# Patient Record
Sex: Female | Born: 1945 | Race: White | Hispanic: No | Marital: Married | Smoking: Never smoker
Health system: Southern US, Community
[De-identification: ages and names within clinical notes are randomized; demographics above are authoritative.]

## PROBLEM LIST (undated history)

## (undated) DIAGNOSIS — I10 Essential (primary) hypertension: Secondary | ICD-10-CM

## (undated) DIAGNOSIS — T7840XA Allergy, unspecified, initial encounter: Secondary | ICD-10-CM

## (undated) DIAGNOSIS — E785 Hyperlipidemia, unspecified: Secondary | ICD-10-CM

## (undated) DIAGNOSIS — K219 Gastro-esophageal reflux disease without esophagitis: Secondary | ICD-10-CM

## (undated) DIAGNOSIS — K529 Noninfective gastroenteritis and colitis, unspecified: Secondary | ICD-10-CM

## (undated) DIAGNOSIS — G454 Transient global amnesia: Secondary | ICD-10-CM

## (undated) HISTORY — DX: Noninfective gastroenteritis and colitis, unspecified: K52.9

## (undated) HISTORY — DX: Essential (primary) hypertension: I10

## (undated) HISTORY — PX: TONSILLECTOMY: SUR1361

## (undated) HISTORY — DX: Transient global amnesia: G45.4

## (undated) HISTORY — DX: Gastro-esophageal reflux disease without esophagitis: K21.9

## (undated) HISTORY — PX: CATARACT EXTRACTION: SUR2

## (undated) HISTORY — DX: Allergy, unspecified, initial encounter: T78.40XA

## (undated) HISTORY — DX: Hyperlipidemia, unspecified: E78.5

---

## 1998-03-01 ENCOUNTER — Other Ambulatory Visit: Admission: RE | Admit: 1998-03-01 | Discharge: 1998-03-01 | Payer: Self-pay | Admitting: Gynecology

## 1999-05-29 ENCOUNTER — Other Ambulatory Visit: Admission: RE | Admit: 1999-05-29 | Discharge: 1999-05-29 | Payer: Self-pay | Admitting: Gynecology

## 1999-06-26 ENCOUNTER — Other Ambulatory Visit: Admission: RE | Admit: 1999-06-26 | Discharge: 1999-06-26 | Payer: Self-pay | Admitting: Gynecology

## 1999-06-26 ENCOUNTER — Encounter (INDEPENDENT_AMBULATORY_CARE_PROVIDER_SITE_OTHER): Payer: Self-pay

## 2000-05-28 ENCOUNTER — Other Ambulatory Visit: Admission: RE | Admit: 2000-05-28 | Discharge: 2000-05-28 | Payer: Self-pay | Admitting: Gynecology

## 2000-08-17 ENCOUNTER — Encounter: Payer: Self-pay | Admitting: Occupational Medicine

## 2000-08-17 ENCOUNTER — Encounter: Admission: RE | Admit: 2000-08-17 | Discharge: 2000-08-17 | Payer: Self-pay | Admitting: Occupational Medicine

## 2000-11-02 ENCOUNTER — Other Ambulatory Visit: Admission: RE | Admit: 2000-11-02 | Discharge: 2000-11-02 | Payer: Self-pay | Admitting: Gynecology

## 2001-08-18 ENCOUNTER — Encounter: Payer: Self-pay | Admitting: Occupational Medicine

## 2001-08-18 ENCOUNTER — Encounter: Admission: RE | Admit: 2001-08-18 | Discharge: 2001-08-18 | Payer: Self-pay | Admitting: Occupational Medicine

## 2002-08-31 ENCOUNTER — Encounter: Payer: Self-pay | Admitting: Occupational Medicine

## 2002-08-31 ENCOUNTER — Encounter: Admission: RE | Admit: 2002-08-31 | Discharge: 2002-08-31 | Payer: Self-pay | Admitting: Occupational Medicine

## 2002-10-04 ENCOUNTER — Other Ambulatory Visit: Admission: RE | Admit: 2002-10-04 | Discharge: 2002-10-04 | Payer: Self-pay | Admitting: Gynecology

## 2003-11-06 ENCOUNTER — Encounter: Admission: RE | Admit: 2003-11-06 | Discharge: 2003-11-06 | Payer: Self-pay | Admitting: Family Medicine

## 2003-11-21 ENCOUNTER — Observation Stay (HOSPITAL_COMMUNITY): Admission: EM | Admit: 2003-11-21 | Discharge: 2003-11-22 | Payer: Self-pay | Admitting: Emergency Medicine

## 2003-11-22 ENCOUNTER — Encounter (INDEPENDENT_AMBULATORY_CARE_PROVIDER_SITE_OTHER): Payer: Self-pay | Admitting: Specialist

## 2004-11-20 ENCOUNTER — Ambulatory Visit: Payer: Self-pay | Admitting: Family Medicine

## 2004-11-26 ENCOUNTER — Ambulatory Visit: Payer: Self-pay | Admitting: Family Medicine

## 2004-12-31 ENCOUNTER — Ambulatory Visit: Payer: Self-pay | Admitting: Family Medicine

## 2005-01-14 ENCOUNTER — Encounter: Admission: RE | Admit: 2005-01-14 | Discharge: 2005-01-14 | Payer: Self-pay | Admitting: Family Medicine

## 2005-10-30 ENCOUNTER — Ambulatory Visit: Payer: Self-pay | Admitting: Internal Medicine

## 2006-01-19 ENCOUNTER — Other Ambulatory Visit: Admission: RE | Admit: 2006-01-19 | Discharge: 2006-01-19 | Payer: Self-pay | Admitting: Gynecology

## 2006-01-19 ENCOUNTER — Encounter: Admission: RE | Admit: 2006-01-19 | Discharge: 2006-01-19 | Payer: Self-pay | Admitting: Gynecology

## 2006-01-29 ENCOUNTER — Ambulatory Visit: Payer: Self-pay | Admitting: Family Medicine

## 2006-01-30 ENCOUNTER — Ambulatory Visit: Payer: Self-pay | Admitting: Family Medicine

## 2007-02-11 ENCOUNTER — Encounter: Admission: RE | Admit: 2007-02-11 | Discharge: 2007-02-11 | Payer: Self-pay | Admitting: Gynecology

## 2007-02-17 ENCOUNTER — Other Ambulatory Visit: Admission: RE | Admit: 2007-02-17 | Discharge: 2007-02-17 | Payer: Self-pay | Admitting: Gynecology

## 2007-02-19 ENCOUNTER — Encounter: Admission: RE | Admit: 2007-02-19 | Discharge: 2007-02-19 | Payer: Self-pay | Admitting: Gynecology

## 2007-03-04 ENCOUNTER — Ambulatory Visit: Payer: Self-pay | Admitting: Family Medicine

## 2007-03-12 ENCOUNTER — Ambulatory Visit: Payer: Self-pay | Admitting: Family Medicine

## 2007-03-12 LAB — CONVERTED CEMR LAB
ALT: 21 units/L (ref 0–40)
Alkaline Phosphatase: 57 units/L (ref 39–117)
BUN: 18 mg/dL (ref 6–23)
Bilirubin, Direct: 0.1 mg/dL (ref 0.0–0.3)
Calcium: 9.2 mg/dL (ref 8.4–10.5)
GFR calc Af Amer: 94 mL/min
GFR calc non Af Amer: 78 mL/min
Glucose, Bld: 99 mg/dL (ref 70–99)
Total CHOL/HDL Ratio: 3.7
Triglycerides: 135 mg/dL (ref 0–149)
VLDL: 27 mg/dL (ref 0–40)

## 2007-05-31 ENCOUNTER — Ambulatory Visit: Payer: Self-pay | Admitting: Family Medicine

## 2007-05-31 DIAGNOSIS — G43009 Migraine without aura, not intractable, without status migrainosus: Secondary | ICD-10-CM | POA: Insufficient documentation

## 2007-05-31 DIAGNOSIS — E785 Hyperlipidemia, unspecified: Secondary | ICD-10-CM

## 2007-05-31 DIAGNOSIS — I10 Essential (primary) hypertension: Secondary | ICD-10-CM | POA: Insufficient documentation

## 2007-05-31 DIAGNOSIS — J309 Allergic rhinitis, unspecified: Secondary | ICD-10-CM | POA: Insufficient documentation

## 2007-06-02 ENCOUNTER — Encounter: Payer: Self-pay | Admitting: Family Medicine

## 2007-08-11 ENCOUNTER — Telehealth (INDEPENDENT_AMBULATORY_CARE_PROVIDER_SITE_OTHER): Payer: Self-pay | Admitting: *Deleted

## 2007-09-08 ENCOUNTER — Ambulatory Visit: Payer: Self-pay | Admitting: Family Medicine

## 2007-09-09 LAB — CONVERTED CEMR LAB
AST: 21 units/L (ref 0–37)
Alkaline Phosphatase: 61 units/L (ref 39–117)
Cholesterol: 162 mg/dL (ref 0–200)
Total Bilirubin: 0.7 mg/dL (ref 0.3–1.2)
Total CHOL/HDL Ratio: 2.8
Total Protein: 6.5 g/dL (ref 6.0–8.3)
Triglycerides: 141 mg/dL (ref 0–149)

## 2007-09-10 ENCOUNTER — Telehealth (INDEPENDENT_AMBULATORY_CARE_PROVIDER_SITE_OTHER): Payer: Self-pay | Admitting: *Deleted

## 2007-12-27 ENCOUNTER — Encounter: Payer: Self-pay | Admitting: Family Medicine

## 2008-02-25 ENCOUNTER — Encounter: Admission: RE | Admit: 2008-02-25 | Discharge: 2008-02-25 | Payer: Self-pay | Admitting: Family Medicine

## 2008-02-29 ENCOUNTER — Encounter (INDEPENDENT_AMBULATORY_CARE_PROVIDER_SITE_OTHER): Payer: Self-pay | Admitting: *Deleted

## 2008-03-23 ENCOUNTER — Ambulatory Visit: Payer: Self-pay | Admitting: Internal Medicine

## 2008-04-13 ENCOUNTER — Other Ambulatory Visit: Admission: RE | Admit: 2008-04-13 | Discharge: 2008-04-13 | Payer: Self-pay | Admitting: Gynecology

## 2008-07-31 ENCOUNTER — Ambulatory Visit: Payer: Self-pay | Admitting: Family Medicine

## 2008-07-31 DIAGNOSIS — R5383 Other fatigue: Secondary | ICD-10-CM

## 2008-07-31 DIAGNOSIS — R5381 Other malaise: Secondary | ICD-10-CM

## 2008-08-08 ENCOUNTER — Encounter (INDEPENDENT_AMBULATORY_CARE_PROVIDER_SITE_OTHER): Payer: Self-pay | Admitting: *Deleted

## 2008-11-23 IMAGING — MG MM SCREEN MAMMOGRAM BILATERAL
4 series · 4 of 4 positions shown · non-contrast
Comparison: none

DG SCREEN MAMMOGRAM BILATERAL
Bilateral CC and MLO view(s) were taken.
Technologist: Alabarca, Ayleen(MARQUIS)

DIGITAL SCREENING MAMMOGRAM WITH CAD:
There is a dense  fibroglandular pattern.  A possible mass is noted in the right breast.  Spot 
compression views and possibly sonography are recommended for further evaluation.  In the left 
breast, no masses or malignant type calcifications are identified.  Compared with prior studies.

[R CC]
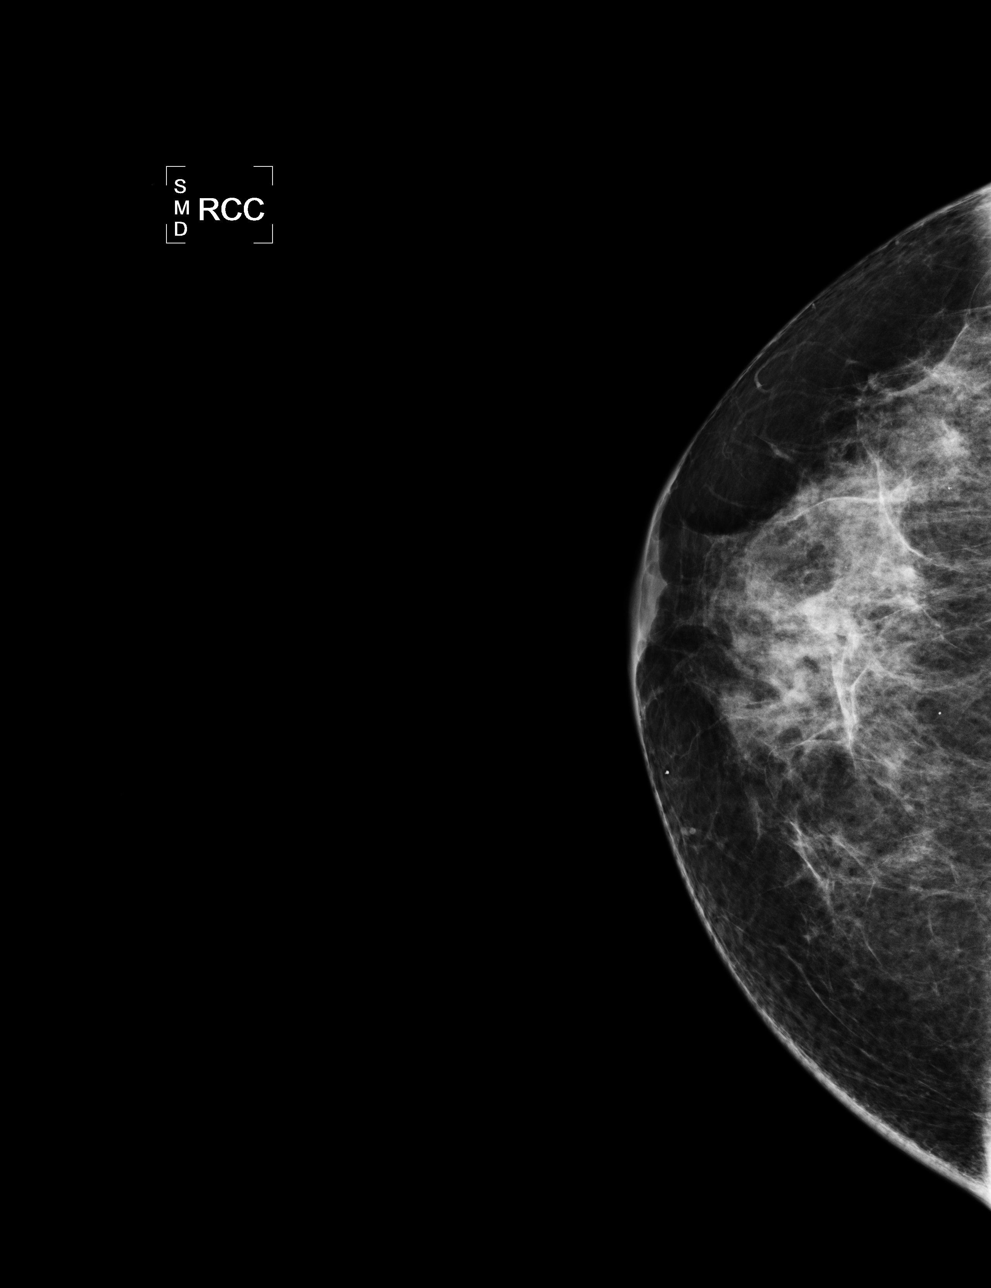

[L CC]
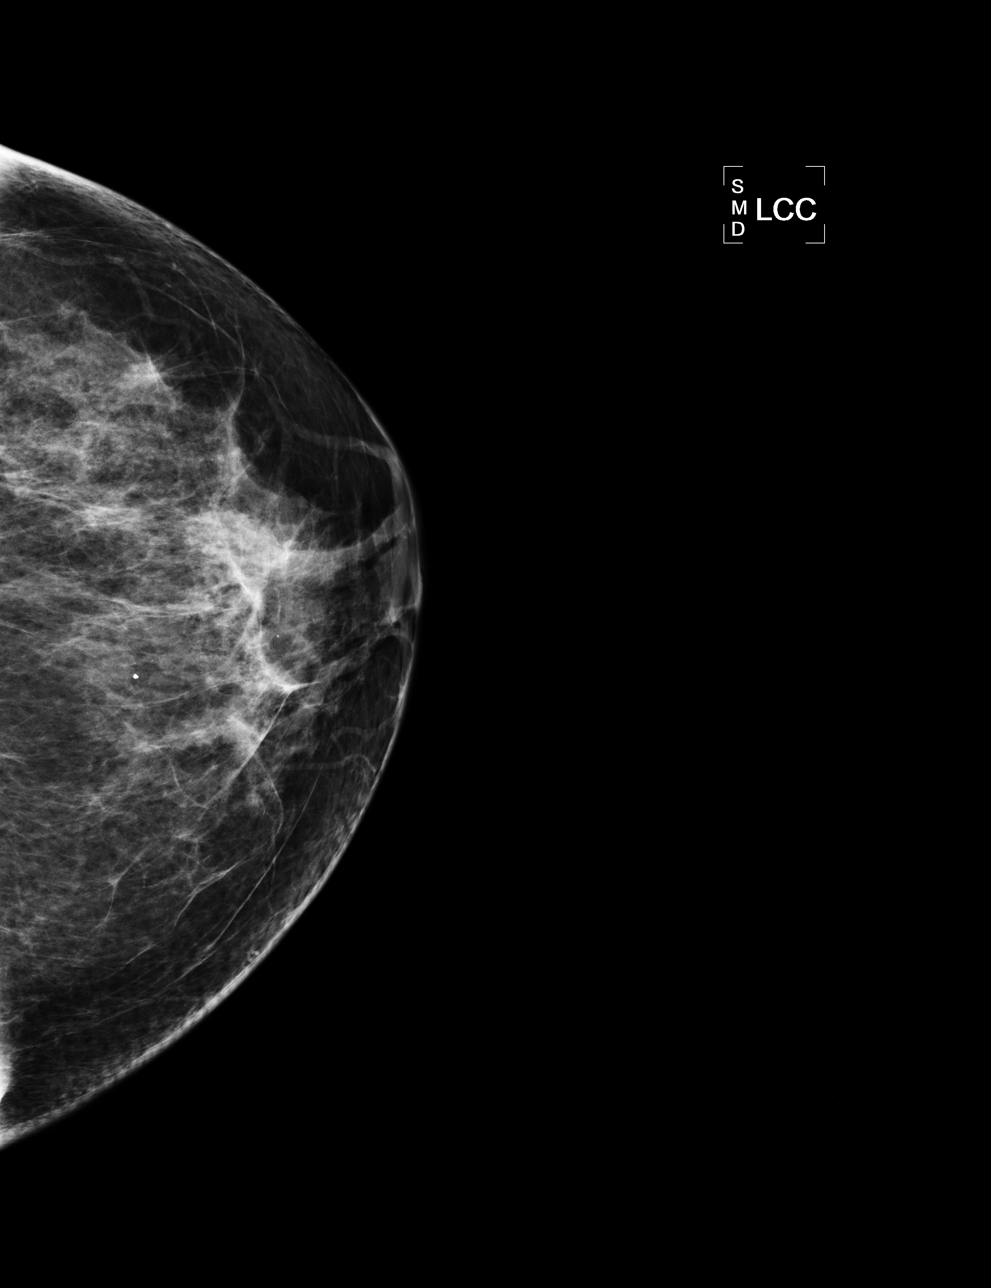

[L MLO]
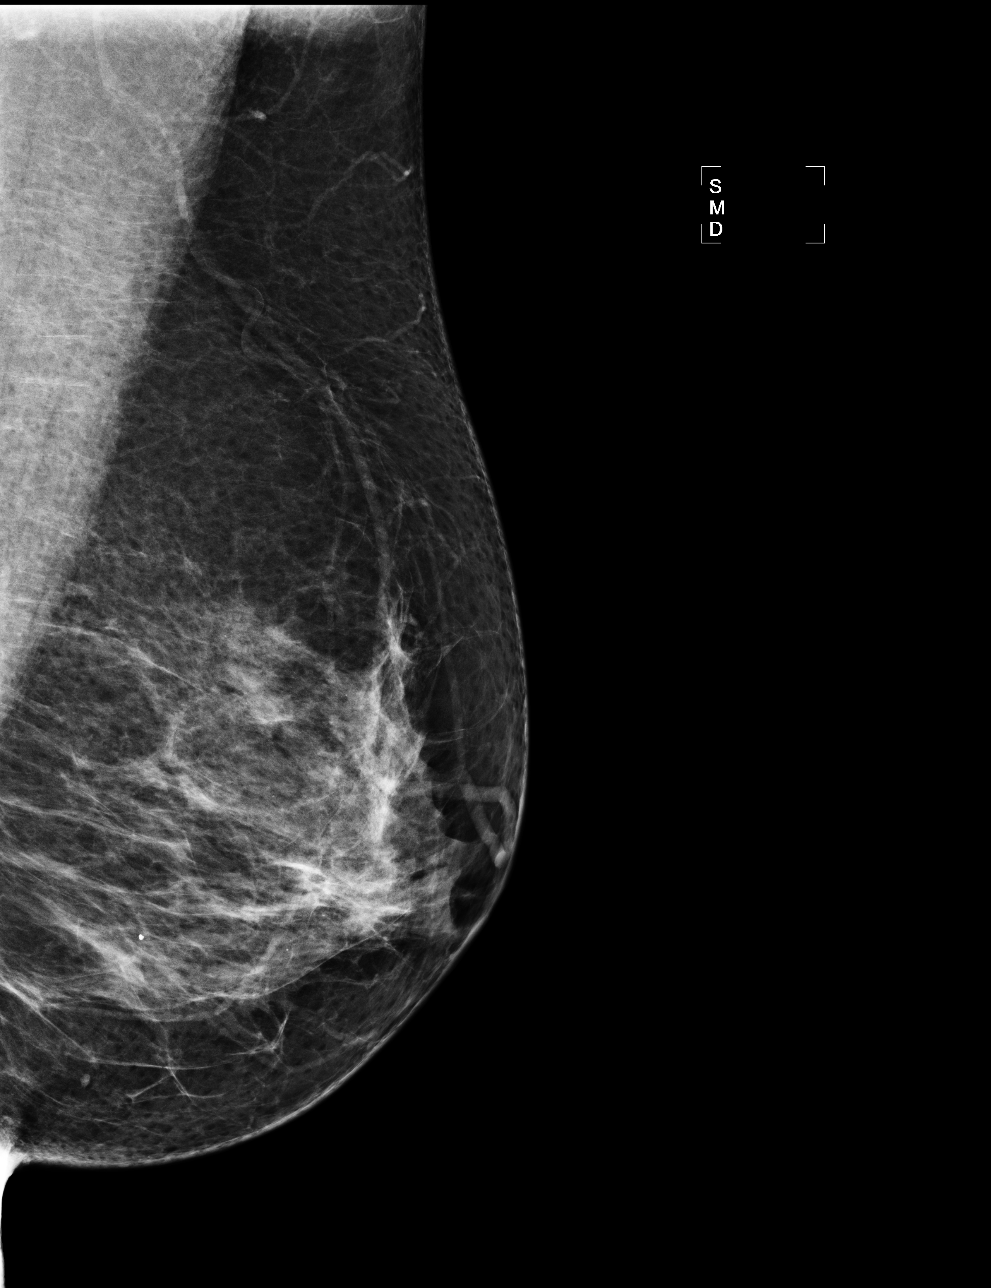

[R MLO]
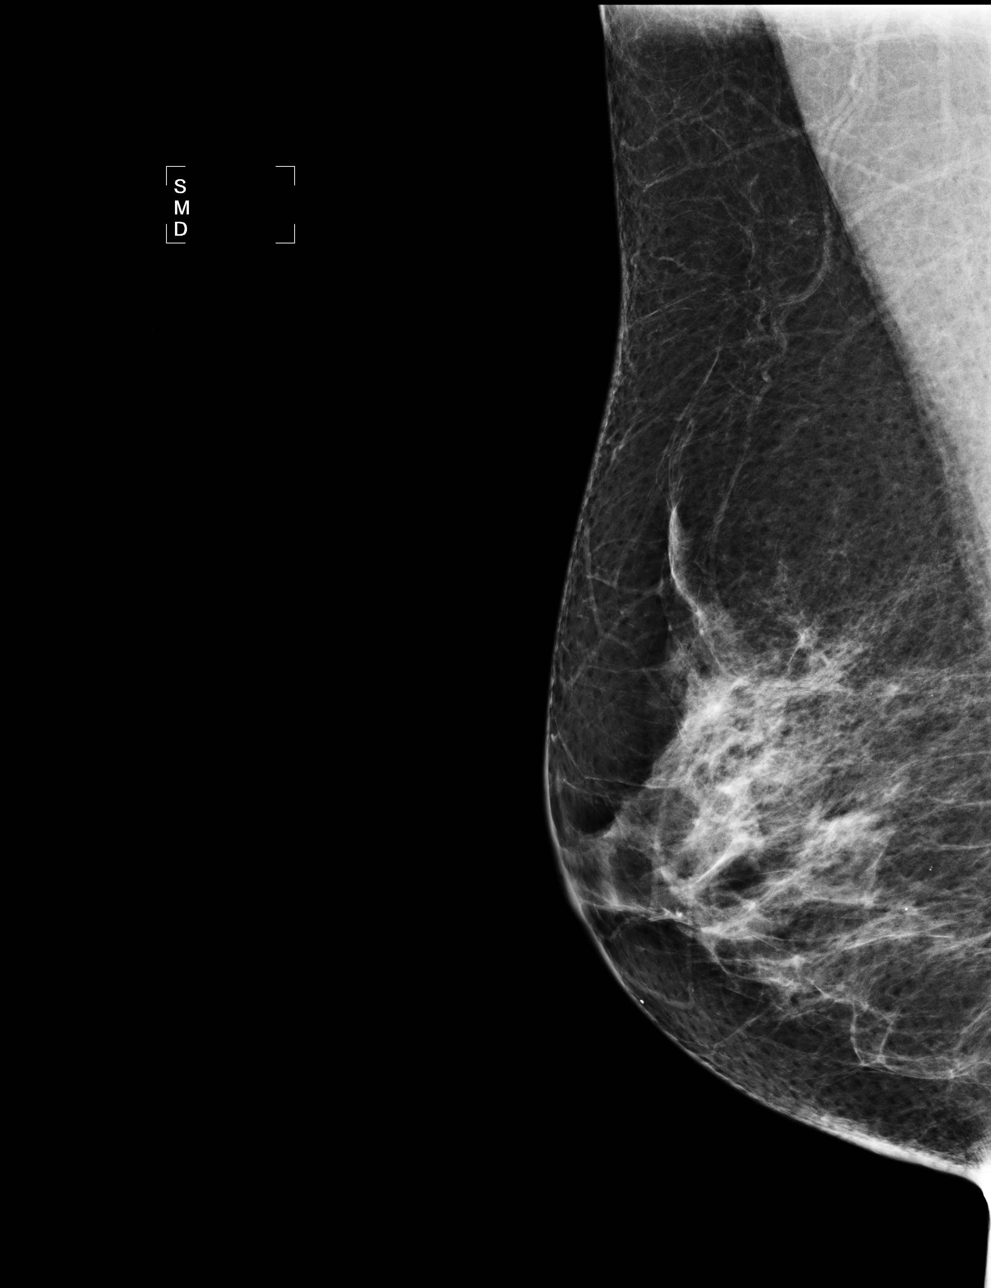

[4 of 4 positions shown; findings below may reference images not displayed]

IMPRESSION: Possible mass, right breast.  Additional evaluation is indicated.  The patient will be contacted 
for additional studies and a supplementary report will follow.  No specific mammographic evidence 
of malignancy, left breast.

ASSESSMENT: Need additional imaging evaluation and/or prior mammograms for comparison - BI-RADS 0

Further imaging of the right breast.
ANALYZED BY COMPUTER AIDED DETECTION. , THIS PROCEDURE WAS A DIGITAL MAMMOGRAM.

## 2008-11-28 ENCOUNTER — Telehealth (INDEPENDENT_AMBULATORY_CARE_PROVIDER_SITE_OTHER): Payer: Self-pay | Admitting: *Deleted

## 2009-03-02 ENCOUNTER — Telehealth (INDEPENDENT_AMBULATORY_CARE_PROVIDER_SITE_OTHER): Payer: Self-pay | Admitting: *Deleted

## 2009-03-07 ENCOUNTER — Encounter: Admission: RE | Admit: 2009-03-07 | Discharge: 2009-03-07 | Payer: Self-pay | Admitting: Gynecology

## 2009-04-16 ENCOUNTER — Ambulatory Visit: Payer: Self-pay | Admitting: Family Medicine

## 2009-04-18 LAB — CONVERTED CEMR LAB
ALT: 20 units/L (ref 0–35)
Bilirubin, Direct: 0.1 mg/dL (ref 0.0–0.3)
HDL: 63.2 mg/dL (ref >39.00)
LDL Cholesterol: 76 mg/dL (ref 0–99)
Total Bilirubin: 0.9 mg/dL (ref 0.3–1.2)
Total CHOL/HDL Ratio: 3
VLDL: 26.4 mg/dL (ref 0.0–40.0)

## 2009-04-19 ENCOUNTER — Encounter (INDEPENDENT_AMBULATORY_CARE_PROVIDER_SITE_OTHER): Payer: Self-pay | Admitting: *Deleted

## 2009-07-10 ENCOUNTER — Telehealth (INDEPENDENT_AMBULATORY_CARE_PROVIDER_SITE_OTHER): Payer: Self-pay | Admitting: *Deleted

## 2009-08-03 ENCOUNTER — Ambulatory Visit: Payer: Self-pay | Admitting: Family Medicine

## 2009-08-13 ENCOUNTER — Encounter: Payer: Self-pay | Admitting: Family Medicine

## 2009-09-03 ENCOUNTER — Telehealth (INDEPENDENT_AMBULATORY_CARE_PROVIDER_SITE_OTHER): Payer: Self-pay | Admitting: *Deleted

## 2009-10-10 ENCOUNTER — Telehealth (INDEPENDENT_AMBULATORY_CARE_PROVIDER_SITE_OTHER): Payer: Self-pay | Admitting: *Deleted

## 2009-10-22 ENCOUNTER — Telehealth (INDEPENDENT_AMBULATORY_CARE_PROVIDER_SITE_OTHER): Payer: Self-pay | Admitting: *Deleted

## 2009-11-16 ENCOUNTER — Ambulatory Visit: Payer: Self-pay | Admitting: Family Medicine

## 2009-11-27 ENCOUNTER — Telehealth (INDEPENDENT_AMBULATORY_CARE_PROVIDER_SITE_OTHER): Payer: Self-pay | Admitting: *Deleted

## 2010-02-15 ENCOUNTER — Ambulatory Visit: Payer: Self-pay | Admitting: Family Medicine

## 2010-02-18 LAB — CONVERTED CEMR LAB
Albumin: 4.1 g/dL (ref 3.5–5.2)
Alkaline Phosphatase: 57 units/L (ref 39–117)
Chloride: 103 meq/L (ref 96–112)
Cholesterol: 161 mg/dL (ref 0–200)
Creatinine, Ser: 0.8 mg/dL (ref 0.4–1.2)
HDL: 65.9 mg/dL (ref >39.00)
LDL Cholesterol: 65 mg/dL (ref 0–99)
Potassium: 3.8 meq/L (ref 3.5–5.1)
Sodium: 141 meq/L (ref 135–145)
Total Protein: 7 g/dL (ref 6.0–8.3)
Triglycerides: 150 mg/dL — ABNORMAL HIGH (ref 0.0–149.0)
VLDL: 30 mg/dL (ref 0.0–40.0)

## 2010-03-12 ENCOUNTER — Encounter: Admission: RE | Admit: 2010-03-12 | Discharge: 2010-03-12 | Payer: Self-pay | Admitting: Family Medicine

## 2010-07-12 ENCOUNTER — Encounter (INDEPENDENT_AMBULATORY_CARE_PROVIDER_SITE_OTHER): Payer: Self-pay | Admitting: *Deleted

## 2010-08-22 ENCOUNTER — Encounter: Payer: Self-pay | Admitting: Family Medicine

## 2010-08-22 ENCOUNTER — Ambulatory Visit: Payer: Self-pay | Admitting: Family Medicine

## 2010-08-28 ENCOUNTER — Ambulatory Visit: Payer: Self-pay | Admitting: Family Medicine

## 2010-08-28 LAB — CONVERTED CEMR LAB
ALT: 22 units/L (ref 0–35)
AST: 24 units/L (ref 0–37)
Albumin: 4.1 g/dL (ref 3.5–5.2)
BUN: 16 mg/dL (ref 6–23)
Basophils Relative: 0.5 % (ref 0.0–3.0)
Chloride: 100 meq/L (ref 96–112)
Eosinophils Relative: 2.2 % (ref 0.0–5.0)
Glucose, Bld: 94 mg/dL (ref 70–99)
HDL: 60.4 mg/dL (ref >39.00)
Hemoglobin: 12.3 g/dL (ref 12.0–15.0)
LDL Cholesterol: 80 mg/dL (ref 0–99)
MCV: 84.7 fL (ref 78.0–100.0)
Monocytes Absolute: 0.4 10*3/uL (ref 0.1–1.0)
Neutrophils Relative %: 60.5 % (ref 43.0–77.0)
Potassium: 4 meq/L (ref 3.5–5.1)
RBC: 4.15 M/uL (ref 3.87–5.11)
TSH: 1.81 microintl units/mL (ref 0.35–5.50)
Total CHOL/HDL Ratio: 3
Triglycerides: 160 mg/dL — ABNORMAL HIGH (ref 0.0–149.0)
VLDL: 32 mg/dL (ref 0.0–40.0)
WBC: 4.7 10*3/uL (ref 4.5–10.5)

## 2010-11-24 ENCOUNTER — Encounter: Payer: Self-pay | Admitting: Gynecology

## 2010-12-01 LAB — CONVERTED CEMR LAB
ALT: 20 units/L (ref 0–35)
AST: 21 units/L (ref 0–37)
Albumin: 4.1 g/dL (ref 3.5–5.2)
Albumin: 4.1 g/dL (ref 3.5–5.2)
Alkaline Phosphatase: 60 units/L (ref 39–117)
Alkaline Phosphatase: 66 units/L (ref 39–117)
BUN: 13 mg/dL (ref 6–23)
BUN: 16 mg/dL (ref 6–23)
BUN: 17 mg/dL (ref 6–23)
Basophils Absolute: 0 10*3/uL (ref 0.0–0.1)
Basophils Absolute: 0 10*3/uL (ref 0.0–0.1)
Basophils Absolute: 0 10*3/uL (ref 0.0–0.1)
Basophils Relative: 0.6 % (ref 0.0–3.0)
Bilirubin Urine: NEGATIVE
Bilirubin Urine: NEGATIVE
Bilirubin, Direct: 0 mg/dL (ref 0.0–0.3)
Bilirubin, Direct: 0.1 mg/dL (ref 0.0–0.3)
CO2: 31 meq/L (ref 19–32)
Chloride: 105 meq/L (ref 96–112)
Chloride: 111 meq/L (ref 96–112)
Cholesterol: 170 mg/dL (ref 0–200)
Cholesterol: 174 mg/dL (ref 0–200)
Cholesterol: 224 mg/dL (ref 0–200)
Creatinine, Ser: 0.8 mg/dL (ref 0.4–1.2)
Direct LDL: 134.6 mg/dL
Eosinophils Absolute: 0.1 10*3/uL (ref 0.0–0.6)
Eosinophils Absolute: 0.1 10*3/uL (ref 0.0–0.7)
Eosinophils Relative: 2.3 % (ref 0.0–5.0)
GFR calc Af Amer: 110 mL/min
GFR calc Af Amer: 82 mL/min
GFR calc non Af Amer: 91 mL/min
Glucose, Bld: 106 mg/dL — ABNORMAL HIGH (ref 70–99)
Glucose, Bld: 91 mg/dL (ref 70–99)
Glucose, Urine, Semiquant: NEGATIVE
Glucose, Urine, Semiquant: NEGATIVE
HCT: 37.3 % (ref 36.0–46.0)
Hemoglobin: 12.9 g/dL (ref 12.0–15.0)
Hemoglobin: 12.9 g/dL (ref 12.0–15.0)
Ketones, urine, test strip: NEGATIVE
LDL Cholesterol: 79 mg/dL (ref 0–99)
Lymphocytes Relative: 24.1 % (ref 12.0–46.0)
Lymphocytes Relative: 25.5 % (ref 12.0–46.0)
Lymphs Abs: 1.2 10*3/uL (ref 0.7–4.0)
MCHC: 34.4 g/dL (ref 30.0–36.0)
MCHC: 34.6 g/dL (ref 30.0–36.0)
MCV: 85.3 fL (ref 78.0–100.0)
MCV: 86 fL (ref 78.0–100.0)
Monocytes Absolute: 0.4 10*3/uL (ref 0.1–1.0)
Monocytes Absolute: 0.4 10*3/uL (ref 0.2–0.7)
Monocytes Relative: 8.9 % (ref 3.0–11.0)
Neutro Abs: 2.5 10*3/uL (ref 1.4–7.7)
Neutro Abs: 3.1 10*3/uL (ref 1.4–7.7)
Neutrophils Relative %: 61.9 % (ref 43.0–77.0)
Neutrophils Relative %: 63.9 % (ref 43.0–77.0)
Pap Smear: NORMAL
Platelets: 214 10*3/uL (ref 150.0–400.0)
Potassium: 3.6 meq/L (ref 3.5–5.1)
Potassium: 3.9 meq/L (ref 3.5–5.1)
Potassium: 4 meq/L (ref 3.5–5.1)
Protein, U semiquant: NEGATIVE
Protein, U semiquant: NEGATIVE
RBC: 4.41 M/uL (ref 3.87–5.11)
RDW: 13 % (ref 11.5–14.6)
RDW: 13.1 % (ref 11.5–14.6)
Sodium: 143 meq/L (ref 135–145)
TSH: 1.87 microintl units/mL (ref 0.35–5.50)
Total Bilirubin: 0.8 mg/dL (ref 0.3–1.2)
Total CHOL/HDL Ratio: 4
Total Protein: 6.8 g/dL (ref 6.0–8.3)
Triglycerides: 153 mg/dL — ABNORMAL HIGH (ref 0.0–149.0)
Urobilinogen, UA: NEGATIVE
VLDL: 30.6 mg/dL (ref 0.0–40.0)
Vit D, 1,25-Dihydroxy: 28 — ABNORMAL LOW (ref 30–89)
WBC: 4.7 10*3/uL (ref 4.5–10.5)
pH: 5
pH: 5

## 2010-12-05 NOTE — Assessment & Plan Note (Signed)
Summary: FU/BP/KDC   Vital Signs:  Patient profile:   65 year old female Weight:      193 pounds Temp:     98.2 degrees F oral Pulse rate:   80 / minute Pulse rhythm:   regular BP sitting:   122 / 84  (left arm) Cuff size:   large  Vitals Entered By: Army Fossa CMA (November 16, 2009 2:11 PM) CC: follow up on BP meds    History of Present Illness:  Hypertension follow-up      This is a 65 year old woman who presents for Hypertension follow-up.  The patient denies lightheadedness, urinary frequency, headaches, edema, impotence, rash, and fatigue.  The patient denies the following associated symptoms: chest pain, chest pressure, exercise intolerance, dyspnea, palpitations, syncope, leg edema, and pedal edema.  Compliance with medications (by patient report) has been near 100%.  The patient reports that dietary compliance has been good.  The patient reports exercising occasionally.  Adjunctive measures currently used by the patient include salt restriction.    Current Medications (verified): 1)  Diovan Hct 80-12.5 Mg Tabs (Valsartan-Hydrochlorothiazide) .... Take 1 By Mouth Once Daily 2)  Xyzal 5 Mg  Tabs (Levocetirizine Dihydrochloride) .Marland Kitchen.. 1 By Mouth Once Daily 3)  Wellbutrin Xl 300 Mg  Tb24 (Bupropion Hcl) .Marland Kitchen.. 1 Once Daily 4)  Imitrex 100 Mg  Tabs (Sumatriptan Succinate) .... As Needed 5)  Zocor 40 Mg  Tabs (Simvastatin) .Marland Kitchen.. 1 By Mouth At Bedtime- Due Labs 6)  Kapidex 60 Mg Cpdr (Dexlansoprazole) .Marland Kitchen.. 1 By Mouth Once Daily 7)  Tricor 145 Mg Tabs (Fenofibrate) .... Take One Tablet Daily 8)  Vitamin D 86578 Unit Caps (Ergocalciferol) .... Take One Capsule Weekly. 9)  Allegra 180 Mg Tabs (Fexofenadine Hcl) .... Take 1 By Mouth Once Daily  Allergies (verified): No Known Drug Allergies  Past History:  Past Medical History: Last updated: 07/31/2008 Hyperlipidemia Hypertension Allergic rhinitis Current Problems:  ALLERGIC RHINITIS (ICD-477.9) FAMILY HISTORY DIABETES 1ST  DEGREE RELATIVE (ICD-V18.0) FAMILY HISTORY OF CAD FEMALE 1ST DEGREE RELATIVE <60 (ICD-V16.49) COMMON MIGRAINE (ICD-346.10) HYPERTENSION (ICD-401.9) HYPERLIPIDEMIA (ICD-272.4)  Past Surgical History: Last updated: 07/31/2008  Cataract extraction--- B/L  Family History: Last updated: 05/31/2007 Family History of CAD Female 1st degree relative <60 Family History Diabetes 1st degree relative Family History High cholesterol Family History Hypertension PGM- breast CA in 51s  Social History: Last updated: 05/31/2007 Occupation: Event organiser apt. housing Married Never Smoked Alcohol use-no Drug use-no Regular exercise-yes  Risk Factors: Alcohol Use: 0 (08/03/2009) Caffeine Use: 1 (08/03/2009) Exercise: yes (08/03/2009)  Risk Factors: Smoking Status: never (08/03/2009) Passive Smoke Exposure: no (05/31/2007)  Review of Systems      See HPI  Physical Exam  General:  Well-developed,well-nourished,in no acute distress; alert,appropriate and cooperative throughout examination Lungs:  Normal respiratory effort, chest expands symmetrically. Lungs are clear to auscultation, no crackles or wheezes. Heart:  normal rate and no murmur.   Extremities:  No clubbing, cyanosis, edema, or deformity noted with normal full range of motion of all joints.   Psych:  Oriented X3 and normally interactive.     Impression & Recommendations:  Problem # 1:  HYPERTENSION (ICD-401.9)  Her updated medication list for this problem includes:    Diovan Hct 80-12.5 Mg Tabs (Valsartan-hydrochlorothiazide) .Marland Kitchen... Take 1 by mouth once daily  BP today: 122/84 Prior BP: 140/90 (08/03/2009)  Labs Reviewed: K+: 3.6 (08/03/2009) Creat: : 0.8 (08/03/2009)   Chol: 174 (08/03/2009)   HDL: 59.80 (08/03/2009)   LDL: 84 (08/03/2009)  TG: 153.0 (08/03/2009)  Complete Medication List: 1)  Diovan Hct 80-12.5 Mg Tabs (Valsartan-hydrochlorothiazide) .... Take 1 by mouth once daily 2)  Xyzal 5 Mg Tabs (Levocetirizine  dihydrochloride) .Marland Kitchen.. 1 by mouth once daily 3)  Wellbutrin Xl 300 Mg Tb24 (Bupropion hcl) .Marland Kitchen.. 1 once daily 4)  Imitrex 100 Mg Tabs (Sumatriptan succinate) .... As needed 5)  Zocor 40 Mg Tabs (Simvastatin) .Marland Kitchen.. 1 by mouth at bedtime- due labs 6)  Kapidex 60 Mg Cpdr (Dexlansoprazole) .Marland Kitchen.. 1 by mouth once daily 7)  Tricor 145 Mg Tabs (Fenofibrate) .... Take one tablet daily 8)  Vitamin D 04540 Unit Caps (Ergocalciferol) .... Take one capsule weekly. 9)  Allegra 180 Mg Tabs (Fexofenadine hcl) .... Take 1 by mouth once daily  Other Orders: Zoster (Shingles) Vaccine Live [phone]) Admin 1st Vaccine (14782)  Patient Instructions: 1)  rto 3 months Prescriptions: DIOVAN HCT 80-12.5 MG TABS (VALSARTAN-HYDROCHLOROTHIAZIDE) Take 1 by mouth once daily  #90 x 3   Entered and Authorized by:   Loreen Freud DO   Signed by:   Loreen Freud DO on 11/16/2009   Method used:   [facility]        [facility]  [address] [phone][facility] (retail[facility]       [address]       [place], [place]  [id number]       Ph: [phone] or [phone]       Fax: [phone]   RxID:   [id number]    Immunizations Administered:  Zostavax # 1:    Vaccine Type: Zostavax    Site: left deltoid    Mfr: Merck    Dose: 0.5 ml    Route: [place]    Given by: Army Fossa CMA    Exp. Date: 11/30/2010    Lot #: [id number]

## 2010-12-05 NOTE — Letter (Signed)
Summary: Cancer Screening/[facility] Personalized Risk Profile  Cancer Screening/[facility] Personalized Risk Profile   Imported By: Lanelle Bal 09/02/2010 08:34:00  _____________________________________________________________________  External Attachment:    Type:   Image     Comment:   External Document

## 2010-12-05 NOTE — Progress Notes (Signed)
Summary: nausea, diarrhea  Phone Note Call from Patient   Caller: Patient Summary of Call: pt just recently return for cruise where she develop nausea, vomiting, and diarrhea while on the ship where she was treated. pt now states that the nausea, and diarrhea has returned. pt has tried pepto-bismol with little relief. Offer pt Ov pt decline stating that she feels to bad to come in. Advise pt to try imodium AD and align, and to avoid greasy/fatty food, dairy and to do a clear liquid diet. to see if this will help with her stomach. Inform pt will forward to dr Laury Axon for further advisement................Marland KitchenFelecia Deloach CMA  November 27, 2009 2:20 PM   Follow-up for Phone Call        travelers diarrhea usually requires testing and abx-- pt really should come in. Follow-up by: Loreen Freud DO,  November 27, 2009 4:37 PM  Additional Follow-up for Phone Call Additional follow up Details #1::        left message to call  office..............Marland KitchenFelecia Deloach CMA  November 27, 2009 4:58 PM  left message to call office............Marland KitchenFelecia Deloach CMA  November 28, 2009 2:35 PM     Additional Follow-up for Phone Call Additional follow up Details #2::    pt return call stating that she is much better now all symptoms have resolve but if they return she will come in for OV........................Marland KitchenFelecia Deloach CMA  November 29, 2009 8:12 AM

## 2010-12-05 NOTE — Assessment & Plan Note (Signed)
Summary: CPX///SPH   Vital Signs:  Patient profile:   65 year old female Height:      66.25 inches Weight:      191.6 pounds BMI:     30.80 Pulse rate:   78 / minute BP sitting:   138 / 74  (left arm)  Vitals Entered By: Jeremy Johann CMA (August 22, 2010 12:44 PM) CC: CPX   History of Present Illness: Robin Wall here for cpe and sees Dr Lorenso Courier for gyn exam.  No complaints.    Preventive Screening-Counseling & Management  Alcohol-Tobacco     Alcohol drinks/day: 0     Smoking Status: never     Passive Smoke Exposure: no  Caffeine-Diet-Exercise     Caffeine use/day: 2     Does Patient Exercise: yes     Type of exercise: walking     Exercise (avg: min/session): 30-60     Times/week: 3     Exercise Counseling: to improve exercise regimen  Hep-HIV-STD-Contraception     HIV Risk: no     Dental Visit-last 6 months yes     Dental Care Counseling: not indicated; dental care within six months     SBE monthly: yes     SBE Education/Counseling: not indicated; SBE done regularly     Sun Exposure-Excessive: no  Safety-Violence-Falls     Seat Belt Use: yes      Sexual History:  currently monogamous.    Current Medications (verified): 1)  Diovan Hct 80-12.5 Mg Tabs (Valsartan-Hydrochlorothiazide) .... Take 1 By Mouth Once Daily 2)  Xyzal 5 Mg  Tabs (Levocetirizine Dihydrochloride) .Marland Kitchen.. 1 By Mouth Once Daily 3)  Wellbutrin Xl 300 Mg  Tb24 (Bupropion Hcl) .Marland Kitchen.. 1 Once Daily*office Visit Due Now 4)  Imitrex 100 Mg  Tabs (Sumatriptan Succinate) .... As Needed 5)  Zocor 40 Mg  Tabs (Simvastatin) .Marland Kitchen.. 1 By Mouth At Bedtime. 6)  Flonase 50 Mcg/act Susp (Fluticasone Propionate) .... 2 Sprays Each Nostril Once Daily  Allergies (verified): No Known Drug Allergies  Past History:  Past Medical History: Last updated: 07/31/2008 Hyperlipidemia Hypertension Allergic rhinitis Current Problems:  ALLERGIC RHINITIS (ICD-477.9) FAMILY HISTORY DIABETES 1ST DEGREE RELATIVE (ICD-V18.0) FAMILY  HISTORY OF CAD FEMALE 1ST DEGREE RELATIVE <60 (ICD-V16.49) COMMON MIGRAINE (ICD-346.10) HYPERTENSION (ICD-401.9) HYPERLIPIDEMIA (ICD-272.4)  Past Surgical History: Last updated: 07/31/2008  Cataract extraction--- B/L  Family History: Last updated: 05/31/2007 Family History of CAD Female 1st degree relative <60 Family History Diabetes 1st degree relative Family History High cholesterol Family History Hypertension PGM- breast CA in 49s  Social History: Last updated: 05/31/2007 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 Family History of CAD Female 1st degree relative <60 Family History Diabetes 1st degree relative Family History High cholesterol Family History Hypertension PGM- breast CA in 7s  Social History: Reviewed history from 05/31/2007 and no changes required. 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 Eyes:  Denies blurring, discharge, double vision, eye irritation, eye pain, halos, itching, light sensitivity, red eye, vision loss-1 eye, and  vision loss-both eyes; optho-- q1y. ENT:  Denies decreased hearing, difficulty swallowing, ear discharge, earache, hoarseness, nasal congestion, nosebleeds, postnasal drainage, ringing in ears, sinus pressure, and sore throat. CV:  Denies bluish discoloration of lips or nails, chest pain or discomfort, difficulty breathing at  night, difficulty breathing while lying down, fainting, fatigue, leg cramps with exertion, lightheadness, near fainting, palpitations, shortness of breath with exertion, swelling of feet, swelling of hands, and weight gain. Resp:  Denies chest discomfort, chest pain with inspiration, cough, coughing up blood, excessive snoring, hypersomnolence, morning headaches, pleuritic, shortness of breath, sputum productive, and wheezing. GI:  Complains of abdominal pain; denies bloody stools, change in bowel habits, constipation, dark tarry stools, diarrhea, excessive appetite, gas, hemorrhoids, loss of appetite, and nausea; Robin Wall states her hiatal hernia acts up exertiion. GU:  Denies abnormal vaginal bleeding, decreased libido, discharge, dysuria, genital sores, hematuria, incontinence, nocturia, urinary frequency, and urinary hesitancy. MS:  Denies joint pain, joint redness, joint swelling, loss of strength, low back pain, mid back pain, muscle aches, muscle , cramps, muscle weakness, stiffness, and thoracic pain. Derm:  Denies changes in color of skin, changes in nail beds, dryness, excessive perspiration, flushing, hair loss, insect bite(s), itching, lesion(s), poor wound healing, and rash. Neuro:  Denies brief paralysis, difficulty with concentration, disturbances in coordination, falling down, headaches, inability to speak, memory loss, numbness, poor balance, seizures, sensation of room spinning, tingling, tremors, visual disturbances, and weakness. Psych:  Denies alternate hallucination ( auditory/visual), anxiety, depression, easily angered, easily tearful, irritability, mental problems, panic attacks, sense of great danger, suicidal thoughts/plans, thoughts of violence, unusual visions or sounds, and thoughts /plans of harming others. Endo:  Denies cold intolerance, excessive hunger, excessive thirst, excessive urination, heat intolerance, polyuria, and weight change. Heme:  Denies abnormal bruising, bleeding,  enlarge lymph nodes, fevers, pallor, and skin discoloration. Allergy:  Denies hives or rash, itching eyes, persistent infections, seasonal allergies, and sneezing.  Physical Exam  General:  Well-developed,well-nourished,in no acute distress; alert,appropriate and cooperative throughout examination Head:  Normocephalic and atraumatic without obvious abnormalities. No apparent alopecia or balding. Eyes:  vision grossly intact, pupils equal, pupils round, pupils reactive to light, and no injection.   Ears:  External ear exam shows no significant lesions or deformities.  Otoscopic examination reveals clear canals, tympanic membranes are intact bilaterally without bulging, retraction, inflammation or discharge. Hearing is grossly normal bilaterally. Nose:  External nasal examination shows no deformity or inflammation. Nasal mucosa are pink and moist without lesions or exudates. Mouth:  Oral mucosa and oropharynx without lesions or exudates.  Teeth in good repair. Neck:  No deformities, masses, or tenderness noted. Chest Wall:  No deformities, masses, or tenderness noted. Breasts:  gyn Lungs:  Normal respiratory effort, chest expands symmetrically. Lungs are clear to auscultation, no crackles or wheezes. Heart:  normal rate and no murmur.   Abdomen:  Bowel sounds positive,abdomen soft and non-tender without masses, organomegaly or hernias noted. Rectal:  gyn Genitalia:  gyn Msk:  normal ROM, no joint tenderness, no joint swelling, no joint warmth, no redness over joints, no joint deformities, no joint instability, and no crepitation.   Pulses:  R and L carotid,radial,femoral,dorsalis pedis and posterior tibial pulses are full and equal bilaterally Extremities:  No clubbing, cyanosis, edema, or deformity noted with normal full range of motion of all joints.   Neurologic:  No cranial nerve deficits noted. Station and gait are normal. Plantar reflexes are down-going bilaterally. DTRs are symmetrical  throughout. Sensory, motor and coordinative functions  appear intact. Skin:  Intact without suspicious lesions or rashes Cervical Nodes:  No lymphadenopathy noted Psych:  Cognition and judgment appear intact. Alert and cooperative with normal attention span and concentration. No apparent delusions, illusions, hallucinations   Impression & Recommendations:  Problem # 1:  PREVENTIVE HEALTH CARE (ICD-V70.0)  rto for labs ghm utd   Orders: EKG w/ Interpretation (93000)  Problem # 2:  HIATAL HERNIA (ICD-553.3)  Problem # 3:  HYPERTENSION (ICD-401.9)  Her updated medication list for this problem includes:    Diovan Hct 80-12.5 Mg Tabs (Valsartan-hydrochlorothiazide) .Marland Kitchen... Take 1 by mouth once daily  BP today: 138/74 Prior BP: 122/84 (11/16/2009)  Labs Reviewed: K+: 3.8 (02/15/2010) Creat: : 0.8 (02/15/2010)   Chol: 161 (02/15/2010)   HDL: 65.90 (02/15/2010)   LDL: 65 (02/15/2010)   TG: 150.0 (02/15/2010)  Orders: EKG w/ Interpretation (93000)  Problem # 4:  HYPERLIPIDEMIA (ICD-272.4)  The following medications were removed from the medication list:    Tricor 145 Mg Tabs (Fenofibrate) .Marland Kitchen... Take one tablet daily Her updated medication list for this problem includes:    Zocor 40 Mg Tabs (Simvastatin) .Marland Kitchen... 1 by mouth at bedtime.  Labs Reviewed: SGOT: 21 (02/15/2010)   SGPT: 21 (02/15/2010)   HDL:65.90 (02/15/2010), 59.80 (08/03/2009)  LDL:65 (02/15/2010), 84 (08/03/2009)  Chol:161 (02/15/2010), 174 (08/03/2009)  Trig:150.0 (02/15/2010), 153.0 (08/03/2009)  Orders: EKG w/ Interpretation (93000)  Complete Medication List: 1)  Diovan Hct 80-12.5 Mg Tabs (Valsartan-hydrochlorothiazide) .... Take 1 by mouth once daily 2)  Xyzal 5 Mg Tabs (Levocetirizine dihydrochloride) .Marland Kitchen.. 1 by mouth once daily 3)  Wellbutrin Xl 300 Mg Tb24 (Bupropion hcl) .Marland Kitchen.. 1 once daily*office visit due now 4)  Imitrex 100 Mg Tabs (Sumatriptan succinate) .... As needed 5)  Zocor 40 Mg Tabs (Simvastatin)  .Marland Kitchen.. 1 by mouth at bedtime. 6)  Flonase 50 Mcg/act Susp (Fluticasone propionate) .... 2 sprays each nostril once daily  Patient Instructions: 1)  V70.0  272.4  401.9   cbcd, bmp,hep, lipid, tsh, UA 2)  Please schedule a follow-up appointment in 6 months .  Prescriptions: FLONASE 50 MCG/ACT SUSP (FLUTICASONE PROPIONATE) 2 sprays each nostril once daily  #1 x 5   Entered and Authorized by:   Loreen Freud DO   Signed by:   Loreen Freud DO on 08/22/2010   Method used:   [facility]        [facility] [address] [phone]* (retail)       [address]       [place], [place]  [id number]       Ph: [phone] or [phone]       Fax: [phone]   RxID:   [id number]    Orders Added: 1)  Est. Patient 40-64 years [99396] 2)  EKG w/ Interpretation [93000]    Last Flu Vaccine:  Fluvax 3+ (08/03/2009 8:37:30 AM) Flu Vaccine Result Date:  07/30/2010 Flu Vaccine Result:  given Flu Vaccine Next Due:  1 yr Last Mammogram:  ASSESSMENT: Negative - BI-RADS 1^MM DIGITAL SCREENING (03/12/2010 3:09:00 PM) Mammogram Result Date:  03/12/2010 Mammogram Result:  normal Mammogram Next Due:  1 yr

## 2010-12-05 NOTE — Letter (Signed)
Summary: Primary Care Appointment Letter  [facility]  [address] [place], [place] [address]   Phone: [phone]  Fax: [phone]    07/12/2010 MRN: [id number]  ALEATHIA PURDY [address] [place], [place]  [address]  Dear Ms. Fredric Mare,   Your Primary Care Physician Loreen Freud DO has indicated that:    __X_____it is time to schedule an appointment.    _______you missed your appointment on______ and need to call and          reschedule.    _______you need to have lab work done.    _______you need to schedule an appointment discuss lab or test results.    _______you need to call to reschedule your appointment that is                       scheduled on _________.     Please call our office as soon as possible. Our phone number is [phone]. Please press option 1. Our office is open 8a-12noon and 1p-5p, Monday through Friday.     Thank you,    [facility]

## 2011-02-25 ENCOUNTER — Other Ambulatory Visit: Payer: Self-pay | Admitting: Gynecology

## 2011-02-25 DIAGNOSIS — Z1231 Encounter for screening mammogram for malignant neoplasm of breast: Secondary | ICD-10-CM

## 2011-02-26 ENCOUNTER — Other Ambulatory Visit: Payer: Self-pay

## 2011-02-26 MED ORDER — LEVOCETIRIZINE DIHYDROCHLORIDE 5 MG PO TABS: 5.0000 mg | ORAL_TABLET | Freq: Every evening | ORAL | Status: DC

## 2011-02-28 ENCOUNTER — Other Ambulatory Visit: Payer: Self-pay | Admitting: Family Medicine

## 2011-03-14 ENCOUNTER — Ambulatory Visit: Payer: Self-pay

## 2011-03-18 ENCOUNTER — Ambulatory Visit
Admission: RE | Admit: 2011-03-18 | Discharge: 2011-03-18 | Disposition: A | Discharge status: Home / Self Care | Payer: Federal, State, Local not specified - PPO | Source: Ambulatory Visit | Attending: Gynecology | Admitting: Gynecology

## 2011-03-18 DIAGNOSIS — Z1231 Encounter for screening mammogram for malignant neoplasm of breast: Secondary | ICD-10-CM

## 2011-03-21 ENCOUNTER — Telehealth: Payer: Self-pay | Admitting: Family Medicine

## 2011-03-21 MED ORDER — VALSARTAN-HYDROCHLOROTHIAZIDE 80-12.5 MG PO TABS: 1.0000 | ORAL_TABLET | Freq: Every day | ORAL | Status: DC

## 2011-03-21 NOTE — Telephone Encounter (Signed)
Rx faxed.    KP 

## 2011-03-21 NOTE — Telephone Encounter (Signed)
Needs 90 day supply of Diovan

## 2011-03-21 NOTE — Telephone Encounter (Signed)
Which medication did the pt state she needed?

## 2011-03-21 NOTE — H&P (Signed)
NAME:  Robin Wall                         ACCOUNT NO.:  [id number]   MEDICAL RECORD NO.:  [id number]                   PATIENT TYPE:  EMS   LOCATION:  ED                                   FACILITY:  [facility]   PHYSICIAN:  Barbette Hair. Arlyce Dice, M.D. [facility]          DATE OF BIRTH:  1946-04-06   DATE OF ADMISSION:  11/21/2003  DATE OF DISCHARGE:                                HISTORY & PHYSICAL   CHIEF COMPLAINT:  Abdominal cramping and rectal bleeding.   HISTORY:  Robin Wall is a pleasant 65 year old white female with a history of  hypertension and depression, otherwise in good health.  She had onset on  Sunday evening, November 19, 2003, with severe abdominal cramping which  occurred after she had gone to bed.  This was associated with diaphoresis  and then diarrhea which initially was non-bloody.  Eventually, the diarrhea  became bloody and had mucus, and she reports several episodes of small  volume bloody stools yesterday.  She was seen by Dr. Drue Novel in his office  yesterday.  Apparently, her hemoglobin was 14.  She was noted to have a  grossly bloody stool and was to get an urgent appointment with GI.  She  continued last evening with mucus containing, bloody stool which she felt  was increased in volume and, therefore, presented to the emergency room this  morning.  She has not had any stool thus far today.  Has been on liquids  over the past 24 hours.  She has had no documented fever, has had chills, no  nausea or vomiting.  Complains of feeling weak and has a mild headache.  She  continues to have abdominal cramping, although says this is not as severe as  at onset.  In retrospect, she did have an episode in December with abdominal  cramping and diarrhea which did not persist.  Her appetite has been fine.  Her weight has been stable.  She says she did take a course of an antibiotic  a few weeks ago for an upper respiratory infection.   LABS IN THE EMERGENCY ROOM:  WBC of 8.5, hemoglobin  13.7, hematocrit of 40,  MCV of 82, platelets 292.  Pro time 11.1, INR of 0.8.  Electrolytes normal  with potassium of 3.5, BUN 9, creatinine 0.8.  Liver function studies  normal.   She is admitted at this time for further evaluation of acute colitis.   CURRENT MEDICATIONS:  1. HCTZ/Triamterene 25/37.5 p.o. every day.  2. Claritin daily.  3. Nexium 40 p.o. every day.  4. Wellbutrin SR, she believes 150 mg every day.   ALLERGIES:  No known drug allergies.   PAST MEDICAL HISTORY:  As outlined above.   SOCIAL HISTORY:  The patient is married.  No tobacco.  No ETOH.  She is  employed with [employer].   FAMILY HISTORY:  Pertinent for a mother with stomach problems IBS.  Otherwise negative.  REVIEW OF SYSTEMS:  CARDIOVASCULAR:  Denies any chest pain or anginal  symptoms.  PULMONARY:  No cough, shortness of breath or sputum production.  GENITOURINARY:  No dysuria, urgency, or frequency.  MUSCULOSKELETAL:  Denies.   PHYSICAL EXAMINATION:  GENERAL:  A well developed white female in no acute  distress.  VITAL SIGNS:  Temp is 98.4, blood pressure 151/92, pulse is 97, respirations  20, sat is 95% on room air.  HEENT:  Nontraumatic, normocephalic.  EOMI.  PERLA.  Sclera anicteric.  NECK:  Supple.  CARDIOVASCULAR:  Regular, rate and rhythm with S1 and S2.  No murmur, rub or  gallop.  PULMONARY:  Clear to A&P.  ABDOMEN:  Soft.  Bowel sounds are active.  She is tender in the left upper  quadrant, left mid quadrant, left lower quadrant.  There is no guarding or  rebound.  No mass or hepatosplenomegaly.  RECTAL:  Not repeated today.  Mucus containing, red blood per Dr. Drue Novel  yesterday.  EXTREMITIES:  No clubbing, cyanosis or edema.   IMPRESSION:  63. A 65 year old white female with a two day history of acute abdominal     pain, cramping and rectal bleeding.  Suspect segmental ischemic colitis     versus other acute infectious colitis.  Doubt inflammatory bowel disease.  2. Hypertension.  3.  Depression.   PLAN:  The patient is admitted to observation.  We will check stool for C.  diff and C&S.  Keep her on a clear liquid diet.  She will undergo colon prep  this evening and colonoscopy on the morning of November 22, 2003.  For  details please see the orders.     Mike Gip, P.A.-C. [facility]                Robert D. Arlyce Dice, M.D. [facility]    AE/MEDQ  D:  11/21/2003  T:  11/21/2003  Job:  [id number]   cc:   Angelena Sole, M.D. [facility]

## 2011-03-21 NOTE — Telephone Encounter (Signed)
Patient says she called prescription in at beginning of week to [facility], [address] and they say they havent heard from us-----Patient has one pill left  Please call in a 90 day supply with refills

## 2011-03-27 ENCOUNTER — Other Ambulatory Visit: Payer: Self-pay | Admitting: Family Medicine

## 2011-04-23 ENCOUNTER — Ambulatory Visit (INDEPENDENT_AMBULATORY_CARE_PROVIDER_SITE_OTHER): Payer: Federal, State, Local not specified - PPO | Admitting: *Deleted

## 2011-04-23 DIAGNOSIS — Z0000 Encounter for general adult medical examination without abnormal findings: Secondary | ICD-10-CM

## 2011-04-23 DIAGNOSIS — Z23 Encounter for immunization: Secondary | ICD-10-CM

## 2011-04-26 ENCOUNTER — Other Ambulatory Visit: Payer: Self-pay | Admitting: Family Medicine

## 2011-05-20 ENCOUNTER — Ambulatory Visit: Payer: Federal, State, Local not specified - PPO | Admitting: Family Medicine

## 2011-05-20 ENCOUNTER — Encounter: Payer: Self-pay | Admitting: Family Medicine

## 2011-05-22 ENCOUNTER — Ambulatory Visit (INDEPENDENT_AMBULATORY_CARE_PROVIDER_SITE_OTHER): Payer: Federal, State, Local not specified - PPO | Admitting: Family Medicine

## 2011-05-22 ENCOUNTER — Other Ambulatory Visit: Payer: Self-pay | Admitting: Family Medicine

## 2011-05-22 ENCOUNTER — Encounter: Payer: Self-pay | Admitting: *Deleted

## 2011-05-22 ENCOUNTER — Encounter: Payer: Self-pay | Admitting: Family Medicine

## 2011-05-22 DIAGNOSIS — E785 Hyperlipidemia, unspecified: Secondary | ICD-10-CM

## 2011-05-22 DIAGNOSIS — J302 Other seasonal allergic rhinitis: Secondary | ICD-10-CM

## 2011-05-22 DIAGNOSIS — F329 Major depressive disorder, single episode, unspecified: Secondary | ICD-10-CM

## 2011-05-22 DIAGNOSIS — I10 Essential (primary) hypertension: Secondary | ICD-10-CM

## 2011-05-22 LAB — HEPATIC FUNCTION PANEL
Albumin: 4.6 g/dL (ref 3.5–5.2)
Alkaline Phosphatase: 67 U/L (ref 39–117)
Total Protein: 7.5 g/dL (ref 6.0–8.3)

## 2011-05-22 LAB — BASIC METABOLIC PANEL
BUN: 25 mg/dL — ABNORMAL HIGH (ref 6–23)
CO2: 27 mEq/L (ref 19–32)
Calcium: 9.3 mg/dL (ref 8.4–10.5)
Creatinine, Ser: 0.8 mg/dL (ref 0.4–1.2)
GFR: 81.21 mL/min (ref >60.00)
Glucose, Bld: 105 mg/dL — ABNORMAL HIGH (ref 70–99)
Sodium: 141 mEq/L (ref 135–145)

## 2011-05-22 LAB — LIPID PANEL
Cholesterol: 156 mg/dL (ref 0–200)
LDL Cholesterol: 69 mg/dL (ref 0–99)
Triglycerides: 120 mg/dL (ref 0.0–149.0)
VLDL: 24 mg/dL (ref 0.0–40.0)

## 2011-05-22 MED ORDER — FLUTICASONE PROPIONATE 50 MCG/ACT NA SUSP: 2.0000 | Freq: Every day | NASAL | Status: DC

## 2011-05-22 MED ORDER — SIMVASTATIN 40 MG PO TABS: 40.0000 mg | ORAL_TABLET | Freq: Every day | ORAL | Status: DC

## 2011-05-22 MED ORDER — VALSARTAN-HYDROCHLOROTHIAZIDE 80-12.5 MG PO TABS: 1.0000 | ORAL_TABLET | Freq: Every day | ORAL | Status: DC

## 2011-05-22 MED ORDER — BUPROPION HCL ER (XL) 300 MG PO TB24: 300.0000 mg | ORAL_TABLET | Freq: Every day | ORAL | Status: DC

## 2011-05-22 MED ORDER — LEVOCETIRIZINE DIHYDROCHLORIDE 5 MG PO TABS: 5.0000 mg | ORAL_TABLET | Freq: Every evening | ORAL | Status: DC

## 2011-05-22 NOTE — Progress Notes (Signed)
  Subjective:    Patient here for follow-up of elevated blood pressure.  She is exercising and is adherent to a low-salt diet.  Blood pressure is well controlled at home. Cardiac symptoms: none. Patient denies: chest pain, chest pressure/discomfort, dyspnea, fatigue, irregular heart beat, palpitations, paroxysmal nocturnal dyspnea and tachypnea. Cardiovascular risk factors: none. Use of agents associated with hypertension: none. History of target organ damage: none.  The following portions of the patient's history were reviewed and updated as appropriate: allergies, current medications, past family history, past medical history, past social history, past surgical history and problem list.  Review of Systems Pertinent items are noted in HPI.     Objective:    BP 130/76  Pulse 77  Temp(Src) 98.7 F (37.1 C) (Oral)  Wt 170 lb 12.8 oz (77.474 kg)  SpO2 96% General appearance: alert, cooperative, appears stated age and no distress Lungs: clear to auscultation bilaterally Heart: regular rate and rhythm, S1, S2 normal, no murmur, click, rub or gallop Extremities: extremities normal, atraumatic, no cyanosis or edema    Assessment:    Hypertension, normal blood pressure . Evidence of target organ damage: none.   hyperlipidemia Plan:    Medication: no change. Dietary sodium restriction. Regular aerobic exercise. Check blood pressures 2-3 times weekly and record. Follow up: 6 months and as needed.  Check labs

## 2011-05-22 NOTE — Patient Instructions (Signed)

## 2011-06-19 ENCOUNTER — Other Ambulatory Visit: Payer: Self-pay | Admitting: Family Medicine

## 2011-06-20 ENCOUNTER — Other Ambulatory Visit: Payer: Self-pay | Admitting: Family Medicine

## 2011-06-24 ENCOUNTER — Other Ambulatory Visit: Payer: Self-pay | Admitting: Family Medicine

## 2011-08-26 ENCOUNTER — Ambulatory Visit (INDEPENDENT_AMBULATORY_CARE_PROVIDER_SITE_OTHER): Payer: Federal, State, Local not specified - PPO

## 2011-08-26 DIAGNOSIS — Z23 Encounter for immunization: Secondary | ICD-10-CM

## 2012-04-06 ENCOUNTER — Other Ambulatory Visit: Payer: Self-pay | Admitting: Family Medicine

## 2012-04-06 DIAGNOSIS — Z1231 Encounter for screening mammogram for malignant neoplasm of breast: Secondary | ICD-10-CM

## 2012-04-12 ENCOUNTER — Ambulatory Visit: Payer: Federal, State, Local not specified - PPO

## 2012-05-26 ENCOUNTER — Ambulatory Visit
Admission: RE | Admit: 2012-05-26 | Discharge: 2012-05-26 | Disposition: A | Discharge status: Home / Self Care | Payer: Federal, State, Local not specified - PPO | Source: Ambulatory Visit | Attending: Family Medicine | Admitting: Family Medicine

## 2012-05-26 DIAGNOSIS — Z1231 Encounter for screening mammogram for malignant neoplasm of breast: Secondary | ICD-10-CM

## 2012-06-16 ENCOUNTER — Other Ambulatory Visit: Payer: Self-pay | Admitting: Family Medicine

## 2012-06-16 NOTE — Telephone Encounter (Signed)
Apt pending.      KP 

## 2012-06-18 ENCOUNTER — Other Ambulatory Visit: Payer: Self-pay | Admitting: Family Medicine

## 2012-07-27 ENCOUNTER — Encounter: Payer: Self-pay | Admitting: Family Medicine

## 2012-07-27 ENCOUNTER — Ambulatory Visit (INDEPENDENT_AMBULATORY_CARE_PROVIDER_SITE_OTHER): Payer: Federal, State, Local not specified - PPO | Admitting: Family Medicine

## 2012-07-27 VITALS — BP 134/70 | HR 89 | Temp 98.3°F | Ht 66.0 in | Wt 179.2 lb

## 2012-07-27 DIAGNOSIS — F329 Major depressive disorder, single episode, unspecified: Secondary | ICD-10-CM

## 2012-07-27 DIAGNOSIS — Z23 Encounter for immunization: Secondary | ICD-10-CM

## 2012-07-27 DIAGNOSIS — F32A Depression, unspecified: Secondary | ICD-10-CM

## 2012-07-27 DIAGNOSIS — G43909 Migraine, unspecified, not intractable, without status migrainosus: Secondary | ICD-10-CM

## 2012-07-27 DIAGNOSIS — N390 Urinary tract infection, site not specified: Secondary | ICD-10-CM

## 2012-07-27 DIAGNOSIS — Z0000 Encounter for general adult medical examination without abnormal findings: Secondary | ICD-10-CM

## 2012-07-27 DIAGNOSIS — E785 Hyperlipidemia, unspecified: Secondary | ICD-10-CM

## 2012-07-27 DIAGNOSIS — J302 Other seasonal allergic rhinitis: Secondary | ICD-10-CM

## 2012-07-27 DIAGNOSIS — J309 Allergic rhinitis, unspecified: Secondary | ICD-10-CM

## 2012-07-27 DIAGNOSIS — I10 Essential (primary) hypertension: Secondary | ICD-10-CM

## 2012-07-27 LAB — CBC WITH DIFFERENTIAL/PLATELET
Eosinophils Relative: 2.7 % (ref 0.0–5.0)
Lymphocytes Relative: 26.4 % (ref 12.0–46.0)
Lymphs Abs: 1.1 10*3/uL (ref 0.7–4.0)
MCHC: 32.9 g/dL (ref 30.0–36.0)
MCV: 87.1 fl (ref 78.0–100.0)
Monocytes Relative: 9.6 % (ref 3.0–12.0)
Neutro Abs: 2.5 10*3/uL (ref 1.4–7.7)
RBC: 4.38 Mil/uL (ref 3.87–5.11)
RDW: 13.6 % (ref 11.5–14.6)

## 2012-07-27 LAB — POCT URINALYSIS DIPSTICK
Glucose, UA: NEGATIVE
Nitrite, UA: NEGATIVE
Protein, UA: NEGATIVE
Spec Grav, UA: 1.015
Urobilinogen, UA: 0.2

## 2012-07-27 MED ORDER — VALSARTAN-HYDROCHLOROTHIAZIDE 80-12.5 MG PO TABS: 1.0000 | ORAL_TABLET | Freq: Every day | ORAL | Status: DC

## 2012-07-27 MED ORDER — BUPROPION HCL ER (XL) 300 MG PO TB24: 300.0000 mg | ORAL_TABLET | ORAL | Status: DC

## 2012-07-27 MED ORDER — SIMVASTATIN 40 MG PO TABS: 40.0000 mg | ORAL_TABLET | Freq: Every day | ORAL | Status: DC

## 2012-07-27 MED ORDER — SUMATRIPTAN SUCCINATE 100 MG PO TABS: 100.0000 mg | ORAL_TABLET | ORAL | Status: DC | PRN

## 2012-07-27 MED ORDER — LEVOCETIRIZINE DIHYDROCHLORIDE 5 MG PO TABS: ORAL_TABLET | ORAL | Status: DC

## 2012-07-27 NOTE — Progress Notes (Signed)
Subjective:     Robin Wall is a 66 y.o. female and is here for a comprehensive physical exam. The patient reports no problems.  History   Social History  . Marital Status: Married    Spouse Name: N/A    Number of Children: N/A  . Years of Education: N/A   Occupational History  . [employer]    Social History Main Topics  . Smoking status: Never Smoker   . Smokeless tobacco: Never Used  . Alcohol Use: No  . Drug Use: No  . Sexually Active: Yes -- Female partner(s)   Other Topics Concern  . Not on file   Social History Narrative   Exercising---no   Health Maintenance  Topic Date Due  . Pneumococcal Polysaccharide Vaccine Age 35 And Over  07/05/2011  . Influenza Vaccine  07/04/2012  . Colonoscopy  11/05/2013  . Mammogram  05/26/2014  . Tetanus/tdap  04/22/2021  . Zostavax  Completed    The following portions of the patient's history were reviewed and updated as appropriate: allergies, current medications, past family history, past medical history, past social history, past surgical history and problem list.  Review of Systems  Review of Systems  Constitutional: Negative for activity change, appetite change and fatigue.  HENT: Negative for hearing loss, congestion, tinnitus and ear discharge.   Eyes: Negative for visual disturbance (see optho q1y -- vision corrected to 20/20 with glasses).  Respiratory: Negative for cough, chest tightness and shortness of breath.   Cardiovascular: Negative for chest pain, palpitations and leg swelling.  Gastrointestinal: Negative for abdominal pain, diarrhea, constipation and abdominal distention.  Genitourinary: Negative for urgency, frequency, decreased urine volume and difficulty urinating.  Musculoskeletal: Negative for back pain, arthralgias and gait problem.  Skin: Negative for color change, pallor and rash.  Neurological: Negative for dizziness, light-headedness, numbness and headaches.  Hematological: Negative for adenopathy. Does not  bruise/bleed easily.  Psychiatric/Behavioral: Negative for suicidal ideas, confusion, sleep disturbance, self-injury, dysphoric mood, decreased concentration and agitation.  Pt is able to read and write and can do all ADLs No risk for falling No abuse/ violence in home     Objective:    BP 134/70  Pulse 89  Temp 98.3 F (36.8 C) (Oral)  Ht 5\' 6"  (1.676 m)  Wt 179 lb 3.2 oz (81.285 kg)  BMI 28.92 kg/m2  SpO2 98% General appearance: alert, cooperative, appears stated age and no distress Head: Normocephalic, without obvious abnormality, atraumatic Eyes: conjunctivae/corneas clear. PERRL, EOM's intact. Fundi benign. Ears: normal TM's and external ear canals both ears Nose: Nares normal. Septum midline. Mucosa normal. No drainage or sinus tenderness. Throat: lips, mucosa, and tongue normal; teeth and gums normal Neck: no adenopathy, no carotid bruit, no JVD, supple, symmetrical, trachea midline and thyroid not enlarged, symmetric, no tenderness/mass/nodules Back: symmetric, no curvature. ROM normal. No CVA tenderness. Lungs: clear to auscultation bilaterally Breasts: normal appearance, no masses or tenderness Heart: regular rate and rhythm, S1, S2 normal, no murmur, click, rub or gallop Abdomen: soft, non-tender; bowel sounds normal; no masses,  no organomegaly Pelvic: deferred  Postmenopausal Extremities: extremities normal, atraumatic, no cyanosis or edema Pulses: 2+ and symmetric Skin: Skin color, texture, turgor normal. No rashes or lesions Lymph nodes: Cervical, supraclavicular, and axillary nodes normal. Neurologic: Alert and oriented X 3, normal strength and tone. Normal symmetric reflexes. Normal coordination and gait psych-- no anxiety,  no depression    Assessment:    Healthy female exam.      Plan:  ghm utd Check labs See After Visit Summary for Counseling Recommendations

## 2012-07-27 NOTE — Patient Instructions (Addendum)
Preventive Care for Adults, Female A healthy lifestyle and preventive care can promote health and wellness. Preventive health guidelines for women include the following key practices.  A routine yearly physical is a good way to check with your caregiver about your health and preventive screening. It is a chance to share any concerns and updates on your health, and to receive a thorough exam.   Visit your dentist for a routine exam and preventive care every 6 months. Brush your teeth twice a day and floss once a day. Good oral hygiene prevents tooth decay and gum disease.   The frequency of eye exams is based on your age, health, family medical history, use of contact lenses, and other factors. Follow your caregiver's recommendations for frequency of eye exams.   Eat a healthy diet. Foods like vegetables, fruits, whole grains, low-fat dairy products, and lean protein foods contain the nutrients you need without too many calories. Decrease your intake of foods high in solid fats, added sugars, and salt. Eat the right amount of calories for you.Get information about a proper diet from your caregiver, if necessary.   Regular physical exercise is one of the most important things you can do for your health. Most adults should get at least 150 minutes of moderate-intensity exercise (any activity that increases your heart rate and causes you to sweat) each week. In addition, most adults need muscle-strengthening exercises on 2 or more days a week.   Maintain a healthy weight. The body mass index (BMI) is a screening tool to identify possible weight problems. It provides an estimate of body fat based on height and weight. Your caregiver can help determine your BMI, and can help you achieve or maintain a healthy weight.For adults 20 years and older:   A BMI below 18.5 is considered underweight.   A BMI of 18.5 to 24.9 is normal.   A BMI of 25 to 29.9 is considered overweight.   A BMI of 30 and above is  considered obese.   Maintain normal blood lipids and cholesterol levels by exercising and minimizing your intake of saturated fat. Eat a balanced diet with plenty of fruit and vegetables. Blood tests for lipids and cholesterol should begin at age 20 and be repeated every 5 years. If your lipid or cholesterol levels are high, you are over 50, or you are at high risk for heart disease, you may need your cholesterol levels checked more frequently.Ongoing high lipid and cholesterol levels should be treated with medicines if diet and exercise are not effective.   If you smoke, find out from your caregiver how to quit. If you do not use tobacco, do not start.   If you are pregnant, do not drink alcohol. If you are breastfeeding, be very cautious about drinking alcohol. If you are not pregnant and choose to drink alcohol, do not exceed 1 drink per day. One drink is considered to be 12 ounces (355 mL) of beer, 5 ounces (148 mL) of wine, or 1.5 ounces (44 mL) of liquor.   Avoid use of street drugs. Do not share needles with anyone. Ask for help if you need support or instructions about stopping the use of drugs.   High blood pressure causes heart disease and increases the risk of stroke. Your blood pressure should be checked at least every 1 to 2 years. Ongoing high blood pressure should be treated with medicines if weight loss and exercise are not effective.   If you are 55 to 66   years old, ask your caregiver if you should take aspirin to prevent strokes.   Diabetes screening involves taking a blood sample to check your fasting blood sugar level. This should be done once every 3 years, after age 45, if you are within normal weight and without risk factors for diabetes. Testing should be considered at a younger age or be carried out more frequently if you are overweight and have at least 1 risk factor for diabetes.   Breast cancer screening is essential preventive care for women. You should practice "breast  self-awareness." This means understanding the normal appearance and feel of your breasts and may include breast self-examination. Any changes detected, no matter how small, should be reported to a caregiver. Women in their 20s and 30s should have a clinical breast exam (CBE) by a caregiver as part of a regular health exam every 1 to 3 years. After age 40, women should have a CBE every year. Starting at age 40, women should consider having a mammography (breast X-ray test) every year. Women who have a family history of breast cancer should talk to their caregiver about genetic screening. Women at a high risk of breast cancer should talk to their caregivers about having magnetic resonance imaging (MRI) and a mammography every year.   The Pap test is a screening test for cervical cancer. A Pap test can show cell changes on the cervix that might become cervical cancer if left untreated. A Pap test is a procedure in which cells are obtained and examined from the lower end of the uterus (cervix).   Women should have a Pap test starting at age 21.   Between ages 21 and 29, Pap tests should be repeated every 2 years.   Beginning at age 30, you should have a Pap test every 3 years as long as the past 3 Pap tests have been normal.   Some women have medical problems that increase the chance of getting cervical cancer. Talk to your caregiver about these problems. It is especially important to talk to your caregiver if a new problem develops soon after your last Pap test. In these cases, your caregiver may recommend more frequent screening and Pap tests.   The above recommendations are the same for women who have or have not gotten the vaccine for human papillomavirus (HPV).   If you had a hysterectomy for a problem that was not cancer or a condition that could lead to cancer, then you no longer need Pap tests. Even if you no longer need a Pap test, a regular exam is a good idea to make sure no other problems are  starting.   If you are between ages 65 and 70, and you have had normal Pap tests going back 10 years, you no longer need Pap tests. Even if you no longer need a Pap test, a regular exam is a good idea to make sure no other problems are starting.   If you have had past treatment for cervical cancer or a condition that could lead to cancer, you need Pap tests and screening for cancer for at least 20 years after your treatment.   If Pap tests have been discontinued, risk factors (such as a new sexual partner) need to be reassessed to determine if screening should be resumed.   The HPV test is an additional test that may be used for cervical cancer screening. The HPV test looks for the virus that can cause the cell changes on the cervix.   The cells collected during the Pap test can be tested for HPV. The HPV test could be used to screen women aged 30 years and older, and should be used in women of any age who have unclear Pap test results. After the age of 30, women should have HPV testing at the same frequency as a Pap test.   Colorectal cancer can be detected and often prevented. Most routine colorectal cancer screening begins at the age of 50 and continues through age 75. However, your caregiver may recommend screening at an earlier age if you have risk factors for colon cancer. On a yearly basis, your caregiver may provide home test kits to check for hidden blood in the stool. Use of a small camera at the end of a tube, to directly examine the colon (sigmoidoscopy or colonoscopy), can detect the earliest forms of colorectal cancer. Talk to your caregiver about this at age 50, when routine screening begins. Direct examination of the colon should be repeated every 5 to 10 years through age 75, unless early forms of pre-cancerous polyps or small growths are found.   Hepatitis C blood testing is recommended for all people born from 1945 through 1965 and any individual with known risks for hepatitis C.    Practice safe sex. Use condoms and avoid high-risk sexual practices to reduce the spread of sexually transmitted infections (STIs). STIs include gonorrhea, chlamydia, syphilis, trichomonas, herpes, HPV, and human immunodeficiency virus (HIV). Herpes, HIV, and HPV are viral illnesses that have no cure. They can result in disability, cancer, and death. Sexually active women aged 25 and younger should be checked for chlamydia. Older women with new or multiple partners should also be tested for chlamydia. Testing for other STIs is recommended if you are sexually active and at increased risk.   Osteoporosis is a disease in which the bones lose minerals and strength with aging. This can result in serious bone fractures. The risk of osteoporosis can be identified using a bone density scan. Women ages 65 and over and women at risk for fractures or osteoporosis should discuss screening with their caregivers. Ask your caregiver whether you should take a calcium supplement or vitamin D to reduce the rate of osteoporosis.   Menopause can be associated with physical symptoms and risks. Hormone replacement therapy is available to decrease symptoms and risks. You should talk to your caregiver about whether hormone replacement therapy is right for you.   Use sunscreen with sun protection factor (SPF) of 30 or more. Apply sunscreen liberally and repeatedly throughout the day. You should seek shade when your shadow is shorter than you. Protect yourself by wearing long sleeves, pants, a wide-brimmed hat, and sunglasses year round, whenever you are outdoors.   Once a month, do a whole body skin exam, using a mirror to look at the skin on your back. Notify your caregiver of new moles, moles that have irregular borders, moles that are larger than a pencil eraser, or moles that have changed in shape or color.   Stay current with required immunizations.   Influenza. You need a dose every fall (or winter). The composition of  the flu vaccine changes each year, so being vaccinated once is not enough.   Pneumococcal polysaccharide. You need 1 to 2 doses if you smoke cigarettes or if you have certain chronic medical conditions. You need 1 dose at age 65 (or older) if you have never been vaccinated.   Tetanus, diphtheria, pertussis (Tdap, Td). Get 1 dose of   Tdap vaccine if you are younger than age 65, are over 65 and have contact with an infant, are a healthcare worker, are pregnant, or simply want to be protected from whooping cough. After that, you need a Td booster dose every 10 years. Consult your caregiver if you have not had at least 3 tetanus and diphtheria-containing shots sometime in your life or have a deep or dirty wound.   HPV. You need this vaccine if you are a woman age 26 or younger. The vaccine is given in 3 doses over 6 months.   Measles, mumps, rubella (MMR). You need at least 1 dose of MMR if you were born in 1957 or later. You may also need a second dose.   Meningococcal. If you are age 19 to 21 and a first-year college student living in a residence hall, or have one of several medical conditions, you need to get vaccinated against meningococcal disease. You may also need additional booster doses.   Zoster (shingles). If you are age 60 or older, you should get this vaccine.   Varicella (chickenpox). If you have never had chickenpox or you were vaccinated but received only 1 dose, talk to your caregiver to find out if you need this vaccine.   Hepatitis A. You need this vaccine if you have a specific risk factor for hepatitis A virus infection or you simply wish to be protected from this disease. The vaccine is usually given as 2 doses, 6 to 18 months apart.   Hepatitis B. You need this vaccine if you have a specific risk factor for hepatitis B virus infection or you simply wish to be protected from this disease. The vaccine is given in 3 doses, usually over 6 months.  Preventive Services /  Frequency Ages 19 to 39  Blood pressure check.** / Every 1 to 2 years.   Lipid and cholesterol check.** / Every 5 years beginning at age 20.   Clinical breast exam.** / Every 3 years for women in their 20s and 30s.   Pap test.** / Every 2 years from ages 21 through 29. Every 3 years starting at age 30 through age 65 or 70 with a history of 3 consecutive normal Pap tests.   HPV screening.** / Every 3 years from ages 30 through ages 65 to 70 with a history of 3 consecutive normal Pap tests.   Hepatitis C blood test.** / For any individual with known risks for hepatitis C.   Skin self-exam. / Monthly.   Influenza immunization.** / Every year.   Pneumococcal polysaccharide immunization.** / 1 to 2 doses if you smoke cigarettes or if you have certain chronic medical conditions.   Tetanus, diphtheria, pertussis (Tdap, Td) immunization. / A one-time dose of Tdap vaccine. After that, you need a Td booster dose every 10 years.   HPV immunization. / 3 doses over 6 months, if you are 26 and younger.   Measles, mumps, rubella (MMR) immunization. / You need at least 1 dose of MMR if you were born in 1957 or later. You may also need a second dose.   Meningococcal immunization. / 1 dose if you are age 19 to 21 and a first-year college student living in a residence hall, or have one of several medical conditions, you need to get vaccinated against meningococcal disease. You may also need additional booster doses.   Varicella immunization.** / Consult your caregiver.   Hepatitis A immunization.** / Consult your caregiver. 2 doses, 6 to 18 months   apart.   Hepatitis B immunization.** / Consult your caregiver. 3 doses usually over 6 months.  Ages 40 to 64  Blood pressure check.** / Every 1 to 2 years.   Lipid and cholesterol check.** / Every 5 years beginning at age 20.   Clinical breast exam.** / Every year after age 40.   Mammogram.** / Every year beginning at age 40 and continuing for as  long as you are in good health. Consult with your caregiver.   Pap test.** / Every 3 years starting at age 30 through age 65 or 70 with a history of 3 consecutive normal Pap tests.   HPV screening.** / Every 3 years from ages 30 through ages 65 to 70 with a history of 3 consecutive normal Pap tests.   Fecal occult blood test (FOBT) of stool. / Every year beginning at age 50 and continuing until age 75. You may not need to do this test if you get a colonoscopy every 10 years.   Flexible sigmoidoscopy or colonoscopy.** / Every 5 years for a flexible sigmoidoscopy or every 10 years for a colonoscopy beginning at age 50 and continuing until age 75.   Hepatitis C blood test.** / For all people born from 1945 through 1965 and any individual with known risks for hepatitis C.   Skin self-exam. / Monthly.   Influenza immunization.** / Every year.   Pneumococcal polysaccharide immunization.** / 1 to 2 doses if you smoke cigarettes or if you have certain chronic medical conditions.   Tetanus, diphtheria, pertussis (Tdap, Td) immunization.** / A one-time dose of Tdap vaccine. After that, you need a Td booster dose every 10 years.   Measles, mumps, rubella (MMR) immunization. / You need at least 1 dose of MMR if you were born in 1957 or later. You may also need a second dose.   Varicella immunization.** / Consult your caregiver.   Meningococcal immunization.** / Consult your caregiver.   Hepatitis A immunization.** / Consult your caregiver. 2 doses, 6 to 18 months apart.   Hepatitis B immunization.** / Consult your caregiver. 3 doses, usually over 6 months.  Ages 65 and over  Blood pressure check.** / Every 1 to 2 years.   Lipid and cholesterol check.** / Every 5 years beginning at age 20.   Clinical breast exam.** / Every year after age 40.   Mammogram.** / Every year beginning at age 40 and continuing for as long as you are in good health. Consult with your caregiver.   Pap test.** /  Every 3 years starting at age 30 through age 65 or 70 with a 3 consecutive normal Pap tests. Testing can be stopped between 65 and 70 with 3 consecutive normal Pap tests and no abnormal Pap or HPV tests in the past 10 years.   HPV screening.** / Every 3 years from ages 30 through ages 65 or 70 with a history of 3 consecutive normal Pap tests. Testing can be stopped between 65 and 70 with 3 consecutive normal Pap tests and no abnormal Pap or HPV tests in the past 10 years.   Fecal occult blood test (FOBT) of stool. / Every year beginning at age 50 and continuing until age 75. You may not need to do this test if you get a colonoscopy every 10 years.   Flexible sigmoidoscopy or colonoscopy.** / Every 5 years for a flexible sigmoidoscopy or every 10 years for a colonoscopy beginning at age 50 and continuing until age 75.   Hepatitis   C blood test.** / For all people born from 1945 through 1965 and any individual with known risks for hepatitis C.   Osteoporosis screening.** / A one-time screening for women ages 65 and over and women at risk for fractures or osteoporosis.   Skin self-exam. / Monthly.   Influenza immunization.** / Every year.   Pneumococcal polysaccharide immunization.** / 1 dose at age 65 (or older) if you have never been vaccinated.   Tetanus, diphtheria, pertussis (Tdap, Td) immunization. / A one-time dose of Tdap vaccine if you are over 65 and have contact with an infant, are a healthcare worker, or simply want to be protected from whooping cough. After that, you need a Td booster dose every 10 years.   Varicella immunization.** / Consult your caregiver.   Meningococcal immunization.** / Consult your caregiver.   Hepatitis A immunization.** / Consult your caregiver. 2 doses, 6 to 18 months apart.   Hepatitis B immunization.** / Check with your caregiver. 3 doses, usually over 6 months.  ** Family history and personal history of risk and conditions may change your caregiver's  recommendations. Document Released: 12/16/2001 Document Revised: 10/09/2011 Document Reviewed: 03/17/2011 [employer] Patient Information 2012 [employer]. 

## 2012-07-27 NOTE — Assessment & Plan Note (Signed)
Stable con't meds 

## 2012-07-27 NOTE — Assessment & Plan Note (Signed)
Check labs con't meds 

## 2012-07-28 LAB — HEPATIC FUNCTION PANEL
ALT: 30 U/L (ref 0–35)
AST: 30 U/L (ref 0–37)
Alkaline Phosphatase: 65 U/L (ref 39–117)
Total Bilirubin: 0.7 mg/dL (ref 0.3–1.2)

## 2012-07-28 LAB — BASIC METABOLIC PANEL
BUN: 13 mg/dL (ref 6–23)
CO2: 28 mEq/L (ref 19–32)
Calcium: 9.1 mg/dL (ref 8.4–10.5)
Chloride: 101 mEq/L (ref 96–112)
Creatinine, Ser: 0.7 mg/dL (ref 0.4–1.2)
Glucose, Bld: 87 mg/dL (ref 70–99)

## 2012-07-28 LAB — LIPID PANEL
Cholesterol: 175 mg/dL (ref 0–200)
LDL Cholesterol: 82 mg/dL (ref 0–99)

## 2012-07-29 LAB — URINE CULTURE: Colony Count: 30000

## 2012-08-01 ENCOUNTER — Other Ambulatory Visit: Payer: Self-pay | Admitting: Family Medicine

## 2013-05-27 ENCOUNTER — Other Ambulatory Visit: Payer: Self-pay

## 2013-05-27 DIAGNOSIS — Z1231 Encounter for screening mammogram for malignant neoplasm of breast: Secondary | ICD-10-CM

## 2013-06-09 ENCOUNTER — Other Ambulatory Visit: Payer: Self-pay | Admitting: Family Medicine

## 2013-06-14 ENCOUNTER — Ambulatory Visit: Payer: Federal, State, Local not specified - PPO

## 2013-06-24 ENCOUNTER — Ambulatory Visit: Payer: Federal, State, Local not specified - PPO

## 2013-07-06 ENCOUNTER — Ambulatory Visit
Admission: RE | Admit: 2013-07-06 | Discharge: 2013-07-06 | Disposition: A | Discharge status: Home / Self Care | Payer: Federal, State, Local not specified - PPO | Source: Ambulatory Visit

## 2013-07-06 DIAGNOSIS — Z1231 Encounter for screening mammogram for malignant neoplasm of breast: Secondary | ICD-10-CM

## 2013-07-27 ENCOUNTER — Telehealth: Payer: Self-pay

## 2013-07-27 NOTE — Telephone Encounter (Signed)
HM-discuss second pneumonia; needs flu vaccine Mammogram 07/06/2013 UTD; all other health maintenance UTD Meds reconciled, pharmacy and allergies verified.

## 2013-07-27 NOTE — Telephone Encounter (Signed)
LVM for CB  HM up to date with exception of pneumonia and flu vaccines

## 2013-07-28 ENCOUNTER — Encounter: Payer: Self-pay | Admitting: Family Medicine

## 2013-07-28 ENCOUNTER — Ambulatory Visit (INDEPENDENT_AMBULATORY_CARE_PROVIDER_SITE_OTHER): Payer: Federal, State, Local not specified - PPO | Admitting: Family Medicine

## 2013-07-28 VITALS — BP 130/70 | HR 85 | Temp 98.7°F | Ht 66.0 in | Wt 188.2 lb

## 2013-07-28 DIAGNOSIS — Z23 Encounter for immunization: Secondary | ICD-10-CM

## 2013-07-28 DIAGNOSIS — I10 Essential (primary) hypertension: Secondary | ICD-10-CM

## 2013-07-28 DIAGNOSIS — J309 Allergic rhinitis, unspecified: Secondary | ICD-10-CM

## 2013-07-28 DIAGNOSIS — F329 Major depressive disorder, single episode, unspecified: Secondary | ICD-10-CM

## 2013-07-28 DIAGNOSIS — E669 Obesity, unspecified: Secondary | ICD-10-CM | POA: Insufficient documentation

## 2013-07-28 DIAGNOSIS — N390 Urinary tract infection, site not specified: Secondary | ICD-10-CM

## 2013-07-28 DIAGNOSIS — J302 Other seasonal allergic rhinitis: Secondary | ICD-10-CM

## 2013-07-28 DIAGNOSIS — Z0000 Encounter for general adult medical examination without abnormal findings: Secondary | ICD-10-CM

## 2013-07-28 DIAGNOSIS — E785 Hyperlipidemia, unspecified: Secondary | ICD-10-CM

## 2013-07-28 LAB — LIPID PANEL
Cholesterol: 192 mg/dL (ref 0–200)
LDL Cholesterol: 98 mg/dL (ref 0–99)
Triglycerides: 139 mg/dL (ref 0.0–149.0)
VLDL: 27.8 mg/dL (ref 0.0–40.0)

## 2013-07-28 LAB — BASIC METABOLIC PANEL
BUN: 14 mg/dL (ref 6–23)
Chloride: 102 mEq/L (ref 96–112)
Glucose, Bld: 93 mg/dL (ref 70–99)
Potassium: 4.1 mEq/L (ref 3.5–5.1)

## 2013-07-28 LAB — CBC WITH DIFFERENTIAL/PLATELET
Basophils Absolute: 0 10*3/uL (ref 0.0–0.1)
Eosinophils Absolute: 0.1 10*3/uL (ref 0.0–0.7)
HCT: 36.5 % (ref 36.0–46.0)
Lymphs Abs: 1.1 10*3/uL (ref 0.7–4.0)
MCV: 84.8 fl (ref 78.0–100.0)
Monocytes Absolute: 0.4 10*3/uL (ref 0.1–1.0)
Platelets: 253 10*3/uL (ref 150.0–400.0)
RDW: 13.9 % (ref 11.5–14.6)

## 2013-07-28 LAB — POCT URINALYSIS DIPSTICK
Blood, UA: NEGATIVE
Glucose, UA: NEGATIVE
Spec Grav, UA: 1.02
Urobilinogen, UA: 0.2

## 2013-07-28 LAB — HEPATIC FUNCTION PANEL: Total Bilirubin: 0.7 mg/dL (ref 0.3–1.2)

## 2013-07-28 MED ORDER — SIMVASTATIN 40 MG PO TABS: 40.0000 mg | ORAL_TABLET | Freq: Every day | ORAL | Status: DC

## 2013-07-28 MED ORDER — FLUTICASONE PROPIONATE 50 MCG/ACT NA SUSP: NASAL | Status: DC

## 2013-07-28 MED ORDER — VALSARTAN-HYDROCHLOROTHIAZIDE 80-12.5 MG PO TABS: 1.0000 | ORAL_TABLET | Freq: Every day | ORAL | Status: DC

## 2013-07-28 MED ORDER — BUPROPION HCL ER (XL) 300 MG PO TB24: 300.0000 mg | ORAL_TABLET | ORAL | Status: DC

## 2013-07-28 MED ORDER — LEVOCETIRIZINE DIHYDROCHLORIDE 5 MG PO TABS: ORAL_TABLET | ORAL | Status: DC

## 2013-07-28 NOTE — Patient Instructions (Addendum)
Preventive Care for Adults, Female A healthy lifestyle and preventive care can promote health and wellness. Preventive health guidelines for women include the following key practices.  A routine yearly physical is a good way to check with your caregiver about your health and preventive screening. It is a chance to share any concerns and updates on your health, and to receive a thorough exam.  Visit your dentist for a routine exam and preventive care every 6 months. Brush your teeth twice a day and floss once a day. Good oral hygiene prevents tooth decay and gum disease.  The frequency of eye exams is based on your age, health, family medical history, use of contact lenses, and other factors. Follow your caregiver's recommendations for frequency of eye exams.  Eat a healthy diet. Foods like vegetables, fruits, whole grains, low-fat dairy products, and lean protein foods contain the nutrients you need without too many calories. Decrease your intake of foods high in solid fats, added sugars, and salt. Eat the right amount of calories for you.Get information about a proper diet from your caregiver, if necessary.  Regular physical exercise is one of the most important things you can do for your health. Most adults should get at least 150 minutes of moderate-intensity exercise (any activity that increases your heart rate and causes you to sweat) each week. In addition, most adults need muscle-strengthening exercises on 2 or more days a week.  Maintain a healthy weight. The body mass index (BMI) is a screening tool to identify possible weight problems. It provides an estimate of body fat based on height and weight. Your caregiver can help determine your BMI, and can help you achieve or maintain a healthy weight.For adults 20 years and older:  A BMI below 18.5 is considered underweight.  A BMI of 18.5 to 24.9 is normal.  A BMI of 25 to 29.9 is considered overweight.  A BMI of 30 and above is  considered obese.  Maintain normal blood lipids and cholesterol levels by exercising and minimizing your intake of saturated fat. Eat a balanced diet with plenty of fruit and vegetables. Blood tests for lipids and cholesterol should begin at age 20 and be repeated every 5 years. If your lipid or cholesterol levels are high, you are over 50, or you are at high risk for heart disease, you may need your cholesterol levels checked more frequently.Ongoing high lipid and cholesterol levels should be treated with medicines if diet and exercise are not effective.  If you smoke, find out from your caregiver how to quit. If you do not use tobacco, do not start.  If you are pregnant, do not drink alcohol. If you are breastfeeding, be very cautious about drinking alcohol. If you are not pregnant and choose to drink alcohol, do not exceed 1 drink per day. One drink is considered to be 12 ounces (355 mL) of beer, 5 ounces (148 mL) of wine, or 1.5 ounces (44 mL) of liquor.  Avoid use of street drugs. Do not share needles with anyone. Ask for help if you need support or instructions about stopping the use of drugs.  High blood pressure causes heart disease and increases the risk of stroke. Your blood pressure should be checked at least every 1 to 2 years. Ongoing high blood pressure should be treated with medicines if weight loss and exercise are not effective.  If you are 55 to 67 years old, ask your caregiver if you should take aspirin to prevent strokes.  Diabetes   screening involves taking a blood sample to check your fasting blood sugar level. This should be done once every 3 years, after age 45, if you are within normal weight and without risk factors for diabetes. Testing should be considered at a younger age or be carried out more frequently if you are overweight and have at least 1 risk factor for diabetes.  Breast cancer screening is essential preventive care for women. You should practice "breast  self-awareness." This means understanding the normal appearance and feel of your breasts and may include breast self-examination. Any changes detected, no matter how small, should be reported to a caregiver. Women in their 20s and 30s should have a clinical breast exam (CBE) by a caregiver as part of a regular health exam every 1 to 3 years. After age 40, women should have a CBE every year. Starting at age 40, women should consider having a mammography (breast X-ray test) every year. Women who have a family history of breast cancer should talk to their caregiver about genetic screening. Women at a high risk of breast cancer should talk to their caregivers about having magnetic resonance imaging (MRI) and a mammography every year.  The Pap test is a screening test for cervical cancer. A Pap test can show cell changes on the cervix that might become cervical cancer if left untreated. A Pap test is a procedure in which cells are obtained and examined from the lower end of the uterus (cervix).  Women should have a Pap test starting at age 21.  Between ages 21 and 29, Pap tests should be repeated every 2 years.  Beginning at age 30, you should have a Pap test every 3 years as long as the past 3 Pap tests have been normal.  Some women have medical problems that increase the chance of getting cervical cancer. Talk to your caregiver about these problems. It is especially important to talk to your caregiver if a new problem develops soon after your last Pap test. In these cases, your caregiver may recommend more frequent screening and Pap tests.  The above recommendations are the same for women who have or have not gotten the vaccine for human papillomavirus (HPV).  If you had a hysterectomy for a problem that was not cancer or a condition that could lead to cancer, then you no longer need Pap tests. Even if you no longer need a Pap test, a regular exam is a good idea to make sure no other problems are  starting.  If you are between ages 65 and 70, and you have had normal Pap tests going back 10 years, you no longer need Pap tests. Even if you no longer need a Pap test, a regular exam is a good idea to make sure no other problems are starting.  If you have had past treatment for cervical cancer or a condition that could lead to cancer, you need Pap tests and screening for cancer for at least 20 years after your treatment.  If Pap tests have been discontinued, risk factors (such as a new sexual partner) need to be reassessed to determine if screening should be resumed.  The HPV test is an additional test that may be used for cervical cancer screening. The HPV test looks for the virus that can cause the cell changes on the cervix. The cells collected during the Pap test can be tested for HPV. The HPV test could be used to screen women aged 30 years and older, and should   be used in women of any age who have unclear Pap test results. After the age of 30, women should have HPV testing at the same frequency as a Pap test.  Colorectal cancer can be detected and often prevented. Most routine colorectal cancer screening begins at the age of 50 and continues through age 75. However, your caregiver may recommend screening at an earlier age if you have risk factors for colon cancer. On a yearly basis, your caregiver may provide home test kits to check for hidden blood in the stool. Use of a small camera at the end of a tube, to directly examine the colon (sigmoidoscopy or colonoscopy), can detect the earliest forms of colorectal cancer. Talk to your caregiver about this at age 50, when routine screening begins. Direct examination of the colon should be repeated every 5 to 10 years through age 75, unless early forms of pre-cancerous polyps or small growths are found.  Hepatitis C blood testing is recommended for all people born from 1945 through 1965 and any individual with known risks for hepatitis C.  Practice  safe sex. Use condoms and avoid high-risk sexual practices to reduce the spread of sexually transmitted infections (STIs). STIs include gonorrhea, chlamydia, syphilis, trichomonas, herpes, HPV, and human immunodeficiency virus (HIV). Herpes, HIV, and HPV are viral illnesses that have no cure. They can result in disability, cancer, and death. Sexually active women aged 25 and younger should be checked for chlamydia. Older women with new or multiple partners should also be tested for chlamydia. Testing for other STIs is recommended if you are sexually active and at increased risk.  Osteoporosis is a disease in which the bones lose minerals and strength with aging. This can result in serious bone fractures. The risk of osteoporosis can be identified using a bone density scan. Women ages 65 and over and women at risk for fractures or osteoporosis should discuss screening with their caregivers. Ask your caregiver whether you should take a calcium supplement or vitamin D to reduce the rate of osteoporosis.  Menopause can be associated with physical symptoms and risks. Hormone replacement therapy is available to decrease symptoms and risks. You should talk to your caregiver about whether hormone replacement therapy is right for you.  Use sunscreen with sun protection factor (SPF) of 30 or more. Apply sunscreen liberally and repeatedly throughout the day. You should seek shade when your shadow is shorter than you. Protect yourself by wearing long sleeves, pants, a wide-brimmed hat, and sunglasses year round, whenever you are outdoors.  Once a month, do a whole body skin exam, using a mirror to look at the skin on your back. Notify your caregiver of new moles, moles that have irregular borders, moles that are larger than a pencil eraser, or moles that have changed in shape or color.  Stay current with required immunizations.  Influenza. You need a dose every fall (or winter). The composition of the flu vaccine  changes each year, so being vaccinated once is not enough.  Pneumococcal polysaccharide. You need 1 to 2 doses if you smoke cigarettes or if you have certain chronic medical conditions. You need 1 dose at age 65 (or older) if you have never been vaccinated.  Tetanus, diphtheria, pertussis (Tdap, Td). Get 1 dose of Tdap vaccine if you are younger than age 65, are over 65 and have contact with an infant, are a healthcare worker, are pregnant, or simply want to be protected from whooping cough. After that, you need a Td   booster dose every 10 years. Consult your caregiver if you have not had at least 3 tetanus and diphtheria-containing shots sometime in your life or have a deep or dirty wound.  HPV. You need this vaccine if you are a woman age 26 or younger. The vaccine is given in 3 doses over 6 months.  Measles, mumps, rubella (MMR). You need at least 1 dose of MMR if you were born in 1957 or later. You may also need a second dose.  Meningococcal. If you are age 19 to 21 and a first-year college student living in a residence hall, or have one of several medical conditions, you need to get vaccinated against meningococcal disease. You may also need additional booster doses.  Zoster (shingles). If you are age 60 or older, you should get this vaccine.  Varicella (chickenpox). If you have never had chickenpox or you were vaccinated but received only 1 dose, talk to your caregiver to find out if you need this vaccine.  Hepatitis A. You need this vaccine if you have a specific risk factor for hepatitis A virus infection or you simply wish to be protected from this disease. The vaccine is usually given as 2 doses, 6 to 18 months apart.  Hepatitis B. You need this vaccine if you have a specific risk factor for hepatitis B virus infection or you simply wish to be protected from this disease. The vaccine is given in 3 doses, usually over 6 months. Preventive Services / Frequency Ages 19 to 39  Blood  pressure check.** / Every 1 to 2 years.  Lipid and cholesterol check.** / Every 5 years beginning at age 20.  Clinical breast exam.** / Every 3 years for women in their 20s and 30s.  Pap test.** / Every 2 years from ages 21 through 29. Every 3 years starting at age 30 through age 65 or 70 with a history of 3 consecutive normal Pap tests.  HPV screening.** / Every 3 years from ages 30 through ages 65 to 70 with a history of 3 consecutive normal Pap tests.  Hepatitis C blood test.** / For any individual with known risks for hepatitis C.  Skin self-exam. / Monthly.  Influenza immunization.** / Every year.  Pneumococcal polysaccharide immunization.** / 1 to 2 doses if you smoke cigarettes or if you have certain chronic medical conditions.  Tetanus, diphtheria, pertussis (Tdap, Td) immunization. / A one-time dose of Tdap vaccine. After that, you need a Td booster dose every 10 years.  HPV immunization. / 3 doses over 6 months, if you are 26 and younger.  Measles, mumps, rubella (MMR) immunization. / You need at least 1 dose of MMR if you were born in 1957 or later. You may also need a second dose.  Meningococcal immunization. / 1 dose if you are age 19 to 21 and a first-year college student living in a residence hall, or have one of several medical conditions, you need to get vaccinated against meningococcal disease. You may also need additional booster doses.  Varicella immunization.** / Consult your caregiver.  Hepatitis A immunization.** / Consult your caregiver. 2 doses, 6 to 18 months apart.  Hepatitis B immunization.** / Consult your caregiver. 3 doses usually over 6 months. Ages 40 to 64  Blood pressure check.** / Every 1 to 2 years.  Lipid and cholesterol check.** / Every 5 years beginning at age 20.  Clinical breast exam.** / Every year after age 40.  Mammogram.** / Every year beginning at age 40   and continuing for as long as you are in good health. Consult with your  caregiver.  Pap test.** / Every 3 years starting at age 30 through age 65 or 70 with a history of 3 consecutive normal Pap tests.  HPV screening.** / Every 3 years from ages 30 through ages 65 to 70 with a history of 3 consecutive normal Pap tests.  Fecal occult blood test (FOBT) of stool. / Every year beginning at age 50 and continuing until age 75. You may not need to do this test if you get a colonoscopy every 10 years.  Flexible sigmoidoscopy or colonoscopy.** / Every 5 years for a flexible sigmoidoscopy or every 10 years for a colonoscopy beginning at age 50 and continuing until age 75.  Hepatitis C blood test.** / For all people born from 1945 through 1965 and any individual with known risks for hepatitis C.  Skin self-exam. / Monthly.  Influenza immunization.** / Every year.  Pneumococcal polysaccharide immunization.** / 1 to 2 doses if you smoke cigarettes or if you have certain chronic medical conditions.  Tetanus, diphtheria, pertussis (Tdap, Td) immunization.** / A one-time dose of Tdap vaccine. After that, you need a Td booster dose every 10 years.  Measles, mumps, rubella (MMR) immunization. / You need at least 1 dose of MMR if you were born in 1957 or later. You may also need a second dose.  Varicella immunization.** / Consult your caregiver.  Meningococcal immunization.** / Consult your caregiver.  Hepatitis A immunization.** / Consult your caregiver. 2 doses, 6 to 18 months apart.  Hepatitis B immunization.** / Consult your caregiver. 3 doses, usually over 6 months. Ages 65 and over  Blood pressure check.** / Every 1 to 2 years.  Lipid and cholesterol check.** / Every 5 years beginning at age 20.  Clinical breast exam.** / Every year after age 40.  Mammogram.** / Every year beginning at age 40 and continuing for as long as you are in good health. Consult with your caregiver.  Pap test.** / Every 3 years starting at age 30 through age 65 or 70 with a 3  consecutive normal Pap tests. Testing can be stopped between 65 and 70 with 3 consecutive normal Pap tests and no abnormal Pap or HPV tests in the past 10 years.  HPV screening.** / Every 3 years from ages 30 through ages 65 or 70 with a history of 3 consecutive normal Pap tests. Testing can be stopped between 65 and 70 with 3 consecutive normal Pap tests and no abnormal Pap or HPV tests in the past 10 years.  Fecal occult blood test (FOBT) of stool. / Every year beginning at age 50 and continuing until age 75. You may not need to do this test if you get a colonoscopy every 10 years.  Flexible sigmoidoscopy or colonoscopy.** / Every 5 years for a flexible sigmoidoscopy or every 10 years for a colonoscopy beginning at age 50 and continuing until age 75.  Hepatitis C blood test.** / For all people born from 1945 through 1965 and any individual with known risks for hepatitis C.  Osteoporosis screening.** / A one-time screening for women ages 65 and over and women at risk for fractures or osteoporosis.  Skin self-exam. / Monthly.  Influenza immunization.** / Every year.  Pneumococcal polysaccharide immunization.** / 1 dose at age 65 (or older) if you have never been vaccinated.  Tetanus, diphtheria, pertussis (Tdap, Td) immunization. / A one-time dose of Tdap vaccine if you are over   65 and have contact with an infant, are a healthcare worker, or simply want to be protected from whooping cough. After that, you need a Td booster dose every 10 years.  Varicella immunization.** / Consult your caregiver.  Meningococcal immunization.** / Consult your caregiver.  Hepatitis A immunization.** / Consult your caregiver. 2 doses, 6 to 18 months apart.  Hepatitis B immunization.** / Check with your caregiver. 3 doses, usually over 6 months. ** Family history and personal history of risk and conditions may change your caregiver's recommendations. Document Released: 12/16/2001 Document Revised: 01/12/2012  Document Reviewed: 03/17/2011 [employer] Patient Information 2014 [employer].  

## 2013-07-28 NOTE — Assessment & Plan Note (Signed)
Stable Cont meds 

## 2013-07-28 NOTE — Progress Notes (Signed)
Subjective:     Robin Wall is a 67 y.o. female and is here for a comprehensive physical exam. The patient reports problems - uri symptoms.  History   Social History  . Marital Status: Married    Spouse Name: N/A    Number of Children: N/A  . Years of Education: N/A   Occupational History  . [employer]    Social History Main Topics  . Smoking status: Never Smoker   . Smokeless tobacco: Never Used  . Alcohol Use: No  . Drug Use: No  . Sexual Activity: Yes    Partners: Male   Other Topics Concern  . Not on file   Social History Narrative   Exercising---no         Health Maintenance  Topic Date Due  . Colonoscopy  11/05/2013  . Influenza Vaccine  06/03/2014  . Mammogram  07/07/2015  . Tetanus/tdap  04/22/2021  . Pneumococcal Polysaccharide Vaccine Age 45 And Over  Completed  . Zostavax  Completed    The following portions of the patient's history were reviewed and updated as appropriate:  She  has a past medical history of Hypertension; Hyperlipidemia; and Allergy. She  does not have any pertinent problems on file. She  has past surgical history that includes Cataract extraction. Her family history includes Breast cancer (age of onset: 75) in her paternal grandmother; Coronary artery disease in an other family member; Diabetes in her father; Heart disease in her father; Hyperlipidemia in her father and mother; Hypertension in her father and mother. She  reports that she has never smoked. She has never used smokeless tobacco. She reports that she does not drink alcohol or use illicit drugs. She has a current medication list which includes the following prescription(s): bupropion, fluticasone, levocetirizine, simvastatin, sumatriptan, and valsartan-hydrochlorothiazide. Current Outpatient Prescriptions on File Prior to Visit  Medication Sig Dispense Refill  . SUMAtriptan (IMITREX) 100 MG tablet Take 1 tablet (100 mg total) by mouth every 2 (two) hours as needed.  10 tablet   2   No current facility-administered medications on file prior to visit.   She has No Known Allergies..  Review of Systems Review of Systems  Constitutional: Negative for activity change, appetite change and fatigue.  HENT: Negative for hearing loss, congestion, tinnitus and ear discharge.  dentist q15m Eyes: Negative for visual disturbance (see optho q2y -- vision corrected to 20/20 with glasses).  Respiratory: Negative for cough, chest tightness and shortness of breath.   Cardiovascular: Negative for chest pain, palpitations and leg swelling.  Gastrointestinal: Negative for abdominal pain, diarrhea, constipation and abdominal distention.  Genitourinary: Negative for urgency, frequency, decreased urine volume and difficulty urinating.  Musculoskeletal: Negative for back pain, arthralgias and gait problem.  Skin: Negative for color change, pallor and rash.  Neurological: Negative for dizziness, light-headedness, numbness and headaches.  Hematological: Negative for adenopathy. Does not bruise/bleed easily.  Psychiatric/Behavioral: Negative for suicidal ideas, confusion, sleep disturbance, self-injury, dysphoric mood, decreased concentration and agitation.       Objective:    BP 130/70  Pulse 85  Temp(Src) 98.7 F (37.1 C) (Oral)  Ht 5\' 6"  (1.676 m)  Wt 188 lb 3.2 oz (85.367 kg)  BMI 30.39 kg/m2  SpO2 97% General appearance: alert, cooperative, appears stated age and no distress Head: Normocephalic, without obvious abnormality, atraumatic Eyes: conjunctivae/corneas clear. PERRL, EOM's intact. Fundi benign. Ears: normal TM's and external ear canals both ears Nose: Nares normal. Septum midline. Mucosa normal. No drainage or sinus tenderness.  Throat: lips, mucosa, and tongue normal; teeth and gums normal Neck: no adenopathy, no carotid bruit, no JVD, supple, symmetrical, trachea midline and thyroid not enlarged, symmetric, no tenderness/mass/nodules Back: symmetric, no curvature.  ROM normal. No CVA tenderness. Lungs: clear to auscultation bilaterally Breasts: gyn Heart: regular rate and rhythm, S1, S2 normal, no murmur, click, rub or gallop Abdomen: soft, non-tender; bowel sounds normal; no masses,  no organomegaly Pelvic: deferred - deferred Extremities: extremities normal, atraumatic, no cyanosis or edema Pulses: 2+ and symmetric Skin: Skin color, texture, turgor normal. No rashes or lesions Lymph nodes: Cervical, supraclavicular, and axillary nodes normal. Neurologic: Alert and oriented X 3, normal strength and tone. Normal symmetric reflexes. Normal coordination and gait Psych-- no depression, no anxiety       Assessment:    Healthy female exam.      Plan:     check labs ghm utd  See After Visit Summary for Counseling Recommendations

## 2013-07-28 NOTE — Assessment & Plan Note (Signed)
Check labs con't meds 

## 2013-07-30 LAB — URINE CULTURE

## 2013-08-28 ENCOUNTER — Other Ambulatory Visit: Payer: Self-pay | Admitting: Family Medicine

## 2013-09-19 ENCOUNTER — Encounter: Payer: Self-pay | Admitting: Family Medicine

## 2013-10-03 ENCOUNTER — Encounter: Payer: Self-pay | Admitting: Family Medicine

## 2013-12-06 ENCOUNTER — Other Ambulatory Visit: Payer: Self-pay | Admitting: Family Medicine

## 2013-12-06 NOTE — Telephone Encounter (Signed)
Fluticasone refilled per protocol. JG//CMA

## 2014-01-09 ENCOUNTER — Encounter: Payer: Self-pay | Admitting: Family Medicine

## 2014-01-09 ENCOUNTER — Ambulatory Visit (INDEPENDENT_AMBULATORY_CARE_PROVIDER_SITE_OTHER): Payer: Federal, State, Local not specified - PPO | Admitting: Family Medicine

## 2014-01-09 VITALS — BP 122/70 | HR 86 | Temp 98.5°F | Wt 195.2 lb

## 2014-01-09 DIAGNOSIS — K449 Diaphragmatic hernia without obstruction or gangrene: Secondary | ICD-10-CM

## 2014-01-09 DIAGNOSIS — R1011 Right upper quadrant pain: Secondary | ICD-10-CM

## 2014-01-09 LAB — BASIC METABOLIC PANEL
BUN: 14 mg/dL (ref 6–23)
CALCIUM: 9.7 mg/dL (ref 8.4–10.5)
CHLORIDE: 102 meq/L (ref 96–112)
CO2: 28 meq/L (ref 19–32)
CREATININE: 0.8 mg/dL (ref 0.4–1.2)
GFR: 81.79 mL/min (ref >60.00)
Glucose, Bld: 95 mg/dL (ref 70–99)
Potassium: 3.9 mEq/L (ref 3.5–5.1)
Sodium: 139 mEq/L (ref 135–145)

## 2014-01-09 LAB — CBC WITH DIFFERENTIAL/PLATELET
BASOS ABS: 0 10*3/uL (ref 0.0–0.1)
Basophils Relative: 0.5 % (ref 0.0–3.0)
EOS ABS: 0.1 10*3/uL (ref 0.0–0.7)
Eosinophils Relative: 1.4 % (ref 0.0–5.0)
HCT: 36.9 % (ref 36.0–46.0)
Hemoglobin: 12.3 g/dL (ref 12.0–15.0)
LYMPHS PCT: 16.9 % (ref 12.0–46.0)
Lymphs Abs: 0.9 10*3/uL (ref 0.7–4.0)
MCHC: 33.3 g/dL (ref 30.0–36.0)
MCV: 85.3 fl (ref 78.0–100.0)
MONO ABS: 0.4 10*3/uL (ref 0.1–1.0)
Monocytes Relative: 7.8 % (ref 3.0–12.0)
NEUTROS PCT: 73.4 % (ref 43.0–77.0)
Neutro Abs: 3.8 10*3/uL (ref 1.4–7.7)
PLATELETS: 246 10*3/uL (ref 150.0–400.0)
RBC: 4.32 Mil/uL (ref 3.87–5.11)
RDW: 14.1 % (ref 11.5–14.6)
WBC: 5.1 10*3/uL (ref 4.5–10.5)

## 2014-01-09 LAB — LIPASE: LIPASE: 50 U/L (ref 11.0–59.0)

## 2014-01-09 LAB — HEPATIC FUNCTION PANEL
ALT: 101 U/L — ABNORMAL HIGH (ref 0–35)
AST: 26 U/L (ref 0–37)
Albumin: 4.3 g/dL (ref 3.5–5.2)
Alkaline Phosphatase: 140 U/L — ABNORMAL HIGH (ref 39–117)
BILIRUBIN DIRECT: 0.1 mg/dL (ref 0.0–0.3)
BILIRUBIN TOTAL: 0.6 mg/dL (ref 0.3–1.2)
Total Protein: 7.4 g/dL (ref 6.0–8.3)

## 2014-01-09 LAB — AMYLASE: AMYLASE: 58 U/L (ref 27–131)

## 2014-01-09 LAB — H. PYLORI ANTIBODY, IGG: H PYLORI IGG: NEGATIVE

## 2014-01-09 MED ORDER — OMEPRAZOLE 20 MG PO CPDR: 20.0000 mg | DELAYED_RELEASE_CAPSULE | Freq: Every day | ORAL | Status: DC

## 2014-01-09 NOTE — Patient Instructions (Signed)
Abdominal Pain, Adult °Many things can cause abdominal pain. Usually, abdominal pain is not caused by a disease and will improve without treatment. It can often be observed and treated at home. Your health care provider will do a physical exam and possibly order blood tests and X-rays to help determine the seriousness of your pain. However, in many cases, more time must pass before a clear cause of the pain can be found. Before that point, your health care provider may not know if you need more testing or further treatment. °HOME CARE INSTRUCTIONS  °Monitor your abdominal pain for any changes. The following actions may help to alleviate any discomfort you are experiencing: °· Only take over-the-counter or prescription medicines as directed by your health care provider. °· Do not take laxatives unless directed to do so by your health care provider. °· Try a clear liquid diet (broth, tea, or water) as directed by your health care provider. Slowly move to a bland diet as tolerated. °SEEK MEDICAL CARE IF: °· You have unexplained abdominal pain. °· You have abdominal pain associated with nausea or diarrhea. °· You have pain when you urinate or have a bowel movement. °· You experience abdominal pain that wakes you in the night. °· You have abdominal pain that is worsened or improved by eating food. °· You have abdominal pain that is worsened with eating fatty foods. °SEEK IMMEDIATE MEDICAL CARE IF:  °· Your pain does not go away within 2 hours. °· You have a fever. °· You keep throwing up (vomiting). °· Your pain is felt only in portions of the abdomen, such as the right side or the left lower portion of the abdomen. °· You pass bloody or black tarry stools. °MAKE SURE YOU: °· Understand these instructions.   °· Will watch your condition.   °· Will get help right away if you are not doing well or get worse.   °Document Released: 07/30/2005 Document Revised: 08/10/2013 Document Reviewed: 06/29/2013 °[employer]® Patient  Information ©2014 [employer]. ° °

## 2014-01-09 NOTE — Progress Notes (Signed)
Patient ID: FELICITY PENIX, female   DOB: Jun 26, 1946, 68 y.o.   MRN: [id number]   Subjective:    Patient ID: Arnoldo Lenis, female    DOB: [birth year], 68 y.o.   MRN: [id number] HPI Pt here c/o mid epigastric and Ruq pain several days ago -- it is not bothering her now.  She has hx hiatal hernia.  She has eaten ziti and pain started then.  No NVD, no fever.           Objective:    BP 122/70  Pulse 86  Temp(Src) 98.5 F (36.9 C) (Oral)  Wt 195 lb 3.2 oz (88.542 kg)  SpO2 98% General appearance: alert, cooperative, appears stated age and no distress Neck: no adenopathy, supple, symmetrical, trachea midline and thyroid not enlarged, symmetric, no tenderness/mass/nodules Lungs: clear to auscultation bilaterally Abdomen: abnormal findings:  mild tenderness in the epigastrium and in the RUQ       Assessment & Plan:  1. Hiatal hernia Start meds-- check labs--- check [place] r/o Gb and consider GI referral  - omeprazole (PRILOSEC) 20 MG capsule; Take 1 capsule (20 mg total) by mouth daily.  Dispense: 30 capsule; Refill: 3 - Basic metabolic panel - CBC with Differential - H. pylori antibody, IgG - Hepatic function panel - Amylase - Lipase  2. Abdominal pain, right upper quadrant [place] abd---r/o GB - Basic metabolic panel - CBC with Differential - H. pylori antibody, IgG - Hepatic function panel - Amylase - Lipase - US Abdomen Limited RUQ; Future

## 2014-01-09 NOTE — Progress Notes (Signed)
Pre visit review using our clinic review tool, if applicable. No additional management support is needed unless otherwise documented below in the visit note. 

## 2014-01-13 ENCOUNTER — Ambulatory Visit
Admission: RE | Admit: 2014-01-13 | Discharge: 2014-01-13 | Disposition: A | Discharge status: Home / Self Care | Payer: Federal, State, Local not specified - PPO | Source: Ambulatory Visit | Attending: Family Medicine | Admitting: Family Medicine

## 2014-01-13 ENCOUNTER — Encounter: Payer: Self-pay | Admitting: Family Medicine

## 2014-01-13 DIAGNOSIS — R1011 Right upper quadrant pain: Secondary | ICD-10-CM

## 2014-01-16 ENCOUNTER — Other Ambulatory Visit: Payer: Self-pay | Admitting: Family Medicine

## 2014-01-16 DIAGNOSIS — K8020 Calculus of gallbladder without cholecystitis without obstruction: Secondary | ICD-10-CM

## 2014-01-26 ENCOUNTER — Ambulatory Visit (INDEPENDENT_AMBULATORY_CARE_PROVIDER_SITE_OTHER): Payer: Federal, State, Local not specified - PPO | Admitting: Surgery

## 2014-02-15 ENCOUNTER — Encounter (INDEPENDENT_AMBULATORY_CARE_PROVIDER_SITE_OTHER): Payer: Self-pay | Admitting: Surgery

## 2014-02-15 ENCOUNTER — Ambulatory Visit (INDEPENDENT_AMBULATORY_CARE_PROVIDER_SITE_OTHER): Payer: Federal, State, Local not specified - PPO | Admitting: Surgery

## 2014-02-15 VITALS — BP 150/82 | HR 82 | Temp 97.2°F | Resp 12 | Ht 66.0 in | Wt 198.2 lb

## 2014-02-15 DIAGNOSIS — K449 Diaphragmatic hernia without obstruction or gangrene: Secondary | ICD-10-CM

## 2014-02-15 DIAGNOSIS — E669 Obesity, unspecified: Secondary | ICD-10-CM

## 2014-02-15 DIAGNOSIS — K8010 Calculus of gallbladder with chronic cholecystitis without obstruction: Secondary | ICD-10-CM

## 2014-02-15 NOTE — Patient Instructions (Signed)
Please consider the recommendations that we have given you today:  Consider surgery to remove your gallbladder as I think it as the cause of your symptoms.  It is okay to wait until June after retirement for your cholecystectomy as long as she did not have further attacks.  If you have recurrent/more frequent attacks, consider having surgery done sooner to avoid an emergency situation  See the Handout(s) we have given you.  Please call our office at [phone] if you wish to schedule surgery or if you have further questions / concerns.   Cholecystitis Cholecystitis is an inflammation of your gallbladder. It is usually caused by a buildup of gallstones or sludge (cholelithiasis) in your gallbladder. The gallbladder stores a fluid that helps digest fats (bile). Cholecystitis is serious and needs treatment right away.  CAUSES   Gallstones. Gallstones can block the tube that leads to your gallbladder, causing bile to build up. As bile builds up, the gallbladder becomes inflamed.  Bile duct problems, such as blockage from scarring or kinking.  Tumors. Tumors can stop bile from leaving your gallbladder correctly, causing bile to build up. As bile builds up, the gallbladder becomes inflamed. SYMPTOMS   Nausea.  Vomiting.  Abdominal pain, especially in the upper right area of your abdomen.  Abdominal tenderness or bloating.  Sweating.  Chills.  Fever.  Yellowing of the skin and the whites of the eyes (jaundice). DIAGNOSIS  Your caregiver may order blood tests to look for infection or gallbladder problems. Your caregiver may also order imaging tests, such as an ultrasound or computed tomography (CT) scan. Further tests may include a hepatobiliary iminodiacetic acid (HIDA) scan. This scan allows your caregiver to see your bile move from the liver to the gallbladder and to the small intestine. TREATMENT  A hospital stay is usually necessary to lessen the inflammation of your  gallbladder. You may be required to not eat or drink (fast) for a certain amount of time. You may be given medicine to treat pain or an antibiotic medicine to treat an infection. Surgery may be needed to remove your gallbladder (cholecystectomy) once the inflammation has gone down. Surgery may be needed right away if you develop complications such as death of gallbladder tissue (gangrene) or a tear (perforation) of the gallbladder.  [place] care will depend on your treatment. In general:  If you were given antibiotics, take them as directed. Finish them even if you start to feel better.  Only take over-the-counter or prescription medicines for pain, discomfort, or fever as directed by your caregiver.  Follow a low-fat diet until you see your caregiver again.  Keep all follow-up visits as directed by your caregiver. SEEK IMMEDIATE MEDICAL CARE IF:   Your pain is increasing and not controlled by medicines.  Your pain moves to another part of your abdomen or to your back.  You have a fever.  You have nausea and vomiting. MAKE SURE YOU:  Understand these instructions.  Will watch your condition.  Will get help right away if you are not doing well or get worse. Document Released: 10/20/2005 Document Revised: 01/12/2012 Document Reviewed: 09/05/2011 [facility] Patient Information 2014 [place], [place].  LAPAROSCOPIC SURGERY: POST OP INSTRUCTIONS  1. DIET: Follow a light bland diet the first 24 hours after arrival home, such as soup, liquids, crackers, etc.  Be sure to include lots of fluids daily.  Avoid fast food or heavy meals as your are more likely to get nauseated.  Eat a  low fat the next few days after surgery.   2. Take your usually prescribed home medications unless otherwise directed. 3. PAIN CONTROL: a. Pain is best controlled by a usual combination of three different methods TOGETHER: i. Ice/Heat ii. Over the counter pain medication iii. Prescription pain  medication b. Most patients will experience some swelling and bruising around the incisions.  Ice packs or heating pads (30-60 minutes up to 6 times a day) will help. Use ice for the first few days to help decrease swelling and bruising, then switch to heat to help relax tight/sore spots and speed recovery.  Some people prefer to use ice alone, heat alone, alternating between ice & heat.  Experiment to what works for you.  Swelling and bruising can take several weeks to resolve.   c. It is helpful to take an over-the-counter pain medication regularly for the first few weeks.  Choose one of the following that works best for you: i. Naproxen (Aleve, etc)  Two 220mg  tabs twice a day ii. Ibuprofen (Advil, etc) Three 200mg  tabs four times a day (every meal & bedtime) iii. Acetaminophen (Tylenol, etc) 500-650mg  four times a day (every meal & bedtime) d. A  prescription for pain medication (such as oxycodone, hydrocodone, etc) should be given to you upon discharge.  Take your pain medication as prescribed.  i. If you are having problems/concerns with the prescription medicine (does not control pain, nausea, vomiting, rash, itching, etc), please call us [phone] to see if we need to switch you to a different pain medicine that will work better for you and/or control your side effect better. ii. If you need a refill on your pain medication, please contact your pharmacy.  They will contact our office to request authorization. Prescriptions will not be filled after 5 pm or on week-ends. 4. Avoid getting constipated.  Between the surgery and the pain medications, it is common to experience some constipation.  Increasing fluid intake and taking a fiber supplement (such as Metamucil, Citrucel, FiberCon, MiraLax, etc) 1-2 times a day regularly will usually help prevent this problem from occurring.  A mild laxative (prune juice, Milk of Magnesia, MiraLax, etc) should be taken according to package directions if there  are no bowel movements after 48 hours.   5. Watch out for diarrhea.  If you have many loose bowel movements, simplify your diet to bland foods & liquids for a few days.  Stop any stool softeners and decrease your fiber supplement.  Switching to mild anti-diarrheal medications (Kayopectate, Pepto Bismol) can help.  If this worsens or does not improve, please call us. 6. Wash / shower every day.  You may shower over the dressings as they are waterproof.  Continue to shower over incision(s) after the dressing is off. 7. Remove your waterproof bandages 5 days after surgery.  You may leave the incision open to air.  You may replace a dressing/Band-Aid to cover the incision for comfort if you wish.  8. ACTIVITIES as tolerated:   a. You may resume regular (light) daily activities beginning the next day-such as daily self-care, walking, climbing stairs-gradually increasing activities as tolerated.  If you can walk 30 minutes without difficulty, it is safe to try more intense activity such as jogging, treadmill, bicycling, low-impact aerobics, swimming, etc. b. Save the most intensive and strenuous activity for last such as sit-ups, heavy lifting, contact sports, etc  Refrain from any heavy lifting or straining until you are off narcotics for pain control.  c. DO NOT PUSH THROUGH PAIN.  Let pain be your guide: If it hurts to do something, don't do it.  Pain is your body warning you to avoid that activity for another week until the pain goes down. d. You may drive when you are no longer taking prescription pain medication, you can comfortably wear a seatbelt, and you can safely maneuver your car and apply brakes. e. Dennis Bast may have sexual intercourse when it is comfortable.  9. FOLLOW UP in our office a. Please call [facility] at [phone] to set up an appointment to see your surgeon in the office for a follow-up appointment approximately 2-3 weeks after your surgery. b. Make sure that you call for this appointment  the day you arrive home to insure a convenient appointment time. 10. IF YOU HAVE DISABILITY OR FAMILY LEAVE FORMS, BRING THEM TO THE OFFICE FOR PROCESSING.  DO NOT GIVE THEM TO YOUR DOCTOR.   WHEN TO CALL us [phone]: 1. Poor pain control 2. Reactions / problems with new medications (rash/itching, nausea, etc)  3. Fever over 101.5 F (38.5 C) 4. Inability to urinate 5. Nausea and/or vomiting 6. Worsening swelling or bruising 7. Continued bleeding from incision. 8. Increased pain, redness, or drainage from the incision   The clinic staff is available to answer your questions during regular business hours (8:30am-5pm).  Please don't hesitate to call and ask to speak to one of our nurses for clinical concerns.   If you have a medical emergency, go to the nearest emergency room or call 911.  A surgeon from [facility] is always on call at the [facility], [address], [address], [place], [place]  [address] ? MAIN: [phone] ? TOLL FREE: [phone] ?  FAX [phone] [email or web address]

## 2014-02-15 NOTE — Progress Notes (Signed)
Subjective:     Patient ID: Robin Wall, female   DOB: 05-Jun-1946, 68 y.o.   MRN: [id number]  HPI  Note: This dictation was prepared with Dragon/digital dictation along with [facility] technology. Any transcriptional errors that result from this process are unintentional.       Robin Wall  08/12/1946 [id number]  Patient Care Team: Rosalita Chessman, DO as PCP - General  This patient is a 68 y.o.female who presents today for surgical evaluation at the request of Dr. Etter Sjogren.   Reason for visit: Abdominal pain, gallstones  Pleasant woman with history of intermittent abdominal pains.  Recalls an episode of upper abdominal pain after having some fried food years ago.  His try to avoid that since.  Did have some cream sauce pasta and developed sharp upper abdominal pain.  Went to right side.  Felt nauseated.  Did not vomit but felt like she was about 2.  She was concerned.  Discuss with her physicians.  Workup showed gallstones.  She does have a history of a hiatal hernia.  Usually mild reflux controlled with toms.  This had endoscopy by [facility] in the distant past.  Normal colonoscopy.  Hiatal hernia noted.  No episodes of dysphasia when she irritation bleeding hematochezia or melena.  No personal nor family history of GI/colon cancer, inflammatory bowel disease, irritable bowel syndrome, allergy such as Celiac Sprue, dietary/dairy problems, colitis, ulcers nor gastritis.  No recent sick contacts/gastroenteritis.  No travel outside the country.  No changes in diet.  No dysphagia to solids or liquids.  No significant heartburn or reflux.  No hematochezia, hematemesis, coffee ground emesis.  No evidence of prior gastric/peptic ulceration.    Patient Active Problem List   Diagnosis Date Noted  . Hiatal hernia 01/09/2014  . Obesity (BMI 30-39.9) 07/28/2013  . HIATAL HERNIA 07/31/2008  . FATIGUE 07/31/2008  . HYPERLIPIDEMIA 05/31/2007  . COMMON MIGRAINE 05/31/2007  .  HYPERTENSION 05/31/2007  . ALLERGIC RHINITIS 05/31/2007    Past Medical History  Diagnosis Date  . Hypertension   . Hyperlipidemia   . Allergy   . GERD (gastroesophageal reflux disease)     Past Surgical History  Procedure Laterality Date  . Cataract extraction      Bilateral    History   Social History  . Marital Status: Married    Spouse Name: N/A    Number of Children: N/A  . Years of Education: N/A   Occupational History  . [employer]    Social History Main Topics  . Smoking status: Never Smoker   . Smokeless tobacco: Never Used  . Alcohol Use: No  . Drug Use: No  . Sexual Activity: Yes    Partners: Male   Other Topics Concern  . Not on file   Social History Narrative   Exercising---no          Family History  Problem Relation Age of Onset  . Coronary artery disease    . Breast cancer Paternal Grandmother 30  . Hypertension Mother   . Hyperlipidemia Mother   . Diabetes Father   . Heart disease Father     chf  . Hypertension Father   . Hyperlipidemia Father     Current Outpatient Prescriptions  Medication Sig Dispense Refill  . aspirin 81 MG tablet Take 81 mg by mouth daily.      Marland Kitchen buPROPion (WELLBUTRIN XL) 300 MG 24 hr tablet Take 1 tablet (300 mg total) by mouth every morning.  90 tablet  3  . fluticasone (FLONASE) 50 MCG/ACT nasal spray USE 2 SPRAYS IN EACH NOSTRIL DAILY  16 g  2  . levocetirizine (XYZAL) 5 MG tablet 1 po qd  90 tablet  3  . Multiple Vitamin (MULTIVITAMIN) tablet Take 1 tablet by mouth daily.      Marland Kitchen omeprazole (PRILOSEC) 20 MG capsule Take 1 capsule (20 mg total) by mouth daily.  30 capsule  3  . simvastatin (ZOCOR) 40 MG tablet Take 1 tablet (40 mg total) by mouth at bedtime.  90 tablet  3  . SUMAtriptan (IMITREX) 100 MG tablet Take 1 tablet (100 mg total) by mouth every 2 (two) hours as needed.  10 tablet  2  . valsartan-hydrochlorothiazide (DIOVAN-HCT) 80-12.5 MG per tablet Take 1 tablet by mouth daily.  90 tablet  3   No  current facility-administered medications for this visit.     No Known Allergies  BP 150/82  Pulse 82  Temp(Src) 97.2 F (36.2 C) (Temporal)  Resp 12  Ht 5\' 6"  (1.676 m)  Wt 198 lb 3.2 oz (89.903 kg)  BMI 32.01 kg/m2  No results found.  Results for Robin Wall, Robin Wall (MRN [id number]) as of 02/15/2014 11:56  Ref. Range 01/09/2014 10:26 01/13/2014 08:40  Sodium Latest Range: 135-145 mEq/L 139   Potassium Latest Range: 3.5-5.1 mEq/L 3.9   Chloride Latest Range: 96-112 mEq/L 102   CO2 Latest Range: 19-32 mEq/L 28   BUN Latest Range: 6-23 mg/dL 14   Creatinine Latest Range: 0.4-1.2 mg/dL 0.8   Calcium Latest Range: 8.4-10.5 mg/dL 9.7   Glucose Latest Range: 70-99 mg/dL 95   Alkaline Phosphatase Latest Range: 39-117 U/L 140 (H)   Albumin Latest Range: 3.5-5.2 g/dL 4.3   Amylase Latest Range: 27-131 U/L 58   Lipase Latest Range: 11.0-59.0 U/L 50.0   AST Latest Range: 0-37 U/L 26   ALT Latest Range: 0-35 U/L 101 (H)   Total Protein Latest Range: 6.0-8.3 g/dL 7.4   Bilirubin, Direct Latest Range: 0.0-0.3 mg/dL 0.1   Total Bilirubin Latest Range: 0.3-1.2 mg/dL 0.6   GFR Latest Range: >60.00 mL/min 81.79   WBC Latest Range: 4.5-10.5 K/uL 5.1   RBC Latest Range: 3.87-5.11 Mil/uL 4.32   Hemoglobin Latest Range: 12.0-15.0 g/dL 12.3   HCT Latest Range: 36.0-46.0 % 36.9   MCV Latest Range: 78.0-100.0 fl 85.3   MCHC Latest Range: 30.0-36.0 g/dL 33.3   RDW Latest Range: 11.5-14.6 % 14.1   Platelets Latest Range: 150.0-400.0 K/uL 246.0   Neutrophils Relative % Latest Range: 43.0-77.0 % 73.4   Lymphocytes Relative Latest Range: 12.0-46.0 % 16.9   Monocytes Relative Latest Range: 3.0-12.0 % 7.8   Eosinophils Relative Latest Range: 0.0-5.0 % 1.4   Basophils Relative Latest Range: 0.0-3.0 % 0.5   NEUT# Latest Range: 1.4-7.7 K/uL 3.8   Lymphocytes Absolute Latest Range: 0.7-4.0 K/uL 0.9   Monocytes Absolute Latest Range: 0.1-1.0 K/uL 0.4   Eosinophils Absolute Latest Range: 0.0-0.7 K/uL 0.1     Basophils Absolute Latest Range: 0.0-0.1 K/uL 0.0   H Pylori IgG Latest Range: Negative  Negative   US ABDOMEN LIMITED RUQ No range found  Rpt    Review of Systems  Constitutional: Negative for fever, chills, diaphoresis, appetite change and fatigue.  HENT: Negative for ear discharge, ear pain, sore throat and trouble swallowing.   Eyes: Negative for photophobia, discharge and visual disturbance.  Respiratory: Negative for cough, choking, chest tightness and shortness of breath.   Cardiovascular: Negative for  chest pain and palpitations.  Gastrointestinal: Positive for nausea, vomiting, abdominal pain and abdominal distention. Negative for diarrhea, constipation, anal bleeding and rectal pain.  Endocrine: Negative for cold intolerance and heat intolerance.  Genitourinary: Negative for dysuria, frequency and difficulty urinating.  Musculoskeletal: Negative for gait problem, myalgias and neck pain.  Skin: Negative for color change, pallor and rash.  Allergic/Immunologic: Negative for environmental allergies, food allergies and immunocompromised state.  Neurological: Negative for dizziness, speech difficulty, weakness and numbness.  Hematological: Negative for adenopathy.  Psychiatric/Behavioral: Negative for confusion and agitation. The patient is not nervous/anxious.        Objective:   Physical Exam  Constitutional: She is oriented to person, place, and time. She appears well-developed and well-nourished. No distress.  HENT:  Head: Normocephalic.  Mouth/Throat: Oropharynx is clear and moist. No oropharyngeal exudate.  Eyes: Conjunctivae and EOM are normal. Pupils are equal, round, and reactive to light. No scleral icterus.  Neck: Normal range of motion. Neck supple. No tracheal deviation present.  Cardiovascular: Normal rate, regular rhythm and intact distal pulses.   Pulmonary/Chest: Effort normal and breath sounds normal. No stridor. No respiratory distress. She exhibits no  tenderness.  Abdominal: Soft. She exhibits no distension and no mass. There is tenderness in the right upper quadrant. There is no rigidity, no rebound, no guarding, no tenderness at McBurney's point and negative Murphy's sign. No hernia. Hernia confirmed negative in the right inguinal area and confirmed negative in the left inguinal area.    Genitourinary: No vaginal discharge found.  Musculoskeletal: Normal range of motion. She exhibits no tenderness.       Right elbow: She exhibits normal range of motion.       Left elbow: She exhibits normal range of motion.       Right wrist: She exhibits normal range of motion.       Left wrist: She exhibits normal range of motion.       Right hand: Normal strength noted.       Left hand: Normal strength noted.  Lymphadenopathy:       Head (right side): No posterior auricular adenopathy present.       Head (left side): No posterior auricular adenopathy present.    She has no cervical adenopathy.    She has no axillary adenopathy.       Right: No inguinal adenopathy present.       Left: No inguinal adenopathy present.  Neurological: She is alert and oriented to person, place, and time. No cranial nerve deficit. She exhibits normal muscle tone. Coordination normal.  Skin: Skin is warm and dry. No rash noted. She is not diaphoretic. No erythema.  Psychiatric: She has a normal mood and affect. Her behavior is normal. Judgment and thought content normal.       Assessment:     Classic story of biliary colic with gallstones.  Hiatal hernia with minimal symptoms and different symptom profile.  Rest of the differential diagnosis nonexistent     Plan:     She is very classic story for biliary colic.  While she does have viable hernia, her symptoms are not consistent with reflux.  She has not had symptoms on Protonix but she has been on a strict low-fat diet as well.  She is concerned about getting another intact.  I agree.  I think she would benefit  from cholecystectomy.  She initially seemed skeptical but after discussion [place] convinced.  She was hoping to wait until after she  retires in June.  We will see...  The anatomy & physiology of hepatobiliary & pancreatic function was discussed.  The pathophysiology of gallbladder dysfunction was discussed.  Natural history risks without surgery was discussed.   I feel the risks of no intervention will lead to serious problems that outweigh the operative risks; therefore, I recommended cholecystectomy to remove the pathology.  I explained laparoscopic techniques with possible need for an open approach.  Probable cholangiogram to evaluate the bilary tract was explained as well.    Risks such as bleeding, infection, abscess, leak, injury to other organs, need for further treatment, heart attack, death, and other risks were discussed.  I noted a good likelihood this will help address the problem.  Possibility that this will not correct all abdominal symptoms was explained.  Goals of post-operative recovery were discussed as well.  We will work to minimize complications.  An educational handout further explaining the pathology and treatment options was given as well.  Questions were answered.  The patient expresses understanding & wishes to proceed with surgery.

## 2014-03-13 ENCOUNTER — Telehealth (INDEPENDENT_AMBULATORY_CARE_PROVIDER_SITE_OTHER): Payer: Self-pay | Admitting: Surgery

## 2014-03-13 NOTE — Telephone Encounter (Signed)
?  Rescheduling?

## 2014-03-13 NOTE — Telephone Encounter (Signed)
pt cx sx 04/28/14

## 2014-05-25 ENCOUNTER — Other Ambulatory Visit: Payer: Self-pay | Admitting: Family Medicine

## 2014-05-25 NOTE — Telephone Encounter (Signed)
Refill Request:  omeprazole (PRILOSEC) 20 MG capsule--Take 1 capsule (20 mg total) by mouth daily  Last Filled:  01/09/14 Amt Filled: 30 cap, 3 refills Last OV:  01/09/14  Medication filled per Prescription Refill Protocol.

## 2014-07-21 ENCOUNTER — Other Ambulatory Visit: Payer: Self-pay | Admitting: Family Medicine

## 2014-07-31 ENCOUNTER — Other Ambulatory Visit: Payer: Self-pay

## 2014-07-31 DIAGNOSIS — Z1231 Encounter for screening mammogram for malignant neoplasm of breast: Secondary | ICD-10-CM

## 2014-08-09 ENCOUNTER — Ambulatory Visit
Admission: RE | Admit: 2014-08-09 | Discharge: 2014-08-09 | Disposition: A | Discharge status: Home / Self Care | Payer: Federal, State, Local not specified - PPO | Source: Ambulatory Visit

## 2014-08-09 DIAGNOSIS — Z1231 Encounter for screening mammogram for malignant neoplasm of breast: Secondary | ICD-10-CM

## 2014-10-13 ENCOUNTER — Encounter: Payer: Self-pay | Admitting: Family Medicine

## 2014-10-13 ENCOUNTER — Ambulatory Visit (INDEPENDENT_AMBULATORY_CARE_PROVIDER_SITE_OTHER): Payer: Federal, State, Local not specified - PPO | Admitting: Family Medicine

## 2014-10-13 VITALS — BP 150/82 | HR 82 | Temp 97.0°F | Ht 66.0 in | Wt 204.0 lb

## 2014-10-13 DIAGNOSIS — I10 Essential (primary) hypertension: Secondary | ICD-10-CM

## 2014-10-13 DIAGNOSIS — E785 Hyperlipidemia, unspecified: Secondary | ICD-10-CM

## 2014-10-13 DIAGNOSIS — Z0000 Encounter for general adult medical examination without abnormal findings: Secondary | ICD-10-CM

## 2014-10-13 DIAGNOSIS — Z23 Encounter for immunization: Secondary | ICD-10-CM

## 2014-10-13 NOTE — Patient Instructions (Signed)
Preventive Care for Adults A healthy lifestyle and preventive care can promote health and wellness. Preventive health guidelines for women include the following key practices.  A routine yearly physical is a good way to check with your health care provider about your health and preventive screening. It is a chance to share any concerns and updates on your health and to receive a thorough exam.  Visit your dentist for a routine exam and preventive care every 6 months. Brush your teeth twice a day and floss once a day. Good oral hygiene prevents tooth decay and gum disease.  The frequency of eye exams is based on your age, health, family medical history, use of contact lenses, and other factors. Follow your health care provider's recommendations for frequency of eye exams.  Eat a healthy diet. Foods like vegetables, fruits, whole grains, low-fat dairy products, and lean protein foods contain the nutrients you need without too many calories. Decrease your intake of foods high in solid fats, added sugars, and salt. Eat the right amount of calories for you.Get information about a proper diet from your health care provider, if necessary.  Regular physical exercise is one of the most important things you can do for your health. Most adults should get at least 150 minutes of moderate-intensity exercise (any activity that increases your heart rate and causes you to sweat) each week. In addition, most adults need muscle-strengthening exercises on 2 or more days a week.  Maintain a healthy weight. The body mass index (BMI) is a screening tool to identify possible weight problems. It provides an estimate of body fat based on height and weight. Your health care provider can find your BMI and can help you achieve or maintain a healthy weight.For adults 20 years and older:  A BMI below 18.5 is considered underweight.  A BMI of 18.5 to 24.9 is normal.  A BMI of 25 to 29.9 is considered overweight.  A BMI of  30 and above is considered obese.  Maintain normal blood lipids and cholesterol levels by exercising and minimizing your intake of saturated fat. Eat a balanced diet with plenty of fruit and vegetables. Blood tests for lipids and cholesterol should begin at age 76 and be repeated every 5 years. If your lipid or cholesterol levels are high, you are over 50, or you are at high risk for heart disease, you may need your cholesterol levels checked more frequently.Ongoing high lipid and cholesterol levels should be treated with medicines if diet and exercise are not working.  If you smoke, find out from your health care provider how to quit. If you do not use tobacco, do not start.  Lung cancer screening is recommended for adults aged 22-80 years who are at high risk for developing lung cancer because of a history of smoking. A yearly low-dose CT scan of the lungs is recommended for people who have at least a 30-pack-year history of smoking and are a current smoker or have quit within the past 15 years. A pack year of smoking is smoking an average of 1 pack of cigarettes a day for 1 year (for example: 1 pack a day for 30 years or 2 packs a day for 15 years). Yearly screening should continue until the smoker has stopped smoking for at least 15 years. Yearly screening should be stopped for people who develop a health problem that would prevent them from having lung cancer treatment.  If you are pregnant, do not drink alcohol. If you are breastfeeding,  be very cautious about drinking alcohol. If you are not pregnant and choose to drink alcohol, do not have more than 1 drink per day. One drink is considered to be 12 ounces (355 mL) of beer, 5 ounces (148 mL) of wine, or 1.5 ounces (44 mL) of liquor.  Avoid use of street drugs. Do not share needles with anyone. Ask for help if you need support or instructions about stopping the use of drugs.  High blood pressure causes heart disease and increases the risk of  stroke. Your blood pressure should be checked at least every 1 to 2 years. Ongoing high blood pressure should be treated with medicines if weight loss and exercise do not work.  If you are 3-86 years old, ask your health care provider if you should take aspirin to prevent strokes.  Diabetes screening involves taking a blood sample to check your fasting blood sugar level. This should be done once every 3 years, after age 67, if you are within normal weight and without risk factors for diabetes. Testing should be considered at a younger age or be carried out more frequently if you are overweight and have at least 1 risk factor for diabetes.  Breast cancer screening is essential preventive care for women. You should practice "breast self-awareness." This means understanding the normal appearance and feel of your breasts and may include breast self-examination. Any changes detected, no matter how small, should be reported to a health care provider. Women in their 8s and 30s should have a clinical breast exam (CBE) by a health care provider as part of a regular health exam every 1 to 3 years. After age 70, women should have a CBE every year. Starting at age 25, women should consider having a mammogram (breast X-ray test) every year. Women who have a family history of breast cancer should talk to their health care provider about genetic screening. Women at a high risk of breast cancer should talk to their health care providers about having an MRI and a mammogram every year.  Breast cancer gene (BRCA)-related cancer risk assessment is recommended for women who have family members with BRCA-related cancers. BRCA-related cancers include breast, ovarian, tubal, and peritoneal cancers. Having family members with these cancers may be associated with an increased risk for harmful changes (mutations) in the breast cancer genes BRCA1 and BRCA2. Results of the assessment will determine the need for genetic counseling and  BRCA1 and BRCA2 testing.  Routine pelvic exams to screen for cancer are no longer recommended for nonpregnant women who are considered low risk for cancer of the pelvic organs (ovaries, uterus, and vagina) and who do not have symptoms. Ask your health care provider if a screening pelvic exam is right for you.  If you have had past treatment for cervical cancer or a condition that could lead to cancer, you need Pap tests and screening for cancer for at least 20 years after your treatment. If Pap tests have been discontinued, your risk factors (such as having a new sexual partner) need to be reassessed to determine if screening should be resumed. Some women have medical problems that increase the chance of getting cervical cancer. In these cases, your health care provider may recommend more frequent screening and Pap tests.  The HPV test is an additional test that may be used for cervical cancer screening. The HPV test looks for the virus that can cause the cell changes on the cervix. The cells collected during the Pap test can be  tested for HPV. The HPV test could be used to screen women aged 30 years and older, and should be used in women of any age who have unclear Pap test results. After the age of 30, women should have HPV testing at the same frequency as a Pap test.  Colorectal cancer can be detected and often prevented. Most routine colorectal cancer screening begins at the age of 50 years and continues through age 75 years. However, your health care provider may recommend screening at an earlier age if you have risk factors for colon cancer. On a yearly basis, your health care provider may provide home test kits to check for hidden blood in the stool. Use of a small camera at the end of a tube, to directly examine the colon (sigmoidoscopy or colonoscopy), can detect the earliest forms of colorectal cancer. Talk to your health care provider about this at age 50, when routine screening begins. Direct  exam of the colon should be repeated every 5-10 years through age 75 years, unless early forms of pre-cancerous polyps or small growths are found.  People who are at an increased risk for hepatitis B should be screened for this virus. You are considered at high risk for hepatitis B if:  You were born in a country where hepatitis B occurs often. Talk with your health care provider about which countries are considered high risk.  Your parents were born in a high-risk country and you have not received a shot to protect against hepatitis B (hepatitis B vaccine).  You have HIV or AIDS.  You use needles to inject street drugs.  You live with, or have sex with, someone who has hepatitis B.  You get hemodialysis treatment.  You take certain medicines for conditions like cancer, organ transplantation, and autoimmune conditions.  Hepatitis C blood testing is recommended for all people born from 1945 through 1965 and any individual with known risks for hepatitis C.  Practice safe sex. Use condoms and avoid high-risk sexual practices to reduce the spread of sexually transmitted infections (STIs). STIs include gonorrhea, chlamydia, syphilis, trichomonas, herpes, HPV, and human immunodeficiency virus (HIV). Herpes, HIV, and HPV are viral illnesses that have no cure. They can result in disability, cancer, and death.  You should be screened for sexually transmitted illnesses (STIs) including gonorrhea and chlamydia if:  You are sexually active and are younger than 24 years.  You are older than 24 years and your health care provider tells you that you are at risk for this type of infection.  Your sexual activity has changed since you were last screened and you are at an increased risk for chlamydia or gonorrhea. Ask your health care provider if you are at risk.  If you are at risk of being infected with HIV, it is recommended that you take a prescription medicine daily to prevent HIV infection. This is  called preexposure prophylaxis (PrEP). You are considered at risk if:  You are a heterosexual woman, are sexually active, and are at increased risk for HIV infection.  You take drugs by injection.  You are sexually active with a partner who has HIV.  Talk with your health care provider about whether you are at high risk of being infected with HIV. If you choose to begin PrEP, you should first be tested for HIV. You should then be tested every 3 months for as long as you are taking PrEP.  Osteoporosis is a disease in which the bones lose minerals and strength   with aging. This can result in serious bone fractures or breaks. The risk of osteoporosis can be identified using a bone density scan. Women ages 65 years and over and women at risk for fractures or osteoporosis should discuss screening with their health care providers. Ask your health care provider whether you should take a calcium supplement or vitamin D to reduce the rate of osteoporosis.  Menopause can be associated with physical symptoms and risks. Hormone replacement therapy is available to decrease symptoms and risks. You should talk to your health care provider about whether hormone replacement therapy is right for you.  Use sunscreen. Apply sunscreen liberally and repeatedly throughout the day. You should seek shade when your shadow is shorter than you. Protect yourself by wearing long sleeves, pants, a wide-brimmed hat, and sunglasses year round, whenever you are outdoors.  Once a month, do a whole body skin exam, using a mirror to look at the skin on your back. Tell your health care provider of new moles, moles that have irregular borders, moles that are larger than a pencil eraser, or moles that have changed in shape or color.  Stay current with required vaccines (immunizations).  Influenza vaccine. All adults should be immunized every year.  Tetanus, diphtheria, and acellular pertussis (Td, Tdap) vaccine. Pregnant women should  receive 1 dose of Tdap vaccine during each pregnancy. The dose should be obtained regardless of the length of time since the last dose. Immunization is preferred during the 27th-36th week of gestation. An adult who has not previously received Tdap or who does not know her vaccine status should receive 1 dose of Tdap. This initial dose should be followed by tetanus and diphtheria toxoids (Td) booster doses every 10 years. Adults with an unknown or incomplete history of completing a 3-dose immunization series with Td-containing vaccines should begin or complete a primary immunization series including a Tdap dose. Adults should receive a Td booster every 10 years.  Varicella vaccine. An adult without evidence of immunity to varicella should receive 2 doses or a second dose if she has previously received 1 dose. Pregnant females who do not have evidence of immunity should receive the first dose after pregnancy. This first dose should be obtained before leaving the health care facility. The second dose should be obtained 4-8 weeks after the first dose.  Human papillomavirus (HPV) vaccine. Females aged 13-26 years who have not received the vaccine previously should obtain the 3-dose series. The vaccine is not recommended for use in pregnant females. However, pregnancy testing is not needed before receiving a dose. If a female is found to be pregnant after receiving a dose, no treatment is needed. In that case, the remaining doses should be delayed until after the pregnancy. Immunization is recommended for any person with an immunocompromised condition through the age of 26 years if she did not get any or all doses earlier. During the 3-dose series, the second dose should be obtained 4-8 weeks after the first dose. The third dose should be obtained 24 weeks after the first dose and 16 weeks after the second dose.  Zoster vaccine. One dose is recommended for adults aged 60 years or older unless certain conditions are  present.  Measles, mumps, and rubella (MMR) vaccine. Adults born before 1957 generally are considered immune to measles and mumps. Adults born in 1957 or later should have 1 or more doses of MMR vaccine unless there is a contraindication to the vaccine or there is laboratory evidence of immunity to   each of the three diseases. A routine second dose of MMR vaccine should be obtained at least 28 days after the first dose for students attending postsecondary schools, health care workers, or international travelers. People who received inactivated measles vaccine or an unknown type of measles vaccine during 1963-1967 should receive 2 doses of MMR vaccine. People who received inactivated mumps vaccine or an unknown type of mumps vaccine before 1979 and are at high risk for mumps infection should consider immunization with 2 doses of MMR vaccine. For females of childbearing age, rubella immunity should be determined. If there is no evidence of immunity, females who are not pregnant should be vaccinated. If there is no evidence of immunity, females who are pregnant should delay immunization until after pregnancy. Unvaccinated health care workers born before 1957 who lack laboratory evidence of measles, mumps, or rubella immunity or laboratory confirmation of disease should consider measles and mumps immunization with 2 doses of MMR vaccine or rubella immunization with 1 dose of MMR vaccine.  Pneumococcal 13-valent conjugate (PCV13) vaccine. When indicated, a person who is uncertain of her immunization history and has no record of immunization should receive the PCV13 vaccine. An adult aged 19 years or older who has certain medical conditions and has not been previously immunized should receive 1 dose of PCV13 vaccine. This PCV13 should be followed with a dose of pneumococcal polysaccharide (PPSV23) vaccine. The PPSV23 vaccine dose should be obtained at least 8 weeks after the dose of PCV13 vaccine. An adult aged 19  years or older who has certain medical conditions and previously received 1 or more doses of PPSV23 vaccine should receive 1 dose of PCV13. The PCV13 vaccine dose should be obtained 1 or more years after the last PPSV23 vaccine dose.  Pneumococcal polysaccharide (PPSV23) vaccine. When PCV13 is also indicated, PCV13 should be obtained first. All adults aged 65 years and older should be immunized. An adult younger than age 65 years who has certain medical conditions should be immunized. Any person who resides in a nursing home or long-term care facility should be immunized. An adult smoker should be immunized. People with an immunocompromised condition and certain other conditions should receive both PCV13 and PPSV23 vaccines. People with human immunodeficiency virus (HIV) infection should be immunized as soon as possible after diagnosis. Immunization during chemotherapy or radiation therapy should be avoided. Routine use of PPSV23 vaccine is not recommended for American Indians, [place] Natives, or people younger than 65 years unless there are medical conditions that require PPSV23 vaccine. When indicated, people who have unknown immunization and have no record of immunization should receive PPSV23 vaccine. One-time revaccination 5 years after the first dose of PPSV23 is recommended for people aged 19-64 years who have chronic kidney failure, nephrotic syndrome, asplenia, or immunocompromised conditions. People who received 1-2 doses of PPSV23 before age 65 years should receive another dose of PPSV23 vaccine at age 65 years or later if at least 5 years have passed since the previous dose. Doses of PPSV23 are not needed for people immunized with PPSV23 at or after age 65 years.  Meningococcal vaccine. Adults with asplenia or persistent complement component deficiencies should receive 2 doses of quadrivalent meningococcal conjugate (MenACWY-D) vaccine. The doses should be obtained at least 2 months apart.  Microbiologists working with certain meningococcal bacteria, military recruits, people at risk during an outbreak, and people who travel to or live in countries with a high rate of meningitis should be immunized. A first-year college student up through age   21 years who is living in a residence hall should receive a dose if she did not receive a dose on or after her 16th birthday. Adults who have certain high-risk conditions should receive one or more doses of vaccine.  Hepatitis A vaccine. Adults who wish to be protected from this disease, have certain high-risk conditions, work with hepatitis A-infected animals, work in hepatitis A research labs, or travel to or work in countries with a high rate of hepatitis A should be immunized. Adults who were previously unvaccinated and who anticipate close contact with an international adoptee during the first 60 days after arrival in the [place] from a country with a high rate of hepatitis A should be immunized.  Hepatitis B vaccine. Adults who wish to be protected from this disease, have certain high-risk conditions, may be exposed to blood or other infectious body fluids, are household contacts or sex partners of hepatitis B positive people, are clients or workers in certain care facilities, or travel to or work in countries with a high rate of hepatitis B should be immunized.  Haemophilus influenzae type b (Hib) vaccine. A previously unvaccinated person with asplenia or sickle cell disease or having a scheduled splenectomy should receive 1 dose of Hib vaccine. Regardless of previous immunization, a recipient of a hematopoietic stem cell transplant should receive a 3-dose series 6-12 months after her successful transplant. Hib vaccine is not recommended for adults with HIV infection. Preventive Services / Frequency Ages 64 to 68 years  Blood pressure check.** / Every 1 to 2 years.  Lipid and cholesterol check.** / Every 5 years beginning at age  22.  Clinical breast exam.** / Every 3 years for women in their 88s and 53s.  BRCA-related cancer risk assessment.** / For women who have family members with a BRCA-related cancer (breast, ovarian, tubal, or peritoneal cancers).  Pap test.** / Every 2 years from ages 90 through 51. Every 3 years starting at age 21 through age 56 or 3 with a history of 3 consecutive normal Pap tests.  HPV screening.** / Every 3 years from ages 24 through ages 1 to 46 with a history of 3 consecutive normal Pap tests.  Hepatitis C blood test.** / For any individual with known risks for hepatitis C.  Skin self-exam. / Monthly.  Influenza vaccine. / Every year.  Tetanus, diphtheria, and acellular pertussis (Tdap, Td) vaccine.** / Consult your health care provider. Pregnant women should receive 1 dose of Tdap vaccine during each pregnancy. 1 dose of Td every 10 years.  Varicella vaccine.** / Consult your health care provider. Pregnant females who do not have evidence of immunity should receive the first dose after pregnancy.  HPV vaccine. / 3 doses over 6 months, if 72 and younger. The vaccine is not recommended for use in pregnant females. However, pregnancy testing is not needed before receiving a dose.  Measles, mumps, rubella (MMR) vaccine.** / You need at least 1 dose of MMR if you were born in 1957 or later. You may also need a 2nd dose. For females of childbearing age, rubella immunity should be determined. If there is no evidence of immunity, females who are not pregnant should be vaccinated. If there is no evidence of immunity, females who are pregnant should delay immunization until after pregnancy.  Pneumococcal 13-valent conjugate (PCV13) vaccine.** / Consult your health care provider.  Pneumococcal polysaccharide (PPSV23) vaccine.** / 1 to 2 doses if you smoke cigarettes or if you have certain conditions.  Meningococcal vaccine.** /  1 dose if you are age 19 to 21 years and a first-year college  student living in a residence hall, or have one of several medical conditions, you need to get vaccinated against meningococcal disease. You may also need additional booster doses.  Hepatitis A vaccine.** / Consult your health care provider.  Hepatitis B vaccine.** / Consult your health care provider.  Haemophilus influenzae type b (Hib) vaccine.** / Consult your health care provider. Ages 40 to 64 years  Blood pressure check.** / Every 1 to 2 years.  Lipid and cholesterol check.** / Every 5 years beginning at age 20 years.  Lung cancer screening. / Every year if you are aged 55-80 years and have a 30-pack-year history of smoking and currently smoke or have quit within the past 15 years. Yearly screening is stopped once you have quit smoking for at least 15 years or develop a health problem that would prevent you from having lung cancer treatment.  Clinical breast exam.** / Every year after age 40 years.  BRCA-related cancer risk assessment.** / For women who have family members with a BRCA-related cancer (breast, ovarian, tubal, or peritoneal cancers).  Mammogram.** / Every year beginning at age 40 years and continuing for as long as you are in good health. Consult with your health care provider.  Pap test.** / Every 3 years starting at age 30 years through age 65 or 70 years with a history of 3 consecutive normal Pap tests.  HPV screening.** / Every 3 years from ages 30 years through ages 65 to 70 years with a history of 3 consecutive normal Pap tests.  Fecal occult blood test (FOBT) of stool. / Every year beginning at age 50 years and continuing until age 75 years. You may not need to do this test if you get a colonoscopy every 10 years.  Flexible sigmoidoscopy or colonoscopy.** / Every 5 years for a flexible sigmoidoscopy or every 10 years for a colonoscopy beginning at age 50 years and continuing until age 75 years.  Hepatitis C blood test.** / For all people born from 1945 through  1965 and any individual with known risks for hepatitis C.  Skin self-exam. / Monthly.  Influenza vaccine. / Every year.  Tetanus, diphtheria, and acellular pertussis (Tdap/Td) vaccine.** / Consult your health care provider. Pregnant women should receive 1 dose of Tdap vaccine during each pregnancy. 1 dose of Td every 10 years.  Varicella vaccine.** / Consult your health care provider. Pregnant females who do not have evidence of immunity should receive the first dose after pregnancy.  Zoster vaccine.** / 1 dose for adults aged 60 years or older.  Measles, mumps, rubella (MMR) vaccine.** / You need at least 1 dose of MMR if you were born in 1957 or later. You may also need a 2nd dose. For females of childbearing age, rubella immunity should be determined. If there is no evidence of immunity, females who are not pregnant should be vaccinated. If there is no evidence of immunity, females who are pregnant should delay immunization until after pregnancy.  Pneumococcal 13-valent conjugate (PCV13) vaccine.** / Consult your health care provider.  Pneumococcal polysaccharide (PPSV23) vaccine.** / 1 to 2 doses if you smoke cigarettes or if you have certain conditions.  Meningococcal vaccine.** / Consult your health care provider.  Hepatitis A vaccine.** / Consult your health care provider.  Hepatitis B vaccine.** / Consult your health care provider.  Haemophilus influenzae type b (Hib) vaccine.** / Consult your health care provider. Ages 65   years and over  Blood pressure check.** / Every 1 to 2 years.  Lipid and cholesterol check.** / Every 5 years beginning at age 22 years.  Lung cancer screening. / Every year if you are aged 73-80 years and have a 30-pack-year history of smoking and currently smoke or have quit within the past 15 years. Yearly screening is stopped once you have quit smoking for at least 15 years or develop a health problem that would prevent you from having lung cancer  treatment.  Clinical breast exam.** / Every year after age 4 years.  BRCA-related cancer risk assessment.** / For women who have family members with a BRCA-related cancer (breast, ovarian, tubal, or peritoneal cancers).  Mammogram.** / Every year beginning at age 40 years and continuing for as long as you are in good health. Consult with your health care provider.  Pap test.** / Every 3 years starting at age 9 years through age 34 or 91 years with 3 consecutive normal Pap tests. Testing can be stopped between 65 and 70 years with 3 consecutive normal Pap tests and no abnormal Pap or HPV tests in the past 10 years.  HPV screening.** / Every 3 years from ages 57 years through ages 64 or 45 years with a history of 3 consecutive normal Pap tests. Testing can be stopped between 65 and 70 years with 3 consecutive normal Pap tests and no abnormal Pap or HPV tests in the past 10 years.  Fecal occult blood test (FOBT) of stool. / Every year beginning at age 15 years and continuing until age 17 years. You may not need to do this test if you get a colonoscopy every 10 years.  Flexible sigmoidoscopy or colonoscopy.** / Every 5 years for a flexible sigmoidoscopy or every 10 years for a colonoscopy beginning at age 86 years and continuing until age 71 years.  Hepatitis C blood test.** / For all people born from [birth year] through 1965 and any individual with known risks for hepatitis C.  Osteoporosis screening.** / A one-time screening for women ages 83 years and over and women at risk for fractures or osteoporosis.  Skin self-exam. / Monthly.  Influenza vaccine. / Every year.  Tetanus, diphtheria, and acellular pertussis (Tdap/Td) vaccine.** / 1 dose of Td every 10 years.  Varicella vaccine.** / Consult your health care provider.  Zoster vaccine.** / 1 dose for adults aged 61 years or older.  Pneumococcal 13-valent conjugate (PCV13) vaccine.** / Consult your health care provider.  Pneumococcal  polysaccharide (PPSV23) vaccine.** / 1 dose for all adults aged 28 years and older.  Meningococcal vaccine.** / Consult your health care provider.  Hepatitis A vaccine.** / Consult your health care provider.  Hepatitis B vaccine.** / Consult your health care provider.  Haemophilus influenzae type b (Hib) vaccine.** / Consult your health care provider. ** Family history and personal history of risk and conditions may change your health care provider's recommendations. Document Released: 12/16/2001 Document Revised: 03/06/2014 Document Reviewed: 03/17/2011 [facility] Patient Information 2015 [place], [place]. This information is not intended to replace advice given to you by your health care provider. Make sure you discuss any questions you have with your health care provider.

## 2014-10-13 NOTE — Progress Notes (Signed)
Subjective:    Robin Wall is a 68 y.o. female who presents for Medicare Annual/Subsequent preventive examination.  Preventive Screening-Counseling & Management  Tobacco History  Smoking status  . Never Smoker   Smokeless tobacco  . Never Used     Problems Prior to Visit 1. Depressed-- since retiring   Current Problems (verified) Patient Active Problem List   Diagnosis Date Noted  . Chronic cholecystitis with calculus 02/15/2014  . Hiatal hernia 01/09/2014  . Obesity (BMI 30-39.9) 07/28/2013  . FATIGUE 07/31/2008  . HYPERLIPIDEMIA 05/31/2007  . COMMON MIGRAINE 05/31/2007  . HYPERTENSION 05/31/2007  . ALLERGIC RHINITIS 05/31/2007    Medications Prior to Visit Current Outpatient Prescriptions on File Prior to Visit  Medication Sig Dispense Refill  . aspirin 81 MG tablet Take 81 mg by mouth daily.    Marland Kitchen buPROPion (WELLBUTRIN XL) 300 MG 24 hr tablet TAKE 1 TABLET BY MOUTH EVERY MORNING 90 tablet 0  . fluticasone (FLONASE) 50 MCG/ACT nasal spray USE 2 SPRAYS IN EACH NOSTRIL DAILY 16 g 2  . levocetirizine (XYZAL) 5 MG tablet TAKE 1 TABLET BY MOUTH EVERY DAY 90 tablet 3  . Multiple Vitamin (MULTIVITAMIN) tablet Take 1 tablet by mouth daily.    Marland Kitchen omeprazole (PRILOSEC) 20 MG capsule TAKE 1 CAPSULE (20 MG TOTAL) BY MOUTH DAILY. 30 capsule 5  . simvastatin (ZOCOR) 40 MG tablet TAKE 1 TABLET BY MOUTH EVERY DAY AT BEDTIME 90 tablet 0  . SUMAtriptan (IMITREX) 100 MG tablet Take 1 tablet (100 mg total) by mouth every 2 (two) hours as needed. 10 tablet 2  . valsartan-hydrochlorothiazide (DIOVAN-HCT) 80-12.5 MG per tablet Take 1 tablet by mouth daily. Office visit due now 90 tablet 0   No current facility-administered medications on file prior to visit.    Current Medications (verified) Current Outpatient Prescriptions  Medication Sig Dispense Refill  . aspirin 81 MG tablet Take 81 mg by mouth daily.    Marland Kitchen buPROPion (WELLBUTRIN XL) 300 MG 24 hr tablet TAKE 1 TABLET BY MOUTH EVERY  MORNING 90 tablet 0  . fluticasone (FLONASE) 50 MCG/ACT nasal spray USE 2 SPRAYS IN EACH NOSTRIL DAILY 16 g 2  . levocetirizine (XYZAL) 5 MG tablet TAKE 1 TABLET BY MOUTH EVERY DAY 90 tablet 3  . Multiple Vitamin (MULTIVITAMIN) tablet Take 1 tablet by mouth daily.    Marland Kitchen omeprazole (PRILOSEC) 20 MG capsule TAKE 1 CAPSULE (20 MG TOTAL) BY MOUTH DAILY. 30 capsule 5  . simvastatin (ZOCOR) 40 MG tablet TAKE 1 TABLET BY MOUTH EVERY DAY AT BEDTIME 90 tablet 0  . SUMAtriptan (IMITREX) 100 MG tablet Take 1 tablet (100 mg total) by mouth every 2 (two) hours as needed. 10 tablet 2  . valsartan-hydrochlorothiazide (DIOVAN-HCT) 80-12.5 MG per tablet Take 1 tablet by mouth daily. Office visit due now 90 tablet 0   No current facility-administered medications for this visit.     Allergies (verified) Review of patient's allergies indicates no known allergies.   PAST HISTORY  Family History Family History  Problem Relation Age of Onset  . Coronary artery disease    . Breast cancer Paternal Grandmother 69  . Hypertension Mother   . Hyperlipidemia Mother   . Diabetes Father   . Heart disease Father     chf  . Hypertension Father   . Hyperlipidemia Father     Social History History  Substance Use Topics  . Smoking status: Never Smoker   . Smokeless tobacco: Never Used  . Alcohol Use:  No     Are there smokers in your home (other than you)? No  Risk Factors Current exercise habits: home gym equipment  Dietary issues discussed: na   Cardiac risk factors: advanced age (older than 18 for men, 60 for women).  Depression Screen (Note: if answer to either of the following is "Yes", a more complete depression screening is indicated)   Over the past two weeks, have you felt down, depressed or hopeless? Yes  Over the past two weeks, have you felt little interest or pleasure in doing things? Yes  Have you lost interest or pleasure in daily life? No  Do you often feel hopeless? No  Do you cry  easily over simple problems? No  Activities of Daily Living In your present state of health, do you have any difficulty performing the following activities?:  Driving? No Managing money?  No Feeding yourself? No Getting from bed to chair? No Climbing a flight of stairs? No Preparing food and eating?: No Bathing or showering? No Getting dressed: No Getting to the toilet? No Using the toilet:No Moving around from place to place: No In the past year have you fallen or had a near fall?:No   Are you sexually active?  Yes  Do you have more than one partner?  No  Hearing Difficulties: No Do you often ask people to speak up or repeat themselves? No Do you experience ringing or noises in your ears? No Do you have difficulty understanding soft or whispered voices? No   Do you feel that you have a problem with memory? No  Do you often misplace items? No  Do you feel safe at home?  Yes  Cognitive Testing  Alert? Yes  Normal Appearance?Yes  Oriented to person? Yes  Place? Yes   Time? Yes  Recall of three objects?  Yes  Can perform simple calculations? Yes  Displays appropriate judgment?Yes  Can read the correct time from a watch face?Yes   Advanced Directives have been discussed with the patient? Yes  List the Names of Other Physician/Practitioners you currently use: 1.    Indicate any recent Medical Services you may have received from other than [facility] providers in the past year (date may be approximate).  Immunization History  Administered Date(s) Administered  . Influenza Split 08/26/2011, 07/27/2012  . Influenza Whole 08/03/2009, 07/30/2010  . Influenza,inj,Quad PF,36+ Mos 07/28/2013, 10/13/2014  . Pneumococcal Conjugate-13 10/13/2014  . Pneumococcal Polysaccharide-23 07/29/2011  . Td 09/20/2002  . Tdap 04/23/2011  . Zoster 11/16/2009    Screening Tests Health Maintenance  Topic Date Due  . COLONOSCOPY  11/05/2013  . INFLUENZA VACCINE  06/04/2015  . MAMMOGRAM   08/09/2016  . TETANUS/TDAP  04/22/2021  . PNEUMOCOCCAL POLYSACCHARIDE VACCINE AGE 33 AND OVER  Completed  . ZOSTAVAX  Completed    All answers were reviewed with the patient and necessary referrals were made:  Garnet Koyanagi, DO   10/13/2014   History reviewed:  She  has a past medical history of Hypertension; Hyperlipidemia; Allergy; and GERD (gastroesophageal reflux disease). She  does not have any pertinent problems on file. She  has past surgical history that includes Cataract extraction. Her family history includes Breast cancer (age of onset: 85) in her paternal grandmother; Coronary artery disease in an other family member; Diabetes in her father; Heart disease in her father; Hyperlipidemia in her father and mother; Hypertension in her father and mother. She  reports that she has never smoked. She has never used smokeless tobacco.  She reports that she does not drink alcohol or use illicit drugs. She has a current medication list which includes the following prescription(s): aspirin, bupropion, fluticasone, levocetirizine, multivitamin, omeprazole, simvastatin, sumatriptan, and valsartan-hydrochlorothiazide. Current Outpatient Prescriptions on File Prior to Visit  Medication Sig Dispense Refill  . aspirin 81 MG tablet Take 81 mg by mouth daily.    Marland Kitchen buPROPion (WELLBUTRIN XL) 300 MG 24 hr tablet TAKE 1 TABLET BY MOUTH EVERY MORNING 90 tablet 0  . fluticasone (FLONASE) 50 MCG/ACT nasal spray USE 2 SPRAYS IN EACH NOSTRIL DAILY 16 g 2  . levocetirizine (XYZAL) 5 MG tablet TAKE 1 TABLET BY MOUTH EVERY DAY 90 tablet 3  . Multiple Vitamin (MULTIVITAMIN) tablet Take 1 tablet by mouth daily.    Marland Kitchen omeprazole (PRILOSEC) 20 MG capsule TAKE 1 CAPSULE (20 MG TOTAL) BY MOUTH DAILY. 30 capsule 5  . simvastatin (ZOCOR) 40 MG tablet TAKE 1 TABLET BY MOUTH EVERY DAY AT BEDTIME 90 tablet 0  . SUMAtriptan (IMITREX) 100 MG tablet Take 1 tablet (100 mg total) by mouth every 2 (two) hours as needed. 10 tablet  2  . valsartan-hydrochlorothiazide (DIOVAN-HCT) 80-12.5 MG per tablet Take 1 tablet by mouth daily. Office visit due now 90 tablet 0   No current facility-administered medications on file prior to visit.   She has No Known Allergies.  Review of Systems  Review of Systems  Constitutional: Negative for activity change, appetite change and fatigue.  HENT: Negative for hearing loss, congestion, tinnitus and ear discharge.   Eyes: Negative for visual disturbance (see optho q2y -- vision corrected to 20/20 with glasses).  Respiratory: Negative for cough, chest tightness and shortness of breath.   Cardiovascular: Negative for chest pain, palpitations and leg swelling.  Gastrointestinal: Negative for abdominal pain, diarrhea, constipation and abdominal distention.  Genitourinary: Negative for urgency, frequency, decreased urine volume and difficulty urinating.  Musculoskeletal: Negative for back pain, arthralgias and gait problem.  Skin: Negative for color change, pallor and rash.  Neurological: Negative for dizziness, light-headedness, numbness and headaches.  Hematological: Negative for adenopathy. Does not bruise/bleed easily.  Psychiatric/Behavioral: Negative for suicidal ideas, confusion, sleep disturbance, self-injury, dysphoric mood, decreased concentration and agitation.  Pt is able to read and write and can do all ADLs No risk for falling No abuse/ violence in home      Objective:     Vision by Snellen chart: opth  Body mass index is 32.94 kg/(m^2). BP 150/82 mmHg  Pulse 82  Temp(Src) 97 F (36.1 C) (Oral)  Ht 5\' 6"  (1.676 m)  Wt 204 lb (92.534 kg)  BMI 32.94 kg/m2  SpO2 98%  LMP  (LMP Unknown)  BP 150/82 mmHg  Pulse 82  Temp(Src) 97 F (36.1 C) (Oral)  Ht 5\' 6"  (1.676 m)  Wt 204 lb (92.534 kg)  BMI 32.94 kg/m2  SpO2 98%  LMP  (LMP Unknown) General appearance: alert, cooperative, appears stated age and no distress Head: Normocephalic, without obvious  abnormality, atraumatic Eyes: conjunctivae/corneas clear. PERRL, EOM's intact. Fundi benign. Ears: normal TM's and external ear canals both ears Nose: Nares normal. Septum midline. Mucosa normal. No drainage or sinus tenderness. Throat: lips, mucosa, and tongue normal; teeth and gums normal Neck: no adenopathy, no carotid bruit, no JVD, supple, symmetrical, trachea midline and thyroid not enlarged, symmetric, no tenderness/mass/nodules Back: symmetric, no curvature. ROM normal. No CVA tenderness. Lungs: clear to auscultation bilaterally Breasts: gyn Heart: regular rate and rhythm, S1, S2 normal, no murmur, click, rub or gallop Abdomen:  soft, non-tender; bowel sounds normal; no masses,  no organomegaly Pelvic: deferred--gyn Extremities: extremities normal, atraumatic, no cyanosis or edema Pulses: 2+ and symmetric Skin: Skin color, texture, turgor normal. No rashes or lesions Lymph nodes: Cervical, supraclavicular, and axillary nodes normal. Neurologic: Alert and oriented X 3, normal strength and tone. Normal symmetric reflexes. Normal coordination and gait Psych- mild depression since retiring      Assessment:     cpe  .   Plan:     During the course of the visit the patient was educated and counseled about appropriate screening and preventive services including:    Pneumococcal vaccine   Influenza vaccine  Screening mammography  Screening Pap smear and pelvic exam   Bone densitometry screening  Colorectal cancer screening  Diabetes screening  Glaucoma screening  Advanced directives: has NO advanced directive  - add't info requested. Referral to SW: no  Diet review for nutrition referral? Yes ____  Not Indicated __x__   Patient Instructions (the written plan) was given to the patient.  Medicare Attestation I have personally reviewed: The patient's medical and social history Their use of alcohol, tobacco or illicit drugs Their current medications and  supplements The patient's functional ability including ADLs,fall risks, home safety risks, cognitive, and hearing and visual impairment Diet and physical activities Evidence for depression or mood disorders  The patient's weight, height, BMI, and visual acuity have been recorded in the chart.  I have made referrals, counseling, and provided education to the patient based on review of the above and I have provided the patient with a written personalized care plan for preventive services.   1. Need for prophylactic vaccination and inoculation against influenza   - Flu Vaccine QUAD 36+ mos PF IM (Fluarix Quad PF)  2. Need for pneumococcal vaccine   - Pneumococcal conjugate vaccine 13-valent  3. Preventative health care   - Basic metabolic panel - CBC with Differential - Hepatic function panel - Lipid panel - POCT urinalysis dipstick - TSH - Ambulatory referral to Gastroenterology  4. Hyperlipidemia Check labs - Basic metabolic panel - CBC with Differential - Hepatic function panel - Lipid panel - POCT urinalysis dipstick - TSH  5. Essential hypertension stable - Basic metabolic panel - CBC with Differential - Hepatic function panel - Lipid panel - POCT urinalysis dipstick - TSH   Garnet Koyanagi, DO   10/13/2014

## 2014-10-13 NOTE — Progress Notes (Signed)
Pre visit review using our clinic review tool, if applicable. No additional management support is needed unless otherwise documented below in the visit note. 

## 2014-10-14 LAB — CBC WITH DIFFERENTIAL/PLATELET
BASOS PCT: 0 % (ref 0–1)
Basophils Absolute: 0 10*3/uL (ref 0.0–0.1)
EOS ABS: 0.1 10*3/uL (ref 0.0–0.7)
Eosinophils Relative: 2 % (ref 0–5)
HCT: 33.9 % — ABNORMAL LOW (ref 36.0–46.0)
Hemoglobin: 11.5 g/dL — ABNORMAL LOW (ref 12.0–15.0)
Lymphocytes Relative: 29 % (ref 12–46)
Lymphs Abs: 1.3 10*3/uL (ref 0.7–4.0)
MCH: 27.8 pg (ref 26.0–34.0)
MCHC: 33.9 g/dL (ref 30.0–36.0)
MCV: 81.9 fL (ref 78.0–100.0)
MONO ABS: 0.5 10*3/uL (ref 0.1–1.0)
MPV: 9.8 fL (ref 9.4–12.4)
Monocytes Relative: 10 % (ref 3–12)
Neutro Abs: 2.7 10*3/uL (ref 1.7–7.7)
Neutrophils Relative %: 59 % (ref 43–77)
Platelets: 262 10*3/uL (ref 150–400)
RBC: 4.14 MIL/uL (ref 3.87–5.11)
RDW: 14.2 % (ref 11.5–15.5)
WBC: 4.5 10*3/uL (ref 4.0–10.5)

## 2014-10-14 LAB — BASIC METABOLIC PANEL
BUN: 12 mg/dL (ref 6–23)
CO2: 25 mEq/L (ref 19–32)
CREATININE: 0.7 mg/dL (ref 0.50–1.10)
Calcium: 9 mg/dL (ref 8.4–10.5)
Chloride: 101 mEq/L (ref 96–112)
GLUCOSE: 85 mg/dL (ref 70–99)
POTASSIUM: 3.6 meq/L (ref 3.5–5.3)
Sodium: 138 mEq/L (ref 135–145)

## 2014-10-14 LAB — LIPID PANEL
Cholesterol: 163 mg/dL (ref 0–200)
HDL: 63 mg/dL (ref >39)
LDL CALC: 74 mg/dL (ref 0–99)
Total CHOL/HDL Ratio: 2.6 Ratio
Triglycerides: 132 mg/dL (ref <150)
VLDL: 26 mg/dL (ref 0–40)

## 2014-10-14 LAB — HEPATIC FUNCTION PANEL
ALBUMIN: 4.1 g/dL (ref 3.5–5.2)
ALT: 23 U/L (ref 0–35)
AST: 23 U/L (ref 0–37)
Alkaline Phosphatase: 70 U/L (ref 39–117)
Bilirubin, Direct: 0.1 mg/dL (ref 0.0–0.3)
Indirect Bilirubin: 0.5 mg/dL (ref 0.2–1.2)
Total Bilirubin: 0.6 mg/dL (ref 0.2–1.2)
Total Protein: 6.6 g/dL (ref 6.0–8.3)

## 2014-10-14 LAB — TSH: TSH: 1.421 u[IU]/mL (ref 0.350–4.500)

## 2014-10-30 ENCOUNTER — Other Ambulatory Visit: Payer: Self-pay | Admitting: Family Medicine

## 2014-10-31 ENCOUNTER — Other Ambulatory Visit: Payer: Self-pay | Admitting: *Deleted

## 2014-10-31 MED ORDER — VALSARTAN-HYDROCHLOROTHIAZIDE 80-12.5 MG PO TABS: 1.0000 | ORAL_TABLET | Freq: Every day | ORAL | Status: DC

## 2014-11-08 ENCOUNTER — Other Ambulatory Visit: Payer: Self-pay | Admitting: Family Medicine

## 2014-11-22 ENCOUNTER — Telehealth: Payer: Self-pay | Admitting: Family Medicine

## 2014-11-22 NOTE — Telephone Encounter (Signed)
Caller name:Janvier, Hassan Rowan Relation to AK:LTYV Call back number:[phone] Pharmacy:[facility]  Reason for call: pt is needing rx  omeprazole (PRILOSEC) 20 MG capsule pt would like to know if she can get approved for a 90 day supply.

## 2014-11-23 MED ORDER — OMEPRAZOLE 20 MG PO CPDR: DELAYED_RELEASE_CAPSULE | ORAL | Status: DC

## 2014-11-23 NOTE — Telephone Encounter (Signed)
Rx faxed #[phone] with 3 rf.       KP

## 2015-02-07 ENCOUNTER — Other Ambulatory Visit: Payer: Self-pay | Admitting: Family Medicine

## 2015-03-13 ENCOUNTER — Other Ambulatory Visit: Payer: Self-pay

## 2015-03-13 MED ORDER — FLUTICASONE PROPIONATE 50 MCG/ACT NA SUSP: 2.0000 | Freq: Every day | NASAL | Status: DC

## 2015-03-13 NOTE — Telephone Encounter (Signed)
90 day supply requested for flonase. Rx re-sent.      KP

## 2015-04-27 ENCOUNTER — Other Ambulatory Visit: Payer: Self-pay | Admitting: Family Medicine

## 2015-05-10 ENCOUNTER — Other Ambulatory Visit: Payer: Self-pay | Admitting: Family Medicine

## 2015-06-11 ENCOUNTER — Other Ambulatory Visit: Payer: Self-pay | Admitting: Family Medicine

## 2015-06-11 NOTE — Telephone Encounter (Signed)
Pt was due for 6 month follow up in June and is past due.  30 day supply of bupropion sent to pharmacy.  Pt needs follow up before further refills are given.  Please call pt to arrange appt. Thanks!

## 2015-06-15 NOTE — Telephone Encounter (Signed)
LVM advising patient to schedule appointment.

## 2015-07-10 ENCOUNTER — Other Ambulatory Visit: Payer: Self-pay | Admitting: Family Medicine

## 2015-07-10 NOTE — Telephone Encounter (Signed)
Please schedule this patient an apt and forward back to me.      KP

## 2015-07-12 NOTE — Telephone Encounter (Signed)
[facility] from [facility] to f/u on refill request. Advised that pt needs appt. LM on pt mach to call and schedule.

## 2015-07-28 ENCOUNTER — Other Ambulatory Visit: Payer: Self-pay | Admitting: Family Medicine

## 2015-07-30 ENCOUNTER — Encounter: Payer: Self-pay | Admitting: Family Medicine

## 2015-07-30 ENCOUNTER — Ambulatory Visit (INDEPENDENT_AMBULATORY_CARE_PROVIDER_SITE_OTHER): Payer: Federal, State, Local not specified - PPO | Admitting: Family Medicine

## 2015-07-30 VITALS — BP 143/64 | HR 82 | Temp 98.3°F | Ht 66.0 in | Wt 205.4 lb

## 2015-07-30 DIAGNOSIS — Z1239 Encounter for other screening for malignant neoplasm of breast: Secondary | ICD-10-CM

## 2015-07-30 DIAGNOSIS — Z23 Encounter for immunization: Secondary | ICD-10-CM | POA: Diagnosis not present

## 2015-07-30 DIAGNOSIS — D649 Anemia, unspecified: Secondary | ICD-10-CM | POA: Diagnosis not present

## 2015-07-30 DIAGNOSIS — I10 Essential (primary) hypertension: Secondary | ICD-10-CM | POA: Diagnosis not present

## 2015-07-30 DIAGNOSIS — Z0000 Encounter for general adult medical examination without abnormal findings: Secondary | ICD-10-CM

## 2015-07-30 DIAGNOSIS — Z780 Asymptomatic menopausal state: Secondary | ICD-10-CM | POA: Diagnosis not present

## 2015-07-30 DIAGNOSIS — E785 Hyperlipidemia, unspecified: Secondary | ICD-10-CM

## 2015-07-30 NOTE — Progress Notes (Signed)
Patient ID: BERNARD DONAHOO, female    DOB: 1946-03-22  Age: 69 y.o. MRN: [id number]    Subjective:  Subjective HPI Robin Wall presents for f/u cholesterol and bp.  No complaints.   Review of Systems  Constitutional: Negative for diaphoresis, appetite change, fatigue and unexpected weight change.  Eyes: Negative for pain, redness and visual disturbance.  Respiratory: Negative for cough, chest tightness, shortness of breath and wheezing.   Cardiovascular: Negative for chest pain, palpitations and leg swelling.  Endocrine: Negative for cold intolerance, heat intolerance, polydipsia, polyphagia and polyuria.  Genitourinary: Negative for dysuria, frequency and difficulty urinating.  Neurological: Negative for dizziness, light-headedness, numbness and headaches.    History Past Medical History  Diagnosis Date  . Hypertension   . Hyperlipidemia   . Allergy   . GERD (gastroesophageal reflux disease)     She has past surgical history that includes Cataract extraction.   Her family history includes Breast cancer (age of onset: 1) in her paternal grandmother; Coronary artery disease in an other family member; Diabetes in her father; Heart disease in her father; Hyperlipidemia in her father and mother; Hypertension in her father and mother.She reports that she has never smoked. She has never used smokeless tobacco. She reports that she does not drink alcohol or use illicit drugs.  Current Outpatient Prescriptions on File Prior to Visit  Medication Sig Dispense Refill  . aspirin 81 MG tablet Take 81 mg by mouth daily.    Marland Kitchen buPROPion (WELLBUTRIN XL) 300 MG 24 hr tablet TAKE 1 TABLET (300 MG TOTAL) BY MOUTH EVERY MORNING. OFFICE VISIT DUE NOW 30 tablet 0  . fluticasone (FLONASE) 50 MCG/ACT nasal spray Place 2 sprays into both nostrils daily. 48 g 1  . levocetirizine (XYZAL) 5 MG tablet TAKE 1 TABLET BY MOUTH EVERY DAY 90 tablet 3  . Multiple Vitamin (MULTIVITAMIN) tablet Take 1 tablet by  mouth daily.    Marland Kitchen omeprazole (PRILOSEC) 20 MG capsule TAKE 1 CAPSULE (20 MG TOTAL) BY MOUTH DAILY. 90 capsule 3  . simvastatin (ZOCOR) 40 MG tablet TAKE 1 TABLET BY MOUTH EVERY DAY AT BEDTIME 90 tablet 0  . SUMAtriptan (IMITREX) 100 MG tablet Take 1 tablet (100 mg total) by mouth every 2 (two) hours as needed. 10 tablet 2  . valsartan-hydrochlorothiazide (DIOVAN-HCT) 80-12.5 MG per tablet TAKE 1 TABLET BY MOUTH DAILY. OFFICE VISIT DUE NOW 90 tablet 0   No current facility-administered medications on file prior to visit.     Objective:  Objective Physical Exam  Constitutional: She is oriented to person, place, and time. She appears well-developed and well-nourished.  HENT:  Head: Normocephalic and atraumatic.  Eyes: Conjunctivae and EOM are normal.  Neck: Normal range of motion. Neck supple. No JVD present. Carotid bruit is not present. No thyromegaly present.  Cardiovascular: Normal rate, regular rhythm and normal heart sounds.   No murmur heard. Pulmonary/Chest: Effort normal and breath sounds normal. No respiratory distress. She has no wheezes. She has no rales. She exhibits no tenderness.  Musculoskeletal: She exhibits no edema.  Neurological: She is alert and oriented to person, place, and time.  Psychiatric: She has a normal mood and affect. Her behavior is normal.   BP 143/64 mmHg  Pulse 82  Temp(Src) 98.3 F (36.8 C) (Oral)  Ht 5\' 6"  (1.676 m)  Wt 205 lb 6.4 oz (93.169 kg)  BMI 33.17 kg/m2  SpO2 97%  LMP  (LMP Unknown) Wt Readings from Last 3 Encounters:  07/30/15 205 lb  6.4 oz (93.169 kg)  10/13/14 204 lb (92.534 kg)  02/15/14 198 lb 3.2 oz (89.903 kg)     Lab Results  Component Value Date   WBC 4.5 10/13/2014   HGB 11.5* 10/13/2014   HCT 33.9* 10/13/2014   PLT 262 10/13/2014   GLUCOSE 85 10/13/2014   CHOL 163 10/13/2014   TRIG 132 10/13/2014   HDL 63 10/13/2014   LDLDIRECT 134.6 05/31/2007   LDLCALC 74 10/13/2014   ALT 23 10/13/2014   AST 23 10/13/2014    NA 138 10/13/2014   K 3.6 10/13/2014   CL 101 10/13/2014   CREATININE 0.70 10/13/2014   BUN 12 10/13/2014   CO2 25 10/13/2014   TSH 1.421 10/13/2014    Mm Screening Breast Tomo Bilateral  08/10/2014   CLINICAL DATA:  Screening.  EXAM: DIGITAL SCREENING BILATERAL MAMMOGRAM WITH 3D TOMO WITH CAD  COMPARISON:  Previous exam(s).  ACR Breast Density Category b: There are scattered areas of fibroglandular density.  FINDINGS: There has been no significant interval change and there are no findings suspicious for malignancy. Images were processed with CAD.  IMPRESSION: No mammographic evidence of malignancy. A result letter of this screening mammogram will be mailed directly to the patient.  RECOMMENDATION: Screening mammogram in one year. (Code:SM-B-01Y)  BI-RADS CATEGORY  1: Negative.   Electronically Signed   By: Andres Shad   On: 08/10/2014 11:06     Assessment & Plan:  Plan I am having Robin Wall maintain her SUMAtriptan, aspirin, multivitamin, levocetirizine, omeprazole, fluticasone, buPROPion, simvastatin, and valsartan-hydrochlorothiazide.  No orders of the defined types were placed in this encounter.    Problem List Items Addressed This Visit    Hyperlipidemia   Relevant Orders   Lipid panel   Essential hypertension - Primary   Relevant Orders   Comprehensive metabolic panel   CBC with Differential/Platelet    Other Visit Diagnoses    Anemia, unspecified anemia type        Preventative health care        Relevant Orders    Hepatitis C antibody    Postmenopausal estrogen deficiency        Relevant Orders    DG Bone Density    Breast cancer screening        Relevant Orders    MM DIGITAL SCREENING BILATERAL    Need for prophylactic vaccination and inoculation against influenza        Relevant Orders    Flu vaccine HIGH DOSE PF (Fluzone High dose) (Completed)       Follow-up: Return in about 3 months (around 10/29/2015) for Wellness.  Garnet Koyanagi, DO

## 2015-07-30 NOTE — Patient Instructions (Signed)

## 2015-07-30 NOTE — Progress Notes (Signed)
Pre visit review using our clinic review tool, if applicable. No additional management support is needed unless otherwise documented below in the visit note. 

## 2015-07-31 LAB — COMPREHENSIVE METABOLIC PANEL
ALT: 20 U/L (ref 0–35)
AST: 18 U/L (ref 0–37)
Albumin: 4.4 g/dL (ref 3.5–5.2)
Alkaline Phosphatase: 77 U/L (ref 39–117)
BUN: 17 mg/dL (ref 6–23)
CO2: 31 meq/L (ref 19–32)
Calcium: 9.6 mg/dL (ref 8.4–10.5)
Chloride: 102 mEq/L (ref 96–112)
Creatinine, Ser: 0.73 mg/dL (ref 0.40–1.20)
GFR: 84 mL/min (ref >60.00)
Glucose, Bld: 82 mg/dL (ref 70–99)
Potassium: 4 mEq/L (ref 3.5–5.1)
SODIUM: 140 meq/L (ref 135–145)
Total Bilirubin: 0.6 mg/dL (ref 0.2–1.2)
Total Protein: 7 g/dL (ref 6.0–8.3)

## 2015-07-31 LAB — HEPATITIS C ANTIBODY: HCV Ab: NEGATIVE

## 2015-07-31 LAB — CBC WITH DIFFERENTIAL/PLATELET
Basophils Absolute: 0 10*3/uL (ref 0.0–0.1)
Basophils Relative: 0.5 % (ref 0.0–3.0)
Eosinophils Absolute: 0.2 10*3/uL (ref 0.0–0.7)
Eosinophils Relative: 3.6 % (ref 0.0–5.0)
HCT: 37 % (ref 36.0–46.0)
Hemoglobin: 12.4 g/dL (ref 12.0–15.0)
LYMPHS PCT: 24.8 % (ref 12.0–46.0)
Lymphs Abs: 1.1 10*3/uL (ref 0.7–4.0)
MCHC: 33.5 g/dL (ref 30.0–36.0)
MCV: 85.1 fl (ref 78.0–100.0)
MONOS PCT: 8.3 % (ref 3.0–12.0)
Monocytes Absolute: 0.4 10*3/uL (ref 0.1–1.0)
Neutro Abs: 2.9 10*3/uL (ref 1.4–7.7)
Neutrophils Relative %: 62.8 % (ref 43.0–77.0)
PLATELETS: 227 10*3/uL (ref 150.0–400.0)
RBC: 4.35 Mil/uL (ref 3.87–5.11)
RDW: 14.3 % (ref 11.5–15.5)
WBC: 4.6 10*3/uL (ref 4.0–10.5)

## 2015-07-31 LAB — LIPID PANEL
Cholesterol: 185 mg/dL (ref 0–200)
HDL: 63.9 mg/dL (ref >39.00)
LDL Cholesterol: 94 mg/dL (ref 0–99)
NONHDL: 120.6
Total CHOL/HDL Ratio: 3
Triglycerides: 131 mg/dL (ref 0.0–149.0)
VLDL: 26.2 mg/dL (ref 0.0–40.0)

## 2015-08-15 ENCOUNTER — Other Ambulatory Visit: Payer: Self-pay | Admitting: Family Medicine

## 2015-08-15 DIAGNOSIS — Z1231 Encounter for screening mammogram for malignant neoplasm of breast: Secondary | ICD-10-CM

## 2015-08-19 ENCOUNTER — Other Ambulatory Visit: Payer: Self-pay | Admitting: Family Medicine

## 2015-09-13 ENCOUNTER — Ambulatory Visit: Payer: Federal, State, Local not specified - PPO

## 2015-09-13 ENCOUNTER — Other Ambulatory Visit: Payer: Federal, State, Local not specified - PPO

## 2015-10-15 ENCOUNTER — Ambulatory Visit: Payer: Federal, State, Local not specified - PPO

## 2015-10-15 ENCOUNTER — Other Ambulatory Visit: Payer: Federal, State, Local not specified - PPO

## 2015-10-22 ENCOUNTER — Ambulatory Visit
Admission: RE | Admit: 2015-10-22 | Discharge: 2015-10-22 | Disposition: A | Discharge status: Home / Self Care | Payer: Federal, State, Local not specified - PPO | Source: Ambulatory Visit | Attending: Family Medicine | Admitting: Family Medicine

## 2015-10-22 DIAGNOSIS — Z1231 Encounter for screening mammogram for malignant neoplasm of breast: Secondary | ICD-10-CM

## 2015-10-22 DIAGNOSIS — Z780 Asymptomatic menopausal state: Secondary | ICD-10-CM

## 2015-10-24 ENCOUNTER — Other Ambulatory Visit: Payer: Self-pay | Admitting: Family Medicine

## 2015-10-30 ENCOUNTER — Other Ambulatory Visit: Payer: Self-pay | Admitting: Family Medicine

## 2015-11-08 ENCOUNTER — Encounter: Payer: Self-pay | Admitting: Family Medicine

## 2015-11-08 ENCOUNTER — Ambulatory Visit (INDEPENDENT_AMBULATORY_CARE_PROVIDER_SITE_OTHER): Payer: Federal, State, Local not specified - PPO | Admitting: Family Medicine

## 2015-11-08 VITALS — BP 136/78 | HR 81 | Temp 98.3°F | Ht 66.0 in | Wt 203.2 lb

## 2015-11-08 DIAGNOSIS — I10 Essential (primary) hypertension: Secondary | ICD-10-CM

## 2015-11-08 DIAGNOSIS — E785 Hyperlipidemia, unspecified: Secondary | ICD-10-CM | POA: Diagnosis not present

## 2015-11-08 DIAGNOSIS — J0110 Acute frontal sinusitis, unspecified: Secondary | ICD-10-CM

## 2015-11-08 DIAGNOSIS — Z0000 Encounter for general adult medical examination without abnormal findings: Secondary | ICD-10-CM | POA: Diagnosis not present

## 2015-11-08 MED ORDER — SIMVASTATIN 40 MG PO TABS: 40.0000 mg | ORAL_TABLET | Freq: Every day | ORAL | Status: DC

## 2015-11-08 MED ORDER — AMOXICILLIN-POT CLAVULANATE 875-125 MG PO TABS: 1.0000 | ORAL_TABLET | Freq: Two times a day (BID) | ORAL | Status: DC

## 2015-11-08 MED ORDER — VALSARTAN-HYDROCHLOROTHIAZIDE 80-12.5 MG PO TABS: 1.0000 | ORAL_TABLET | Freq: Every day | ORAL | Status: DC

## 2015-11-08 NOTE — Progress Notes (Signed)
Subjective:     Robin Wall is a 70 y.o. female and is here for a comprehensive physical exam. The patient reports problem with sinus congestion x 1 month. She has been using her zyrtec and flonase with no relief.  No fevers.  + green mucus.    Social History   Social History  . Marital Status: Married    Spouse Name: N/A  . Number of Children: N/A  . Years of Education: N/A   Occupational History  . [employer]     retired July 2015   Social History Main Topics  . Smoking status: Never Smoker   . Smokeless tobacco: Never Used  . Alcohol Use: No  . Drug Use: No  . Sexual Activity:    Partners: Male   Other Topics Concern  . Not on file   Social History Narrative   Exercising---walking         Health Maintenance  Topic Date Due  . COLONOSCOPY  11/05/2013  . INFLUENZA VACCINE  06/03/2016  . MAMMOGRAM  10/21/2017  . TETANUS/TDAP  04/22/2021  . DEXA SCAN  Completed  . ZOSTAVAX  Completed  . Hepatitis C Screening  Completed  . PNA vac Low Risk Adult  Completed    The following portions of the patient's history were reviewed and updated as appropriate:  She  has a past medical history of Hypertension; Hyperlipidemia; Allergy; and GERD (gastroesophageal reflux disease). She  does not have any pertinent problems on file. She  has past surgical history that includes Cataract extraction. Her family history includes Breast cancer (age of onset: 74) in her paternal grandmother; Diabetes in her father; Heart disease in her father; Hyperlipidemia in her father and mother; Hypertension in her father and mother. She  reports that she has never smoked. She has never used smokeless tobacco. She reports that she does not drink alcohol or use illicit drugs. She has a current medication list which includes the following prescription(s): aspirin, bupropion, fluticasone, levocetirizine, multivitamin, omeprazole, simvastatin, sumatriptan, valsartan-hydrochlorothiazide, and  amoxicillin-clavulanate. Current Outpatient Prescriptions on File Prior to Visit  Medication Sig Dispense Refill  . aspirin 81 MG tablet Take 81 mg by mouth daily.    Marland Kitchen buPROPion (WELLBUTRIN XL) 300 MG 24 hr tablet Take 1 tablet (300 mg total) by mouth daily. 30 tablet 5  . fluticasone (FLONASE) 50 MCG/ACT nasal spray Place 2 sprays into both nostrils daily. 48 g 1  . levocetirizine (XYZAL) 5 MG tablet TAKE 1 TABLET BY MOUTH EVERY DAY 90 tablet 3  . Multiple Vitamin (MULTIVITAMIN) tablet Take 1 tablet by mouth daily.    Marland Kitchen omeprazole (PRILOSEC) 20 MG capsule TAKE 1 CAPSULE (20 MG TOTAL) BY MOUTH DAILY. 90 capsule 3  . SUMAtriptan (IMITREX) 100 MG tablet Take 1 tablet (100 mg total) by mouth every 2 (two) hours as needed. 10 tablet 2   No current facility-administered medications on file prior to visit.   She has No Known Allergies..  Review of Systems  Review of Systems  Constitutional: Negative for activity change, appetite change and fatigue.  HENT: Negative for hearing loss, congestion, tinnitus and ear discharge.   Eyes: Negative for visual disturbance (see optho q1y -- vision corrected to 20/20 with glasses).  Respiratory: Negative for cough, chest tightness and shortness of breath.   Cardiovascular: Negative for chest pain, palpitations and leg swelling.  Gastrointestinal: Negative for abdominal pain, diarrhea, constipation and abdominal distention.  Genitourinary: Negative for urgency, frequency, decreased urine volume and difficulty urinating.  Musculoskeletal: Negative for back pain, arthralgias and gait problem.  Skin: Negative for color change, pallor and rash.  Neurological: Negative for dizziness, light-headedness, numbness and headaches.  Hematological: Negative for adenopathy. Does not bruise/bleed easily.  Psychiatric/Behavioral: Negative for suicidal ideas, confusion, sleep disturbance, self-injury, dysphoric mood, decreased concentration and agitation.  Pt is able to  read and write and can do all ADLs No risk for falling No abuse/ violence in home   Objective:    BP 136/78 mmHg  Pulse 81  Temp(Src) 98.3 F (36.8 C) (Oral)  Ht 5' 6"  (1.676 m)  Wt 203 lb 3.2 oz (92.171 kg)  BMI 32.81 kg/m2  SpO2 98%  LMP  (LMP Unknown) General appearance: alert, cooperative, appears stated age and no distress Head: Normocephalic, without obvious abnormality, atraumatic Eyes: conjunctivae/corneas clear. PERRL, EOM's intact. Fundi benign. Ears: normal TM's and external ear canals both ears Nose: green discharge, mild congestion, turbinates red, swollen, sinus tenderness bilateral Throat: abnormal findings: mild oropharyngeal erythema Neck: mild anterior cervical adenopathy, supple, symmetrical, trachea midline and thyroid not enlarged, symmetric, no tenderness/mass/nodules Back: symmetric, no curvature. ROM normal. No CVA tenderness. Lungs: clear to auscultation bilaterally Breasts: gyn Heart: regular rate and rhythm, S1, S2 normal, no murmur, click, rub or gallop Abdomen: soft, non-tender; bowel sounds normal; no masses,  no organomegaly Pelvic: deferred --gyn Extremities: extremities normal, atraumatic, no cyanosis or edema Pulses: 2+ and symmetric Skin: Skin color, texture, turgor normal. No rashes or lesions Lymph nodes: Cervical, supraclavicular, and axillary nodes normal. Neurologic: Alert and oriented X 3, normal strength and tone. Normal symmetric reflexes. Normal coordination and gait Psych-- no depression, no anxiety      Assessment:    Healthy female exam.     Plan:     See After Visit Summary for Counseling Recommendations    1. Preventative health care See avs - Ambulatory referral to Gastroenterology - TSH  2. Essential hypertension con't diovan - Comp Met (CMET) - CBC with Differential/Platelet - POCT urinalysis dipstick - TSH  3. Hyperlipidemia con't zocor - Comp Met (CMET) - Lipid panel - POCT urinalysis dipstick -  TSH  4. Acute frontal sinusitis, recurrence not specified   - amoxicillin-clavulanate (AUGMENTIN) 875-125 MG tablet; Take 1 tablet by mouth 2 (two) times daily.  Dispense: 20 tablet; Refill: 0

## 2015-11-08 NOTE — Patient Instructions (Signed)
Preventive Care for Adults, Female A healthy lifestyle and preventive care can promote health and wellness. Preventive health guidelines for women include the following key practices.  A routine yearly physical is a good way to check with your health care provider about your health and preventive screening. It is a chance to share any concerns and updates on your health and to receive a thorough exam.  Visit your dentist for a routine exam and preventive care every 6 months. Brush your teeth twice a day and floss once a day. Good oral hygiene prevents tooth decay and gum disease.  The frequency of eye exams is based on your age, health, family medical history, use of contact lenses, and other factors. Follow your health care provider's recommendations for frequency of eye exams.  Eat a healthy diet. Foods like vegetables, fruits, whole grains, low-fat dairy products, and lean protein foods contain the nutrients you need without too many calories. Decrease your intake of foods high in solid fats, added sugars, and salt. Eat the right amount of calories for you.Get information about a proper diet from your health care provider, if necessary.  Regular physical exercise is one of the most important things you can do for your health. Most adults should get at least 150 minutes of moderate-intensity exercise (any activity that increases your heart rate and causes you to sweat) each week. In addition, most adults need muscle-strengthening exercises on 2 or more days a week.  Maintain a healthy weight. The body mass index (BMI) is a screening tool to identify possible weight problems. It provides an estimate of body fat based on height and weight. Your health care provider can find your BMI and can help you achieve or maintain a healthy weight.For adults 20 years and older:  A BMI below 18.5 is considered underweight.  A BMI of 18.5 to 24.9 is normal.  A BMI of 25 to 29.9 is considered overweight.  A  BMI of 30 and above is considered obese.  Maintain normal blood lipids and cholesterol levels by exercising and minimizing your intake of saturated fat. Eat a balanced diet with plenty of fruit and vegetables. Blood tests for lipids and cholesterol should begin at age 45 and be repeated every 5 years. If your lipid or cholesterol levels are high, you are over 50, or you are at high risk for heart disease, you may need your cholesterol levels checked more frequently.Ongoing high lipid and cholesterol levels should be treated with medicines if diet and exercise are not working.  If you smoke, find out from your health care provider how to quit. If you do not use tobacco, do not start.  Lung cancer screening is recommended for adults aged 45-80 years who are at high risk for developing lung cancer because of a history of smoking. A yearly low-dose CT scan of the lungs is recommended for people who have at least a 30-pack-year history of smoking and are a current smoker or have quit within the past 15 years. A pack year of smoking is smoking an average of 1 pack of cigarettes a day for 1 year (for example: 1 pack a day for 30 years or 2 packs a day for 15 years). Yearly screening should continue until the smoker has stopped smoking for at least 15 years. Yearly screening should be stopped for people who develop a health problem that would prevent them from having lung cancer treatment.  If you are pregnant, do not drink alcohol. If you are  breastfeeding, be very cautious about drinking alcohol. If you are not pregnant and choose to drink alcohol, do not have more than 1 drink per day. One drink is considered to be 12 ounces (355 mL) of beer, 5 ounces (148 mL) of wine, or 1.5 ounces (44 mL) of liquor.  Avoid use of street drugs. Do not share needles with anyone. Ask for help if you need support or instructions about stopping the use of drugs.  High blood pressure causes heart disease and increases the risk  of stroke. Your blood pressure should be checked at least every 1 to 2 years. Ongoing high blood pressure should be treated with medicines if weight loss and exercise do not work.  If you are 55-79 years old, ask your health care provider if you should take aspirin to prevent strokes.  Diabetes screening is done by taking a blood sample to check your blood glucose level after you have not eaten for a certain period of time (fasting). If you are not overweight and you do not have risk factors for diabetes, you should be screened once every 3 years starting at age 45. If you are overweight or obese and you are 40-70 years of age, you should be screened for diabetes every year as part of your cardiovascular risk assessment.  Breast cancer screening is essential preventive care for women. You should practice "breast self-awareness." This means understanding the normal appearance and feel of your breasts and may include breast self-examination. Any changes detected, no matter how small, should be reported to a health care provider. Women in their 20s and 30s should have a clinical breast exam (CBE) by a health care provider as part of a regular health exam every 1 to 3 years. After age 40, women should have a CBE every year. Starting at age 40, women should consider having a mammogram (breast X-ray test) every year. Women who have a family history of breast cancer should talk to their health care provider about genetic screening. Women at a high risk of breast cancer should talk to their health care providers about having an MRI and a mammogram every year.  Breast cancer gene (BRCA)-related cancer risk assessment is recommended for women who have family members with BRCA-related cancers. BRCA-related cancers include breast, ovarian, tubal, and peritoneal cancers. Having family members with these cancers may be associated with an increased risk for harmful changes (mutations) in the breast cancer genes BRCA1 and  BRCA2. Results of the assessment will determine the need for genetic counseling and BRCA1 and BRCA2 testing.  Your health care provider may recommend that you be screened regularly for cancer of the pelvic organs (ovaries, uterus, and vagina). This screening involves a pelvic examination, including checking for microscopic changes to the surface of your cervix (Pap test). You may be encouraged to have this screening done every 3 years, beginning at age 21.  For women ages 30-65, health care providers may recommend pelvic exams and Pap testing every 3 years, or they may recommend the Pap and pelvic exam, combined with testing for human papilloma virus (HPV), every 5 years. Some types of HPV increase your risk of cervical cancer. Testing for HPV may also be done on women of any age with unclear Pap test results.  Other health care providers may not recommend any screening for nonpregnant women who are considered low risk for pelvic cancer and who do not have symptoms. Ask your health care provider if a screening pelvic exam is right for   you.  If you have had past treatment for cervical cancer or a condition that could lead to cancer, you need Pap tests and screening for cancer for at least 20 years after your treatment. If Pap tests have been discontinued, your risk factors (such as having a new sexual partner) need to be reassessed to determine if screening should resume. Some women have medical problems that increase the chance of getting cervical cancer. In these cases, your health care provider may recommend more frequent screening and Pap tests.  Colorectal cancer can be detected and often prevented. Most routine colorectal cancer screening begins at the age of 50 years and continues through age 75 years. However, your health care provider may recommend screening at an earlier age if you have risk factors for colon cancer. On a yearly basis, your health care provider may provide home test kits to check  for hidden blood in the stool. Use of a small camera at the end of a tube, to directly examine the colon (sigmoidoscopy or colonoscopy), can detect the earliest forms of colorectal cancer. Talk to your health care provider about this at age 50, when routine screening begins. Direct exam of the colon should be repeated every 5-10 years through age 75 years, unless early forms of precancerous polyps or small growths are found.  People who are at an increased risk for hepatitis B should be screened for this virus. You are considered at high risk for hepatitis B if:  You were born in a country where hepatitis B occurs often. Talk with your health care provider about which countries are considered high risk.  Your parents were born in a high-risk country and you have not received a shot to protect against hepatitis B (hepatitis B vaccine).  You have HIV or AIDS.  You use needles to inject street drugs.  You live with, or have sex with, someone who has hepatitis B.  You get hemodialysis treatment.  You take certain medicines for conditions like cancer, organ transplantation, and autoimmune conditions.  Hepatitis C blood testing is recommended for all people born from 1945 through 1965 and any individual with known risks for hepatitis C.  Practice safe sex. Use condoms and avoid high-risk sexual practices to reduce the spread of sexually transmitted infections (STIs). STIs include gonorrhea, chlamydia, syphilis, trichomonas, herpes, HPV, and human immunodeficiency virus (HIV). Herpes, HIV, and HPV are viral illnesses that have no cure. They can result in disability, cancer, and death.  You should be screened for sexually transmitted illnesses (STIs) including gonorrhea and chlamydia if:  You are sexually active and are younger than 24 years.  You are older than 24 years and your health care provider tells you that you are at risk for this type of infection.  Your sexual activity has changed  since you were last screened and you are at an increased risk for chlamydia or gonorrhea. Ask your health care provider if you are at risk.  If you are at risk of being infected with HIV, it is recommended that you take a prescription medicine daily to prevent HIV infection. This is called preexposure prophylaxis (PrEP). You are considered at risk if:  You are sexually active and do not regularly use condoms or know the HIV status of your partner(s).  You take drugs by injection.  You are sexually active with a partner who has HIV.  Talk with your health care provider about whether you are at high risk of being infected with HIV. If   you choose to begin PrEP, you should first be tested for HIV. You should then be tested every 3 months for as long as you are taking PrEP.  Osteoporosis is a disease in which the bones lose minerals and strength with aging. This can result in serious bone fractures or breaks. The risk of osteoporosis can be identified using a bone density scan. Women ages 67 years and over and women at risk for fractures or osteoporosis should discuss screening with their health care providers. Ask your health care provider whether you should take a calcium supplement or vitamin D to reduce the rate of osteoporosis.  Menopause can be associated with physical symptoms and risks. Hormone replacement therapy is available to decrease symptoms and risks. You should talk to your health care provider about whether hormone replacement therapy is right for you.  Use sunscreen. Apply sunscreen liberally and repeatedly throughout the day. You should seek shade when your shadow is shorter than you. Protect yourself by wearing long sleeves, pants, a wide-brimmed hat, and sunglasses year round, whenever you are outdoors.  Once a month, do a whole body skin exam, using a mirror to look at the skin on your back. Tell your health care provider of new moles, moles that have irregular borders, moles that  are larger than a pencil eraser, or moles that have changed in shape or color.  Stay current with required vaccines (immunizations).  Influenza vaccine. All adults should be immunized every year.  Tetanus, diphtheria, and acellular pertussis (Td, Tdap) vaccine. Pregnant women should receive 1 dose of Tdap vaccine during each pregnancy. The dose should be obtained regardless of the length of time since the last dose. Immunization is preferred during the 27th-36th week of gestation. An adult who has not previously received Tdap or who does not know her vaccine status should receive 1 dose of Tdap. This initial dose should be followed by tetanus and diphtheria toxoids (Td) booster doses every 10 years. Adults with an unknown or incomplete history of completing a 3-dose immunization series with Td-containing vaccines should begin or complete a primary immunization series including a Tdap dose. Adults should receive a Td booster every 10 years.  Varicella vaccine. An adult without evidence of immunity to varicella should receive 2 doses or a second dose if she has previously received 1 dose. Pregnant females who do not have evidence of immunity should receive the first dose after pregnancy. This first dose should be obtained before leaving the health care facility. The second dose should be obtained 4-8 weeks after the first dose.  Human papillomavirus (HPV) vaccine. Females aged 13-26 years who have not received the vaccine previously should obtain the 3-dose series. The vaccine is not recommended for use in pregnant females. However, pregnancy testing is not needed before receiving a dose. If a female is found to be pregnant after receiving a dose, no treatment is needed. In that case, the remaining doses should be delayed until after the pregnancy. Immunization is recommended for any person with an immunocompromised condition through the age of 61 years if she did not get any or all doses earlier. During the  3-dose series, the second dose should be obtained 4-8 weeks after the first dose. The third dose should be obtained 24 weeks after the first dose and 16 weeks after the second dose.  Zoster vaccine. One dose is recommended for adults aged 30 years or older unless certain conditions are present.  Measles, mumps, and rubella (MMR) vaccine. Adults born  before 1957 generally are considered immune to measles and mumps. Adults born in 1957 or later should have 1 or more doses of MMR vaccine unless there is a contraindication to the vaccine or there is laboratory evidence of immunity to each of the three diseases. A routine second dose of MMR vaccine should be obtained at least 28 days after the first dose for students attending postsecondary schools, health care workers, or international travelers. People who received inactivated measles vaccine or an unknown type of measles vaccine during 1963-1967 should receive 2 doses of MMR vaccine. People who received inactivated mumps vaccine or an unknown type of mumps vaccine before 1979 and are at high risk for mumps infection should consider immunization with 2 doses of MMR vaccine. For females of childbearing age, rubella immunity should be determined. If there is no evidence of immunity, females who are not pregnant should be vaccinated. If there is no evidence of immunity, females who are pregnant should delay immunization until after pregnancy. Unvaccinated health care workers born before 1957 who lack laboratory evidence of measles, mumps, or rubella immunity or laboratory confirmation of disease should consider measles and mumps immunization with 2 doses of MMR vaccine or rubella immunization with 1 dose of MMR vaccine.  Pneumococcal 13-valent conjugate (PCV13) vaccine. When indicated, a person who is uncertain of his immunization history and has no record of immunization should receive the PCV13 vaccine. All adults 65 years of age and older should receive this  vaccine. An adult aged 19 years or older who has certain medical conditions and has not been previously immunized should receive 1 dose of PCV13 vaccine. This PCV13 should be followed with a dose of pneumococcal polysaccharide (PPSV23) vaccine. Adults who are at high risk for pneumococcal disease should obtain the PPSV23 vaccine at least 8 weeks after the dose of PCV13 vaccine. Adults older than 70 years of age who have normal immune system function should obtain the PPSV23 vaccine dose at least 1 year after the dose of PCV13 vaccine.  Pneumococcal polysaccharide (PPSV23) vaccine. When PCV13 is also indicated, PCV13 should be obtained first. All adults aged 65 years and older should be immunized. An adult younger than age 65 years who has certain medical conditions should be immunized. Any person who resides in a nursing home or long-term care facility should be immunized. An adult smoker should be immunized. People with an immunocompromised condition and certain other conditions should receive both PCV13 and PPSV23 vaccines. People with human immunodeficiency virus (HIV) infection should be immunized as soon as possible after diagnosis. Immunization during chemotherapy or radiation therapy should be avoided. Routine use of PPSV23 vaccine is not recommended for American Indians, [place] Natives, or people younger than 65 years unless there are medical conditions that require PPSV23 vaccine. When indicated, people who have unknown immunization and have no record of immunization should receive PPSV23 vaccine. One-time revaccination 5 years after the first dose of PPSV23 is recommended for people aged 19-64 years who have chronic kidney failure, nephrotic syndrome, asplenia, or immunocompromised conditions. People who received 1-2 doses of PPSV23 before age 65 years should receive another dose of PPSV23 vaccine at age 65 years or later if at least 5 years have passed since the previous dose. Doses of PPSV23 are not  needed for people immunized with PPSV23 at or after age 65 years.  Meningococcal vaccine. Adults with asplenia or persistent complement component deficiencies should receive 2 doses of quadrivalent meningococcal conjugate (MenACWY-D) vaccine. The doses should be obtained   at least 2 months apart. Microbiologists working with certain meningococcal bacteria, [place] recruits, people at risk during an outbreak, and people who travel to or live in countries with a high rate of meningitis should be immunized. A first-year college student up through age 34 years who is living in a residence hall should receive a dose if she did not receive a dose on or after her 16th birthday. Adults who have certain high-risk conditions should receive one or more doses of vaccine.  Hepatitis A vaccine. Adults who wish to be protected from this disease, have certain high-risk conditions, work with hepatitis A-infected animals, work in hepatitis A research labs, or travel to or work in countries with a high rate of hepatitis A should be immunized. Adults who were previously unvaccinated and who anticipate close contact with an international adoptee during the first 60 days after arrival in the [place] from a country with a high rate of hepatitis A should be immunized.  Hepatitis B vaccine. Adults who wish to be protected from this disease, have certain high-risk conditions, may be exposed to blood or other infectious body fluids, are household contacts or sex partners of hepatitis B positive people, are clients or workers in certain care facilities, or travel to or work in countries with a high rate of hepatitis B should be immunized.  Haemophilus influenzae type b (Hib) vaccine. A previously unvaccinated person with asplenia or sickle cell disease or having a scheduled splenectomy should receive 1 dose of Hib vaccine. Regardless of previous immunization, a recipient of a hematopoietic stem cell transplant should receive a  3-dose series 6-12 months after her successful transplant. Hib vaccine is not recommended for adults with HIV infection. Preventive Services / Frequency Ages 35 to 4 years  Blood pressure check.** / Every 3-5 years.  Lipid and cholesterol check.** / Every 5 years beginning at age 60.  Clinical breast exam.** / Every 3 years for women in their 71s and 10s.  BRCA-related cancer risk assessment.** / For women who have family members with a BRCA-related cancer (breast, ovarian, tubal, or peritoneal cancers).  Pap test.** / Every 2 years from ages 76 through 26. Every 3 years starting at age 61 through age 76 or 93 with a history of 3 consecutive normal Pap tests.  HPV screening.** / Every 3 years from ages 37 through ages 60 to 51 with a history of 3 consecutive normal Pap tests.  Hepatitis C blood test.** / For any individual with known risks for hepatitis C.  Skin self-exam. / Monthly.  Influenza vaccine. / Every year.  Tetanus, diphtheria, and acellular pertussis (Tdap, Td) vaccine.** / Consult your health care provider. Pregnant women should receive 1 dose of Tdap vaccine during each pregnancy. 1 dose of Td every 10 years.  Varicella vaccine.** / Consult your health care provider. Pregnant females who do not have evidence of immunity should receive the first dose after pregnancy.  HPV vaccine. / 3 doses over 6 months, if 93 and younger. The vaccine is not recommended for use in pregnant females. However, pregnancy testing is not needed before receiving a dose.  Measles, mumps, rubella (MMR) vaccine.** / You need at least 1 dose of MMR if you were born in 1957 or later. You may also need a 2nd dose. For females of childbearing age, rubella immunity should be determined. If there is no evidence of immunity, females who are not pregnant should be vaccinated. If there is no evidence of immunity, females who are  pregnant should delay immunization until after pregnancy.  Pneumococcal  13-valent conjugate (PCV13) vaccine.** / Consult your health care provider.  Pneumococcal polysaccharide (PPSV23) vaccine.** / 1 to 2 doses if you smoke cigarettes or if you have certain conditions.  Meningococcal vaccine.** / 1 dose if you are age 68 to 8 years and a Market researcher living in a residence hall, or have one of several medical conditions, you need to get vaccinated against meningococcal disease. You may also need additional booster doses.  Hepatitis A vaccine.** / Consult your health care provider.  Hepatitis B vaccine.** / Consult your health care provider.  Haemophilus influenzae type b (Hib) vaccine.** / Consult your health care provider. Ages 7 to 53 years  Blood pressure check.** / Every year.  Lipid and cholesterol check.** / Every 5 years beginning at age 25 years.  Lung cancer screening. / Every year if you are aged 11-80 years and have a 30-pack-year history of smoking and currently smoke or have quit within the past 15 years. Yearly screening is stopped once you have quit smoking for at least 15 years or develop a health problem that would prevent you from having lung cancer treatment.  Clinical breast exam.** / Every year after age 48 years.  BRCA-related cancer risk assessment.** / For women who have family members with a BRCA-related cancer (breast, ovarian, tubal, or peritoneal cancers).  Mammogram.** / Every year beginning at age 41 years and continuing for as long as you are in good health. Consult with your health care provider.  Pap test.** / Every 3 years starting at age 65 years through age 37 or 70 years with a history of 3 consecutive normal Pap tests.  HPV screening.** / Every 3 years from ages 72 years through ages 60 to 40 years with a history of 3 consecutive normal Pap tests.  Fecal occult blood test (FOBT) of stool. / Every year beginning at age 21 years and continuing until age 5 years. You may not need to do this test if you get  a colonoscopy every 10 years.  Flexible sigmoidoscopy or colonoscopy.** / Every 5 years for a flexible sigmoidoscopy or every 10 years for a colonoscopy beginning at age 35 years and continuing until age 48 years.  Hepatitis C blood test.** / For all people born from [birth year] through 1965 and any individual with known risks for hepatitis C.  Skin self-exam. / Monthly.  Influenza vaccine. / Every year.  Tetanus, diphtheria, and acellular pertussis (Tdap/Td) vaccine.** / Consult your health care provider. Pregnant women should receive 1 dose of Tdap vaccine during each pregnancy. 1 dose of Td every 10 years.  Varicella vaccine.** / Consult your health care provider. Pregnant females who do not have evidence of immunity should receive the first dose after pregnancy.  Zoster vaccine.** / 1 dose for adults aged 30 years or older.  Measles, mumps, rubella (MMR) vaccine.** / You need at least 1 dose of MMR if you were born in 1957 or later. You may also need a second dose. For females of childbearing age, rubella immunity should be determined. If there is no evidence of immunity, females who are not pregnant should be vaccinated. If there is no evidence of immunity, females who are pregnant should delay immunization until after pregnancy.  Pneumococcal 13-valent conjugate (PCV13) vaccine.** / Consult your health care provider.  Pneumococcal polysaccharide (PPSV23) vaccine.** / 1 to 2 doses if you smoke cigarettes or if you have certain conditions.  Meningococcal vaccine.** /  Consult your health care provider.  Hepatitis A vaccine.** / Consult your health care provider.  Hepatitis B vaccine.** / Consult your health care provider.  Haemophilus influenzae type b (Hib) vaccine.** / Consult your health care provider. Ages 64 years and over  Blood pressure check.** / Every year.  Lipid and cholesterol check.** / Every 5 years beginning at age 23 years.  Lung cancer screening. / Every year if you  are aged 16-80 years and have a 30-pack-year history of smoking and currently smoke or have quit within the past 15 years. Yearly screening is stopped once you have quit smoking for at least 15 years or develop a health problem that would prevent you from having lung cancer treatment.  Clinical breast exam.** / Every year after age 74 years.  BRCA-related cancer risk assessment.** / For women who have family members with a BRCA-related cancer (breast, ovarian, tubal, or peritoneal cancers).  Mammogram.** / Every year beginning at age 44 years and continuing for as long as you are in good health. Consult with your health care provider.  Pap test.** / Every 3 years starting at age 58 years through age 22 or 39 years with 3 consecutive normal Pap tests. Testing can be stopped between 65 and 70 years with 3 consecutive normal Pap tests and no abnormal Pap or HPV tests in the past 10 years.  HPV screening.** / Every 3 years from ages 64 years through ages 70 or 61 years with a history of 3 consecutive normal Pap tests. Testing can be stopped between 65 and 70 years with 3 consecutive normal Pap tests and no abnormal Pap or HPV tests in the past 10 years.  Fecal occult blood test (FOBT) of stool. / Every year beginning at age 40 years and continuing until age 27 years. You may not need to do this test if you get a colonoscopy every 10 years.  Flexible sigmoidoscopy or colonoscopy.** / Every 5 years for a flexible sigmoidoscopy or every 10 years for a colonoscopy beginning at age 7 years and continuing until age 32 years.  Hepatitis C blood test.** / For all people born from [birth year] through 1965 and any individual with known risks for hepatitis C.  Osteoporosis screening.** / A one-time screening for women ages 30 years and over and women at risk for fractures or osteoporosis.  Skin self-exam. / Monthly.  Influenza vaccine. / Every year.  Tetanus, diphtheria, and acellular pertussis (Tdap/Td)  vaccine.** / 1 dose of Td every 10 years.  Varicella vaccine.** / Consult your health care provider.  Zoster vaccine.** / 1 dose for adults aged 35 years or older.  Pneumococcal 13-valent conjugate (PCV13) vaccine.** / Consult your health care provider.  Pneumococcal polysaccharide (PPSV23) vaccine.** / 1 dose for all adults aged 46 years and older.  Meningococcal vaccine.** / Consult your health care provider.  Hepatitis A vaccine.** / Consult your health care provider.  Hepatitis B vaccine.** / Consult your health care provider.  Haemophilus influenzae type b (Hib) vaccine.** / Consult your health care provider. ** Family history and personal history of risk and conditions may change your health care provider's recommendations.   This information is not intended to replace advice given to you by your health care provider. Make sure you discuss any questions you have with your health care provider.   Document Released: 12/16/2001 Document Revised: 11/10/2014 Document Reviewed: 03/17/2011 Elsevier Interactive Patient Education [employer].

## 2015-11-08 NOTE — Progress Notes (Signed)
Pre visit review using our clinic review tool, if applicable. No additional management support is needed unless otherwise documented below in the visit note. 

## 2015-11-09 LAB — CBC WITH DIFFERENTIAL/PLATELET
BASOS ABS: 0 10*3/uL (ref 0.0–0.1)
Basophils Relative: 0.7 % (ref 0.0–3.0)
EOS ABS: 0.1 10*3/uL (ref 0.0–0.7)
Eosinophils Relative: 1.3 % (ref 0.0–5.0)
HEMATOCRIT: 38.8 % (ref 36.0–46.0)
Hemoglobin: 12.9 g/dL (ref 12.0–15.0)
LYMPHS PCT: 17.2 % (ref 12.0–46.0)
Lymphs Abs: 1.2 10*3/uL (ref 0.7–4.0)
MCHC: 33.3 g/dL (ref 30.0–36.0)
MCV: 84.7 fl (ref 78.0–100.0)
Monocytes Absolute: 0.4 10*3/uL (ref 0.1–1.0)
Monocytes Relative: 6.2 % (ref 3.0–12.0)
Neutro Abs: 5 10*3/uL (ref 1.4–7.7)
Neutrophils Relative %: 74.6 % (ref 43.0–77.0)
PLATELETS: 265 10*3/uL (ref 150.0–400.0)
RBC: 4.58 Mil/uL (ref 3.87–5.11)
RDW: 14.4 % (ref 11.5–15.5)
WBC: 6.8 10*3/uL (ref 4.0–10.5)

## 2015-11-09 LAB — LIPID PANEL
Cholesterol: 179 mg/dL (ref 0–200)
HDL: 66.3 mg/dL (ref >39.00)
NONHDL: 112.99
Total CHOL/HDL Ratio: 3
Triglycerides: 206 mg/dL — ABNORMAL HIGH (ref 0.0–149.0)
VLDL: 41.2 mg/dL — AB (ref 0.0–40.0)

## 2015-11-09 LAB — COMPREHENSIVE METABOLIC PANEL
ALBUMIN: 4.7 g/dL (ref 3.5–5.2)
ALK PHOS: 75 U/L (ref 39–117)
ALT: 22 U/L (ref 0–35)
AST: 20 U/L (ref 0–37)
BILIRUBIN TOTAL: 0.5 mg/dL (ref 0.2–1.2)
BUN: 13 mg/dL (ref 6–23)
CALCIUM: 10.1 mg/dL (ref 8.4–10.5)
CO2: 32 meq/L (ref 19–32)
CREATININE: 0.72 mg/dL (ref 0.40–1.20)
Chloride: 97 mEq/L (ref 96–112)
GFR: 85.28 mL/min (ref >60.00)
Glucose, Bld: 84 mg/dL (ref 70–99)
Potassium: 4.1 mEq/L (ref 3.5–5.1)
Sodium: 136 mEq/L (ref 135–145)
TOTAL PROTEIN: 7.2 g/dL (ref 6.0–8.3)

## 2015-11-09 LAB — TSH: TSH: 1.26 u[IU]/mL (ref 0.35–4.50)

## 2015-11-09 LAB — LDL CHOLESTEROL, DIRECT: LDL DIRECT: 77 mg/dL

## 2015-11-09 NOTE — Assessment & Plan Note (Signed)
con't zocor Check labs 

## 2015-11-12 ENCOUNTER — Other Ambulatory Visit: Payer: Self-pay | Admitting: Family Medicine

## 2015-12-19 ENCOUNTER — Other Ambulatory Visit: Payer: Self-pay | Admitting: Family Medicine

## 2016-01-25 ENCOUNTER — Other Ambulatory Visit: Payer: Self-pay | Admitting: Family Medicine

## 2016-04-06 ENCOUNTER — Other Ambulatory Visit: Payer: Self-pay | Admitting: Family Medicine

## 2016-04-24 ENCOUNTER — Other Ambulatory Visit: Payer: Self-pay | Admitting: Family Medicine

## 2016-05-07 ENCOUNTER — Encounter: Payer: Self-pay | Admitting: Gastroenterology

## 2016-05-20 ENCOUNTER — Other Ambulatory Visit: Payer: Self-pay | Admitting: Family Medicine

## 2016-06-02 ENCOUNTER — Emergency Department (HOSPITAL_COMMUNITY): Payer: Federal, State, Local not specified - PPO

## 2016-06-02 ENCOUNTER — Observation Stay (HOSPITAL_COMMUNITY)
Admission: EM | Admit: 2016-06-02 | Discharge: 2016-06-03 | Disposition: A | Discharge status: Home / Self Care | Payer: Federal, State, Local not specified - PPO | Attending: Internal Medicine | Admitting: Internal Medicine

## 2016-06-02 ENCOUNTER — Encounter (HOSPITAL_COMMUNITY): Payer: Self-pay | Admitting: Emergency Medicine

## 2016-06-02 DIAGNOSIS — G454 Transient global amnesia: Principal | ICD-10-CM | POA: Diagnosis present

## 2016-06-02 DIAGNOSIS — F419 Anxiety disorder, unspecified: Secondary | ICD-10-CM | POA: Insufficient documentation

## 2016-06-02 DIAGNOSIS — F32A Depression, unspecified: Secondary | ICD-10-CM | POA: Diagnosis present

## 2016-06-02 DIAGNOSIS — E785 Hyperlipidemia, unspecified: Secondary | ICD-10-CM | POA: Diagnosis present

## 2016-06-02 DIAGNOSIS — K219 Gastro-esophageal reflux disease without esophagitis: Secondary | ICD-10-CM | POA: Diagnosis present

## 2016-06-02 DIAGNOSIS — G9340 Encephalopathy, unspecified: Secondary | ICD-10-CM | POA: Diagnosis not present

## 2016-06-02 DIAGNOSIS — F329 Major depressive disorder, single episode, unspecified: Secondary | ICD-10-CM

## 2016-06-02 DIAGNOSIS — I10 Essential (primary) hypertension: Secondary | ICD-10-CM | POA: Diagnosis not present

## 2016-06-02 DIAGNOSIS — R262 Difficulty in walking, not elsewhere classified: Secondary | ICD-10-CM | POA: Insufficient documentation

## 2016-06-02 LAB — DIFFERENTIAL
Basophils Absolute: 0 10*3/uL (ref 0.0–0.1)
Basophils Relative: 0 %
Eosinophils Absolute: 0.2 10*3/uL (ref 0.0–0.7)
Eosinophils Relative: 3 %
Lymphocytes Relative: 17 %
Lymphs Abs: 1.1 10*3/uL (ref 0.7–4.0)
Monocytes Absolute: 0.5 10*3/uL (ref 0.1–1.0)
Monocytes Relative: 7 %
Neutro Abs: 4.7 10*3/uL (ref 1.7–7.7)
Neutrophils Relative %: 73 %

## 2016-06-02 LAB — RAPID URINE DRUG SCREEN, HOSP PERFORMED
Amphetamines: NOT DETECTED
BARBITURATES: NOT DETECTED
BENZODIAZEPINES: NOT DETECTED
COCAINE: NOT DETECTED
Opiates: NOT DETECTED
TETRAHYDROCANNABINOL: NOT DETECTED

## 2016-06-02 LAB — I-STAT CHEM 8, ED
BUN: 13 mg/dL (ref 6–20)
Calcium, Ion: 1.22 mmol/L (ref 1.12–1.23)
Chloride: 102 mmol/L (ref 101–111)
Creatinine, Ser: 0.7 mg/dL (ref 0.44–1.00)
Glucose, Bld: 105 mg/dL — ABNORMAL HIGH (ref 65–99)
HCT: 36 % (ref 36.0–46.0)
Hemoglobin: 12.2 g/dL (ref 12.0–15.0)
Potassium: 3.7 mmol/L (ref 3.5–5.1)
Sodium: 141 mmol/L (ref 135–145)
TCO2: 26 mmol/L (ref 0–100)

## 2016-06-02 LAB — PROTIME-INR
INR: 0.89
Prothrombin Time: 12 seconds (ref 11.4–15.2)

## 2016-06-02 LAB — URINALYSIS, ROUTINE W REFLEX MICROSCOPIC
Bilirubin Urine: NEGATIVE
GLUCOSE, UA: NEGATIVE mg/dL
KETONES UR: NEGATIVE mg/dL
Nitrite: NEGATIVE
PROTEIN: NEGATIVE mg/dL
Specific Gravity, Urine: 1.013 (ref 1.005–1.030)
pH: 6 (ref 5.0–8.0)

## 2016-06-02 LAB — COMPREHENSIVE METABOLIC PANEL
ALT: 24 U/L (ref 14–54)
AST: 27 U/L (ref 15–41)
Albumin: 4.3 g/dL (ref 3.5–5.0)
Alkaline Phosphatase: 71 U/L (ref 38–126)
Anion gap: 8 (ref 5–15)
BUN: 12 mg/dL (ref 6–20)
CO2: 26 mmol/L (ref 22–32)
Calcium: 9.5 mg/dL (ref 8.9–10.3)
Chloride: 104 mmol/L (ref 101–111)
Creatinine, Ser: 0.72 mg/dL (ref 0.44–1.00)
GFR calc Af Amer: >60 mL/min (ref >60)
GFR calc non Af Amer: >60 mL/min (ref >60)
Glucose, Bld: 103 mg/dL — ABNORMAL HIGH (ref 65–99)
Potassium: 3.7 mmol/L (ref 3.5–5.1)
Sodium: 138 mmol/L (ref 135–145)
Total Bilirubin: 0.3 mg/dL (ref 0.3–1.2)
Total Protein: 6.5 g/dL (ref 6.5–8.1)

## 2016-06-02 LAB — CBC
HCT: 36.8 % (ref 36.0–46.0)
Hemoglobin: 11.9 g/dL — ABNORMAL LOW (ref 12.0–15.0)
MCH: 27.7 pg (ref 26.0–34.0)
MCHC: 32.3 g/dL (ref 30.0–36.0)
MCV: 85.6 fL (ref 78.0–100.0)
Platelets: 223 10*3/uL (ref 150–400)
RBC: 4.3 MIL/uL (ref 3.87–5.11)
RDW: 13.8 % (ref 11.5–15.5)
WBC: 6.4 10*3/uL (ref 4.0–10.5)

## 2016-06-02 LAB — URINE MICROSCOPIC-ADD ON

## 2016-06-02 LAB — I-STAT TROPONIN, ED: Troponin i, poc: 0 ng/mL (ref 0.00–0.08)

## 2016-06-02 LAB — CBG MONITORING, ED: GLUCOSE-CAPILLARY: 133 mg/dL — AB (ref 65–99)

## 2016-06-02 LAB — APTT: aPTT: 32 seconds (ref 24–36)

## 2016-06-02 LAB — ETHANOL: Alcohol, Ethyl (B): <5 mg/dL (ref <5)

## 2016-06-02 MED ORDER — PROCHLORPERAZINE EDISYLATE 5 MG/ML IJ SOLN: 10.0000 mg | Freq: Four times a day (QID) | INTRAMUSCULAR | Status: DC | PRN

## 2016-06-02 MED ORDER — DIPHENHYDRAMINE HCL 50 MG/ML IJ SOLN: 12.5000 mg | Freq: Four times a day (QID) | INTRAMUSCULAR | Status: DC | PRN

## 2016-06-02 MED ORDER — SODIUM CHLORIDE 0.9 % IV SOLN
INTRAVENOUS | Status: DC
  Administered 2016-06-02: 1000 mL via INTRAVENOUS

## 2016-06-02 MED ORDER — DIPHENHYDRAMINE HCL 50 MG/ML IJ SOLN
12.5000 mg | Freq: Once | INTRAMUSCULAR | Status: AC
  Administered 2016-06-02: 12.5 mg via INTRAVENOUS
  Filled 2016-06-02: qty 1

## 2016-06-02 MED ORDER — BUPROPION HCL ER (XL) 150 MG PO TB24
300.0000 mg | ORAL_TABLET | Freq: Every day | ORAL | Status: DC
  Administered 2016-06-03: 300 mg via ORAL
  Filled 2016-06-02: qty 2

## 2016-06-02 MED ORDER — SIMVASTATIN 40 MG PO TABS
40.0000 mg | ORAL_TABLET | Freq: Every day | ORAL | Status: DC
  Administered 2016-06-03: 40 mg via ORAL
  Filled 2016-06-02 (×2): qty 1

## 2016-06-02 MED ORDER — PANTOPRAZOLE SODIUM 40 MG PO TBEC
40.0000 mg | DELAYED_RELEASE_TABLET | Freq: Every day | ORAL | Status: DC
  Administered 2016-06-03: 40 mg via ORAL
  Filled 2016-06-02: qty 1

## 2016-06-02 MED ORDER — ADULT MULTIVITAMIN W/MINERALS CH
1.0000 | ORAL_TABLET | Freq: Every day | ORAL | Status: DC
  Administered 2016-06-03: 1 via ORAL
  Filled 2016-06-02: qty 1

## 2016-06-02 MED ORDER — ENOXAPARIN SODIUM 40 MG/0.4ML ~~LOC~~ SOLN
40.0000 mg | SUBCUTANEOUS | Status: DC
  Administered 2016-06-03: 40 mg via SUBCUTANEOUS
  Filled 2016-06-02: qty 0.4

## 2016-06-02 MED ORDER — ACETAMINOPHEN 325 MG PO TABS
650.0000 mg | ORAL_TABLET | Freq: Four times a day (QID) | ORAL | Status: DC | PRN
  Administered 2016-06-03: 650 mg via ORAL
  Filled 2016-06-02: qty 2

## 2016-06-02 MED ORDER — ONDANSETRON HCL 4 MG/2ML IJ SOLN: 4.0000 mg | Freq: Four times a day (QID) | INTRAMUSCULAR | Status: DC | PRN

## 2016-06-02 MED ORDER — HYDROCHLOROTHIAZIDE 12.5 MG PO CAPS
12.5000 mg | ORAL_CAPSULE | Freq: Every day | ORAL | Status: DC
  Administered 2016-06-03: 12.5 mg via ORAL
  Filled 2016-06-02: qty 1

## 2016-06-02 MED ORDER — IBUPROFEN 200 MG PO TABS: 400.0000 mg | ORAL_TABLET | Freq: Every evening | ORAL | Status: DC | PRN

## 2016-06-02 MED ORDER — SODIUM CHLORIDE 0.9% FLUSH
3.0000 mL | Freq: Two times a day (BID) | INTRAVENOUS | Status: DC
  Administered 2016-06-03 (×2): 3 mL via INTRAVENOUS

## 2016-06-02 MED ORDER — ONDANSETRON HCL 4 MG PO TABS
4.0000 mg | ORAL_TABLET | Freq: Four times a day (QID) | ORAL | Status: DC | PRN
Start: 2016-06-02 — End: 2016-06-03

## 2016-06-02 MED ORDER — PROCHLORPERAZINE EDISYLATE 5 MG/ML IJ SOLN
10.0000 mg | Freq: Once | INTRAMUSCULAR | Status: AC
  Administered 2016-06-02: 10 mg via INTRAVENOUS
  Filled 2016-06-02: qty 2

## 2016-06-02 MED ORDER — IRBESARTAN 150 MG PO TABS
75.0000 mg | ORAL_TABLET | Freq: Every day | ORAL | Status: DC
  Administered 2016-06-03: 75 mg via ORAL
  Filled 2016-06-02 (×2): qty 1

## 2016-06-02 MED ORDER — FOSFOMYCIN TROMETHAMINE 3 G PO PACK
3.0000 g | PACK | Freq: Once | ORAL | Status: DC
  Filled 2016-06-02 (×2): qty 3

## 2016-06-02 MED ORDER — ACETAMINOPHEN 650 MG RE SUPP: 650.0000 mg | Freq: Four times a day (QID) | RECTAL | Status: DC | PRN

## 2016-06-02 MED ORDER — VALSARTAN-HYDROCHLOROTHIAZIDE 80-12.5 MG PO TABS: 1.0000 | ORAL_TABLET | Freq: Every day | ORAL | Status: DC

## 2016-06-02 NOTE — Progress Notes (Signed)
Robin Wall  Arrived from ED around 2315 alert and oriented X 4 she did know the day and time, IV fluid started  she does have a mild head ache. Will continue to monitor.

## 2016-06-02 NOTE — Consult Note (Signed)
Neurology Consultation Reason for Consult: Memory loss Referring Physician: Eugenio Hoes  CC: Memory loss  History is obtained from: Patient  HPI: Robin Wall is a 70 y.o. female with a history of hypertension, hyperlipidemia presents with memory loss. Her husband states that she seen normal when he left the house at 2 PM. When their daughter called her around 5 PM, however she seemed confused. When the husband right tone, 6, she did not know what she is doing and was walking around the house aimlessly.   While he was driving her to the emergency room, she kept asking where they were going, and what was going on. She kept asking the same questions over and over again. She also has some retrograde amneisa as well, not knowing what month it is or that her mother just moved into assisted living.   She also does complain of frontal headache. She does have a history of migraines, but no current photophobia.   LKW: 2pm tpa given?: no, out of window    ROS: A 14 point ROS was performed and is negative except as noted in the HPI.  Past Medical History:  Diagnosis Date  . Allergy   . GERD (gastroesophageal reflux disease)   . Hyperlipidemia   . Hypertension      Family History  Problem Relation Age of Onset  . Hypertension Mother   . Hyperlipidemia Mother   . Diabetes Father   . Heart disease Father     chf  . Hypertension Father   . Hyperlipidemia Father   . Coronary artery disease    . Breast cancer Paternal Grandmother 37     Social History:  reports that she has never smoked. She has never used smokeless tobacco. She reports that she does not drink alcohol or use drugs.   Exam: Current vital signs: BP 151/73   Pulse 90   Temp 98.3 F (36.8 C) (Oral)   Resp 17   Ht 5\' 6"  (1.676 m)   Wt 91.7 kg (202 lb 2.6 oz)   LMP  (LMP Unknown)   SpO2 97%   BMI 32.63 kg/m  Vital signs in last 24 hours: Temp:  [98.3 F (36.8 C)] 98.3 F (36.8 C) (07/31 2012) Pulse Rate:   [90-104] 90 (07/31 2100) Resp:  [17] 17 (07/31 2100) BP: (131-157)/(72-107) 151/73 (07/31 2100) SpO2:  [97 %-99 %] 97 % (07/31 2100) Weight:  [91.7 kg (202 lb 2.6 oz)] 91.7 kg (202 lb 2.6 oz) (07/31 2012)   Physical Exam  Constitutional: Appears well-developed and well-nourished.  Psych: Affect appropriate to situation Eyes: No scleral injection HENT: No OP obstrucion Head: Normocephalic.  Cardiovascular: Normal rate and regular rhythm.  Respiratory: Effort normal and breath sounds normal to anterior ascultation GI: Soft.  No distension. There is no tenderness.  Skin: WDI  Neuro: Mental Status: Patient is awake, alert, oriented to person, place. Does not know month or year.  No signs of aphasia or neglect Cranial Nerves: II: Visual Fields are full. Pupils are equal, round, and reactive to light.   III,IV, VI: EOMI without ptosis or diploplia.  V: Facial sensation is symmetric to temperature VII: Facial movement is symmetric.  VIII: hearing is intact to voice X: Uvula elevates symmetrically XI: Shoulder shrug is symmetric. XII: tongue is midline without atrophy or fasciculations.  Motor: Tone is normal. Bulk is normal. 5/5 strength was present in all four extremities.  Sensory: Sensation is symmetric to light touch and temperature in the arms  and legs. Cerebellar: FNF intact bilaterally    I have reviewed labs in epic and the results pertinent to this consultation are: CMP-unremarkable  I have reviewed the images obtained: MRI brain-negative  Impression: 70 year old female with acute onset anterograde amnesia most consistent with transient global amneisa. Confusional migraine is possible, but given the characteristic presentation, feel TGA more likely.   Recommendations: 1) EEG 2) Compazine/banadryl for headache.  3) Observation for improvement.    Roland Rack, MD [facility] [phone]  If 7pm- 7am, please page neurology on call as listed  in [place].

## 2016-06-02 NOTE — ED Notes (Signed)
Pt returned from MRI °

## 2016-06-02 NOTE — ED Provider Notes (Signed)
[facility] DEPT Provider Note   CSN: [id number] Arrival date & time: 06/02/16  [id number]  First Provider Contact:  None       History   Chief Complaint Chief Complaint  Patient presents with  . Altered Mental Status    HPI Robin Wall is a 70 y.o. female.  HPI   70 year old female with confusion. Pt was last normal at about 2:00 PM. Per her husband, when their daughter called her around 5 PM, she seemed to be confused. Her talking did not make sense. Husband states that pt seems not know what she is doing and was walking around the house aimlessly. She kept asking the same questions repeatedly per husband. She also has moderate and constent frontal headache.  Past Medical History:  Diagnosis Date  . Allergy   . GERD (gastroesophageal reflux disease)   . Hyperlipidemia   . Hypertension     Patient Active Problem List   Diagnosis Date Noted  . Chronic cholecystitis with calculus 02/15/2014  . Hiatal hernia 01/09/2014  . Obesity (BMI 30-39.9) 07/28/2013  . FATIGUE 07/31/2008  . Hyperlipidemia 05/31/2007  . COMMON MIGRAINE 05/31/2007  . Essential hypertension 05/31/2007  . ALLERGIC RHINITIS 05/31/2007    Past Surgical History:  Procedure Laterality Date  . CATARACT EXTRACTION     Bilateral    OB History    No data available       Home Medications    Prior to Admission medications   Medication Sig Start Date End Date Taking? Authorizing Provider  amoxicillin-clavulanate (AUGMENTIN) 875-125 MG tablet Take 1 tablet by mouth 2 (two) times daily. 11/08/15   Rosalita Chessman Chase, DO  aspirin 81 MG tablet Take 81 mg by mouth daily.    Historical Provider, MD  buPROPion (WELLBUTRIN XL) 300 MG 24 hr tablet Take 1 tablet (300 mg total) by mouth daily. 05/20/16   Alferd Apa Lowne Chase, DO  fluticasone (FLONASE) 50 MCG/ACT nasal spray Place 2 sprays into both nostrils daily. 03/13/15   Rosalita Chessman Chase, DO  levocetirizine (XYZAL) 5 MG tablet Take 1 tablet (5 mg  total) by mouth daily. 12/19/15   Rosalita Chessman Chase, DO  Multiple Vitamin (MULTIVITAMIN) tablet Take 1 tablet by mouth daily.    Historical Provider, MD  omeprazole (PRILOSEC) 20 MG capsule TAKE 1 CAPSULE (20 MG TOTAL) BY MOUTH DAILY. 11/12/15   Alferd Apa Lowne Chase, DO  simvastatin (ZOCOR) 40 MG tablet TAKE 1 TABLET BY MOUTH EVERY DAY AT BEDTIME 04/24/16   Debbrah Alar, NP  SUMAtriptan (IMITREX) 100 MG tablet Take 1 tablet (100 mg total) by mouth every 2 (two) hours as needed. 07/27/12   Yvonne R Lowne Chase, DO  valsartan-hydrochlorothiazide (DIOVAN-HCT) 80-12.5 MG tablet TAKE 1 TABLET BY MOUTH DAILY. 04/24/16   Debbrah Alar, NP    Family History Family History  Problem Relation Age of Onset  . Hypertension Mother   . Hyperlipidemia Mother   . Diabetes Father   . Heart disease Father     chf  . Hypertension Father   . Hyperlipidemia Father   . Coronary artery disease    . Breast cancer Paternal Grandmother 90    Social History Social History  Substance Use Topics  . Smoking status: Never Smoker  . Smokeless tobacco: Never Used  . Alcohol use No     Allergies   Review of patient's allergies indicates no known allergies.   Review of Systems Review of Systems  All systems reviewed and  negative, other than as noted in HPI.  Physical Exam Updated Vital Signs LMP  (LMP Unknown)   Physical Exam  Constitutional: She appears well-developed and well-nourished. No distress.  HENT:  Head: Normocephalic and atraumatic.  Eyes: Conjunctivae are normal.  Neck: Neck supple.  Cardiovascular: Normal rate and regular rhythm.   No murmur heard. Pulmonary/Chest: Effort normal and breath sounds normal. No respiratory distress.  Abdominal: Soft. There is no tenderness.  Musculoskeletal: She exhibits no edema.  Neurological: She is alert. No cranial nerve deficit. She exhibits normal muscle tone. Coordination normal.  Disoriented to time. Repetitive questioning.  Skin: Skin  is warm and dry.  Psychiatric: She has a normal mood and affect.  Nursing note and vitals reviewed.    ED Treatments / Results  Labs (all labs ordered are listed, but only abnormal results are displayed) Labs Reviewed  ETHANOL  PROTIME-INR  APTT  CBC  DIFFERENTIAL  COMPREHENSIVE METABOLIC PANEL  URINE RAPID DRUG SCREEN, HOSP PERFORMED  URINALYSIS, ROUTINE W REFLEX MICROSCOPIC (NOT AT [facility])  I-STAT CHEM 8, ED  I-STAT TROPOININ, ED    EKG  EKG Interpretation None       Radiology No results found.  Procedures Procedures (including critical care time)  Medications Ordered in ED Medications - No data to display   Initial Impression / Assessment and Plan / ED Course  I have reviewed the triage vital signs and the nursing notes.  Pertinent labs & imaging results that were available during my care of the patient were reviewed by me and considered in my medical decision making (see chart for details).  Clinical Course    70 year old female with symptoms consistent with transient global amnesia. Initial workup fairly unremarkable. Evaluated by neurology. Given continued symptoms, will admit for observation.  Final Clinical Impressions(s) / ED Diagnoses   Final diagnoses:  Transient global amnesia    New Prescriptions New Prescriptions   No medications on file     Virgel Manifold, MD 06/05/16 2115

## 2016-06-02 NOTE — ED Notes (Signed)
Paged Neuro to MD Wilson Singer

## 2016-06-02 NOTE — H&P (Signed)
History and Physical    CANDIACE GATHRIGHT [id number] DOB: [birth year] DOA: 06/02/2016  Referring MD/NP/PA:   PCP: Ann Held, DO   Patient coming from:  The patient is coming from home.  At baseline, pt is independent for most of ADL.   Chief Complaint: AMS and HA  HPI: Robin Wall is a 70 y.o. female with medical history significant of hypertension, hyperlipidemia, migraine headaches, GERD, depression, allergy, who presents with altered mental status and headache.  Pt was last normal at about 2:00 PM. Per her husband, when their daughter called her around 5 PM, she seemed to be confused. Her talking did not make sense. Husband states that pt seems not know what she is doing and was walking around the house aimlessly. She kept asking the same questions repeatedly per husband. She also has moderate and constent frontal headache. When I saw pt in ED, her mental status has improved. She is mildly confused, but was able to have answered all questions appropriately. She is oriented 3. She states that she continues to have headache, but denies unilateral weakness, numbness or tingling in extremities. No vision change or hearing loss. She denies chest pain, shortness of breath, cough, nausea, vomiting, diarrhea, symptoms of UTI or abdominal pain. No rashes.  ED Course: pt was found to have positive urinalysis with moderate amount of leukocytosis (WBC 6-30 with rare bacterial), INR 0.89, troponin negative, WBC 6.4, temperature normal, slightly tachycardia, no tachypnea, electrolytes and renal function okay, negative chest x-ray for acute abnormalities, CT head is negative for acute intracranial abnormalities. MRI for brain is negative for intracranial abnormalities. Patient is placed on telemetry bed for observation. Neurology, Dr. Leonel Ramsay was consulted.  Review of Systems:   General: no fevers, chills, no changes in body weight, has fatigue HEENT: no blurry vision, hearing changes or  sore throat Pulm: no dyspnea, coughing, wheezing CV: no chest pain, no palpitations Abd: no nausea, vomiting, abdominal pain, diarrhea, constipation GU: no dysuria, burning on urination, increased urinary frequency, hematuria  Ext: no leg edema Neuro: no unilateral weakness, numbness, or tingling, no vision change or hearing loss. Has confusion. Skin: no rash MSK: No muscle spasm, no deformity, no limitation of range of movement in spin Heme: No easy bruising.  Travel history: No recent long distant travel.  Allergy: No Known Allergies  Past Medical History:  Diagnosis Date  . Allergy   . GERD (gastroesophageal reflux disease)   . Hyperlipidemia   . Hypertension     Past Surgical History:  Procedure Laterality Date  . CATARACT EXTRACTION     Bilateral    Social History:  reports that she has never smoked. She has never used smokeless tobacco. She reports that she does not drink alcohol or use drugs.  Family History:  Family History  Problem Relation Age of Onset  . Hypertension Mother   . Hyperlipidemia Mother   . Diabetes Father   . Heart disease Father     chf  . Hypertension Father   . Hyperlipidemia Father   . Coronary artery disease    . Breast cancer Paternal Grandmother 71     Prior to Admission medications   Medication Sig Start Date End Date Taking? Authorizing Provider  ibuprofen (ADVIL,MOTRIN) 200 MG tablet Take 400 mg by mouth at bedtime as needed (pain/ sleep).   Yes Historical Provider, MD  Multiple Vitamin (MULTIVITAMIN WITH MINERALS) TABS tablet Take 1 tablet by mouth at bedtime.   Yes Historical Provider, MD  omeprazole (PRILOSEC) 20 MG capsule TAKE 1 CAPSULE (20 MG TOTAL) BY MOUTH DAILY. Patient taking differently: TAKE 1 CAPSULE (20 MG TOTAL) BY MOUTH DAILY AT BEDTIME 11/12/15  Yes Yvonne R Lowne Chase, DO  simvastatin (ZOCOR) 40 MG tablet TAKE 1 TABLET BY MOUTH EVERY DAY AT BEDTIME 04/24/16  Yes Debbrah Alar, NP  buPROPion (WELLBUTRIN XL) 300  MG 24 hr tablet Take 1 tablet (300 mg total) by mouth daily. Patient taking differently: Take 300 mg by mouth at bedtime.  05/20/16   Alferd Apa Lowne Chase, DO  fluticasone (FLONASE) 50 MCG/ACT nasal spray Place 2 sprays into both nostrils daily. Patient not taking: Reported on 06/02/2016 03/13/15   Rosalita Chessman Chase, DO  levocetirizine (XYZAL) 5 MG tablet Take 1 tablet (5 mg total) by mouth daily. Patient not taking: Reported on 06/02/2016 12/19/15   Rosalita Chessman Chase, DO  SUMAtriptan (IMITREX) 100 MG tablet Take 1 tablet (100 mg total) by mouth every 2 (two) hours as needed. Patient not taking: Reported on 06/02/2016 07/27/12   Alferd Apa Lowne Chase, DO  valsartan-hydrochlorothiazide (DIOVAN-HCT) 80-12.5 MG tablet TAKE 1 TABLET BY MOUTH DAILY. Patient taking differently: TAKE 1 TABLET BY MOUTH DAILY AT BEDTIME 04/24/16   Debbrah Alar, NP    Physical Exam: Vitals:   06/02/16 2012 06/02/16 2030 06/02/16 2052 06/02/16 2100  BP: (!) 131/107 157/72  151/73  Pulse: 104 93  90  Resp: 17 17  17   Temp: 98.3 F (36.8 C)     TempSrc: Oral     SpO2: 98% 99% 99% 97%  Weight: 91.7 kg (202 lb 2.6 oz)     Height: 5\' 6"  (1.676 m)      General: Not in acute distress HEENT:       Eyes: PERRL, EOMI, no scleral icterus.       ENT: No discharge from the ears and nose, no pharynx injection, no tonsillar enlargement.        Neck: No JVD, no bruit, no mass felt. Heme: No neck lymph node enlargement. Cardiac: S1/S2, RRR, No murmurs, No gallops or rubs. Pulm: No rales, wheezing, rhonchi or rubs. Abd: Soft, nondistended, nontender, no rebound pain, no organomegaly, BS present. GU: No hematuria Ext: No pitting leg edema bilaterally. 2+DP/PT pulse bilaterally. Musculoskeletal: No joint deformities, No joint redness or warmth, no limitation of ROM in spin. Skin: No rashes.  Neuro: mildly confused, but oriented X3, cranial nerves II-XII grossly intact, moves all extremities normally. Muscle strength 5/5 in  all extremities, sensation to light touch intact. Brachial reflex 2+ bilaterally. Negative Babinski's sign. Psych: Patient is not psychotic, no suicidal or hemocidal ideation.  Labs on Admission: I have personally reviewed following labs and imaging studies  CBC:  Recent Labs Lab 06/02/16 2016 06/02/16 2029  WBC 6.4  --   NEUTROABS 4.7  --   HGB 11.9* 12.2  HCT 36.8 36.0  MCV 85.6  --   PLT 223  --    Basic Metabolic Panel:  Recent Labs Lab 06/02/16 2016 06/02/16 2029  NA 138 141  K 3.7 3.7  CL 104 102  CO2 26  --   GLUCOSE 103* 105*  BUN 12 13  CREATININE 0.72 0.70  CALCIUM 9.5  --    GFR: Estimated Creatinine Clearance: 75.8 mL/min (by C-G formula based on SCr of 0.8 mg/dL). Liver Function Tests:  Recent Labs Lab 06/02/16 2016  AST 27  ALT 24  ALKPHOS 71  BILITOT 0.3  PROT 6.5  ALBUMIN 4.3   No results for input(s): LIPASE, AMYLASE in the last 168 hours. No results for input(s): AMMONIA in the last 168 hours. Coagulation Profile:  Recent Labs Lab 06/02/16 2016  INR 0.89   Cardiac Enzymes: No results for input(s): CKTOTAL, CKMB, CKMBINDEX, TROPONINI in the last 168 hours. BNP (last 3 results) No results for input(s): PROBNP in the last 8760 hours. HbA1C: No results for input(s): HGBA1C in the last 72 hours. CBG:  Recent Labs Lab 06/02/16 2008  GLUCAP 133*   Lipid Profile: No results for input(s): CHOL, HDL, LDLCALC, TRIG, CHOLHDL, LDLDIRECT in the last 72 hours. Thyroid Function Tests: No results for input(s): TSH, T4TOTAL, FREET4, T3FREE, THYROIDAB in the last 72 hours. Anemia Panel: No results for input(s): VITAMINB12, FOLATE, FERRITIN, TIBC, IRON, RETICCTPCT in the last 72 hours. Urine analysis:    Component Value Date/Time   COLORURINE YELLOW 06/02/2016 2219   [place] 06/02/2016 2219   LABSPEC 1.013 06/02/2016 2219   PHURINE 6.0 06/02/2016 2219   GLUCOSEU NEGATIVE 06/02/2016 2219   HGBUR TRACE (A) 06/02/2016 2219    HGBUR negative 08/03/2009 0000   BILIRUBINUR NEGATIVE 06/02/2016 2219   BILIRUBINUR Neg 07/28/2013 1608   KETONESUR NEGATIVE 06/02/2016 2219   PROTEINUR NEGATIVE 06/02/2016 2219   UROBILINOGEN 0.2 07/28/2013 1608   UROBILINOGEN negative 08/03/2009 0000   NITRITE NEGATIVE 06/02/2016 2219   LEUKOCYTESUR MODERATE (A) 06/02/2016 2219   Sepsis Labs: @LABRCNTIP (procalcitonin:4,lacticidven:4) )No results found for this or any previous visit (from the past 240 hour(s)).   Radiological Exams on Admission: Mr Brain Wo Contrast  Result Date: 06/02/2016 CLINICAL DATA:  Confusion beginning this afternoon.  Amnesia. EXAM: MRI HEAD WITHOUT CONTRAST TECHNIQUE: Multiplanar, multiecho pulse sequences of the brain and surrounding structures were obtained without intravenous contrast. COMPARISON:  Head CT earlier same day. FINDINGS: Diffusion imaging does not show any acute or subacute infarction. The brainstem and cerebellum are normal. No significant small-vessel ischemic changes affecting the brain. No cortical or large vessel territory infarction. No mass lesion, hemorrhage, hydrocephalus or extra-axial collection. No pituitary mass. No inflammatory sinus disease. No skull or skullbase lesion. Major vessels at the base of the brain show flow. IMPRESSION: Normal MRI of the brain for age. No acute or reversible finding. No significant small vessel change. Electronically Signed   By: Nelson Chimes M.D.   On: 06/02/2016 21:55   Ct Head Code Stroke W/o Cm  Result Date: 06/02/2016 CLINICAL DATA:  Code stroke. Onset of confusion and headache. Last seen normal 1400 hours. EXAM: CT HEAD WITHOUT CONTRAST TECHNIQUE: Contiguous axial images were obtained from the base of the skull through the vertex without intravenous contrast. COMPARISON:  None. FINDINGS: The brain does not show accelerated atrophy. No sign of old or acute small or large vessel infarction. No mass lesion, hemorrhage, hydrocephalus or extra-axial  collection. No calvarial abnormality. Sinuses, middle ears and mastoids are clear. ASPECTS [facility] Stroke Program Early CT Score, [email or web address]) - Ganglionic level infarction (caudate, lentiform nuclei, internal capsule, insula, M1-M3 cortex): 7 - Supraganglionic infarction (M4-M6 cortex): 3 Total score (0-10): 10 IMPRESSION: 1. Negative head CT. 2. ASPECTS score 10 These results were called by telephone at the time of interpretation on 06/02/2016 at 8:11 pm to Dr. Virgel Manifold , who verbally acknowledged these results. Electronically Signed   By: Nelson Chimes M.D.   On: 06/02/2016 20:17     EKG: Independently reviewed. Sinus rhythm, tachycardia, QTC 486, no ischemic change.  Assessment/Plan Principal Problem:   Acute encephalopathy  Active Problems:   Hyperlipidemia   Essential hypertension   GERD (gastroesophageal reflux disease)   Transient global amnesia   Depression   Acute encephalopathy: Etiology is not clear. CT head and MRI of the brain negative for acute intracranial abnormalities. Neurology Dr. Leonel Ramsay was consulted. Per Dr. Leonel Ramsay, patient's symptoms are consistent with transient global amnesia; confusional migraine is possible. Patient's mental status has improved. Currently is oriented 3.  -will place on tele bed for obs -highly appreciate Dr. Cecil Cobbs consultation, with follow-up recommendations as follows:  1) EEG  2) Compazine/banadryl for headache.   3) Observation for improvement -Frequent neuro check  HLD: Last LDL was 94 on 07/30/15 -Continue home medications: Zocor  GERD: -Protonix  Depression and anxiety: Stable, no suicidal or homicidal ideations. -Continue home medications: Wellbutrin  HTN: Blood pressure 171/107 -Irbesartan and HCTZ  Positive urinalysis: urinalysis with moderate amount of leukocytosis (WBC 6-30 with rare bacterial), dose not have typical UTI, but given her AMS, will give 1 dose of fosfomycin. -3 g of  fosfomycin 1 -Follow-up urine culture   DVT ppx: SQ Lovenox Code Status: Full code Family Communication: None at bed side.  Disposition Plan:  Anticipate discharge back to previous home environment Consults called:  Neurology, Dr. Leonel Ramsay Admission status: Obs / tele        Date of Service 06/02/2016    Ivor Costa [facility] Pager [phone]  If 7PM-7AM, please contact night-coverage [email or web address] Password [facility] 06/02/2016, 10:52 PM

## 2016-06-02 NOTE — ED Triage Notes (Signed)
Patient's family reported pt.'s confusion this afternoon , LSN - 2 pm this afternoon , pt. Is confused to time and place at triage , Dr. Wilson Singer evaluated pt. at arrival . Speech  clear . No facial asymmetry , equal grips with no arm drift. EDP initiated Code Stroke .

## 2016-06-02 NOTE — ED Notes (Signed)
Pt to MRI

## 2016-06-02 NOTE — ED Notes (Signed)
Per Wilson Singer MD do not officially call Code Stroke.

## 2016-06-02 NOTE — ED Notes (Signed)
Neurologist at bedside. 

## 2016-06-02 NOTE — ED Notes (Signed)
Dr Niu at bedside 

## 2016-06-03 ENCOUNTER — Encounter (HOSPITAL_COMMUNITY): Payer: Self-pay | Admitting: General Practice

## 2016-06-03 ENCOUNTER — Observation Stay (HOSPITAL_BASED_OUTPATIENT_CLINIC_OR_DEPARTMENT_OTHER)
Admit: 2016-06-03 | Discharge: 2016-06-03 | Disposition: A | Discharge status: Home / Self Care | Payer: Federal, State, Local not specified - PPO | Attending: Neurology | Admitting: Neurology

## 2016-06-03 DIAGNOSIS — R413 Other amnesia: Secondary | ICD-10-CM | POA: Diagnosis not present

## 2016-06-03 DIAGNOSIS — G9340 Encephalopathy, unspecified: Secondary | ICD-10-CM | POA: Diagnosis not present

## 2016-06-03 LAB — BASIC METABOLIC PANEL
Anion gap: 7 (ref 5–15)
BUN: 9 mg/dL (ref 6–20)
CHLORIDE: 104 mmol/L (ref 101–111)
CO2: 29 mmol/L (ref 22–32)
CREATININE: 0.65 mg/dL (ref 0.44–1.00)
Calcium: 9 mg/dL (ref 8.9–10.3)
GFR calc Af Amer: >60 mL/min (ref >60)
GFR calc non Af Amer: >60 mL/min (ref >60)
GLUCOSE: 101 mg/dL — AB (ref 65–99)
POTASSIUM: 3.3 mmol/L — AB (ref 3.5–5.1)
SODIUM: 140 mmol/L (ref 135–145)

## 2016-06-03 LAB — CBC
HEMATOCRIT: 34 % — AB (ref 36.0–46.0)
Hemoglobin: 11 g/dL — ABNORMAL LOW (ref 12.0–15.0)
MCH: 27.7 pg (ref 26.0–34.0)
MCHC: 32.4 g/dL (ref 30.0–36.0)
MCV: 85.6 fL (ref 78.0–100.0)
PLATELETS: 209 10*3/uL (ref 150–400)
RBC: 3.97 MIL/uL (ref 3.87–5.11)
RDW: 14 % (ref 11.5–15.5)
WBC: 5.1 10*3/uL (ref 4.0–10.5)

## 2016-06-03 MED ORDER — BUPROPION HCL ER (XL) 300 MG PO TB24: 300.0000 mg | ORAL_TABLET | Freq: Every day | ORAL | Status: DC

## 2016-06-03 MED ORDER — VALSARTAN-HYDROCHLOROTHIAZIDE 80-12.5 MG PO TABS: 1.0000 | ORAL_TABLET | Freq: Every day | ORAL | Status: DC

## 2016-06-03 MED ORDER — OMEPRAZOLE 20 MG PO CPDR: DELAYED_RELEASE_CAPSULE | ORAL | 0 refills | Status: DC

## 2016-06-03 NOTE — Discharge Instructions (Signed)
Transient Global Amnesia Transient global amnesia causes a sudden and temporary (transient) loss of memory (amnesia). While you may recall memories from your distant past, including being able to recognize people you know well, you may not recall things that happened more recently in the past days, months, or even year. A transient global amnesia episode does not last longer than 24 hours.  Transient global amnesia does not affect your other brain functions. Your memory usually returns to normal after an episode is over. One episode of transient global amnesia does not make you more likely to have a stroke, a relapse, or other complications.  CAUSES  The cause of this condition is not known.  RISK FACTORS  Transient global amnesia is more likely to develop in people who:  Are 29-34 years old.  Have a history of migraine headaches. SYMPTOMS  The main symptoms of this condition include:  The inability to remember recent events.  Asking repetitive questions about the situation and surroundings and not recalling the answers to these questions. Other symptoms include:  Restlessness and nervousness.  Confusion.  Headaches.  Dizziness.  Nausea. DIAGNOSIS  Your health care provider may suspect transient global amnesia based on your symptoms. Your health care provider will do a physical exam. This may include a test to check your mental abilities (cognitive evaluation). You may also have imaging studies done to check your brain function. These may include:   Electroencephalography (EEG).  Diffusion-weighted imaging (DWI).  MRI. TREATMENT  There is no treatment for this condition. An episode typically goes away on its own after a few hours. If you also have a seizure or migraine during an episode, you will receive treatment for these conditions. This may include medicines. HOME CARE INSTRUCTIONS  Take medicines only as directed by your health care provider.  Tell your family or  friends that you have transient global amnesia. Ask them to help you avoid physical exertion, including sexual intercourse, swimming, and straining while holding your breath (Valsalva maneuver), until the episode passes. These are events that can bring on transient global amnesia attacks. SEEK MEDICAL CARE IF:   You have a migraine and it does not go away after you have followed your treatment plan for this condition.  You have a seizure for the first time, or a seizure that is different from seizures you normally have.  You experience transient global amnesia repeatedly.   This information is not intended to replace advice given to you by your health care provider. Make sure you discuss any questions you have with your health care provider.   Document Released: 11/27/2004 Document Revised: 07/11/2015 Document Reviewed: 07/05/2014 Elsevier Interactive Patient Education [employer].

## 2016-06-03 NOTE — Discharge Summary (Signed)
DISCHARGE SUMMARY  KAE WHARFF  MR#: [id number]  DOB:25-Sep-1946  Date of Admission: 06/02/2016 Date of Discharge: 06/03/2016  Attending Physician:Rainna Nearhood T  Patient's AQ:[id number] R Carollee Herter, DO  Consults:  Neurology   Disposition: Discharge home with husband  Follow-up Appts: Follow-up Information    Rosalita Chessman Chase, DO. Schedule an appointment as soon as possible for a visit in 1 week(s).   Specialty:  Family Medicine Contact information: [address] [place] [address] [phone]           Discharge Diagnoses: Acute encephalopathy - transient global amnesia Positive UA - asymptomatic  Hyperlipidemia HTN GERD Depression and anxiety  Initial presentation: 70 y.o.femalewith history of hypertension, hyperlipidemia, migraine headaches, GERD, and depression who presented with altered mental status and headache.  Pt was last known normal at 2:00 PM. When their daughter called her around 5 PM, she seemed to be confused. Her talking did not make sense and she was walking around the house aimlessly. She kept asking the same questions repeatedly. She also had a moderate constent frontal headache.   In the ED the pt was found to have positive urinalysis with WBC 6-30, slightly tachycardia, no tachypnea, electrolytes and renal function okay, negative chest x-ray, CT head negative for acute intracranial abnormalities. MRI for brain was also negative for intracranial abnormalities.  Hospital Course:  Acute encephalopathy - transient global amnesia Negative CT scan head - negative MRI head - EEG unrevealing - Neurology did not feel further workup was required - the patient had returned to her baseline with no deficits whatsoever and desired discharge home  Positive UA The patient was entirely asymptomatic - a dose of fosfomycin was ordered at the time of her admission but it appears this was not given - in that she is asymptomatic and has  returned to her baseline mental status and do not feel that treatment is currently indicated  Hyperlipidemia Continue usual home medical therapy  HTN Well-controlled at time of discharge  GERD Continue usual home medical therapy  Depression and anxiety Quiescent    Medication List    STOP taking these medications   fluticasone 50 MCG/ACT nasal spray Commonly known as:  FLONASE   levocetirizine 5 MG tablet Commonly known as:  XYZAL   SUMAtriptan 100 MG tablet Commonly known as:  IMITREX     TAKE these medications   buPROPion 300 MG 24 hr tablet Commonly known as:  WELLBUTRIN XL Take 1 tablet (300 mg total) by mouth at bedtime.   ibuprofen 200 MG tablet Commonly known as:  ADVIL,MOTRIN Take 400 mg by mouth at bedtime as needed (pain/ sleep).   multivitamin with minerals Tabs tablet Take 1 tablet by mouth at bedtime.   omeprazole 20 MG capsule Commonly known as:  PRILOSEC TAKE 1 CAPSULE (20 MG TOTAL) BY MOUTH DAILY AT BEDTIME What changed:  See the new instructions.   simvastatin 40 MG tablet Commonly known as:  ZOCOR TAKE 1 TABLET BY MOUTH EVERY DAY AT BEDTIME   valsartan-hydrochlorothiazide 80-12.5 MG tablet Commonly known as:  DIOVAN-HCT Take 1 tablet by mouth at bedtime. What changed:  See the new instructions.       Day of Discharge BP 126/71 (BP Location: Left Arm)   Pulse 81   Temp 98.1 F (36.7 C) (Oral)   Resp 16   Ht 5\' 6"  (1.676 m)   Wt 91.7 kg (202 lb 2.6 oz)   LMP  (LMP Unknown)   SpO2  97%   BMI 32.63 kg/m   Physical Exam: General: No acute respiratory distress Lungs: Clear to auscultation bilaterally without wheezes or crackles Cardiovascular: Regular rate and rhythm without murmur gallop or rub normal S1 and S2 Abdomen: Nontender, nondistended, soft, bowel sounds positive, no rebound, no ascites, no appreciable mass Extremities: No significant cyanosis, clubbing, or edema bilateral lower extremities Neuro:  Alert and oriented  4, cranial nerves II-12 intact bilaterally, 5 over 5 strength bilateral upper and lower studies  Basic Metabolic Panel:  Recent Labs Lab 06/02/16 2016 06/02/16 2029 06/03/16 0538  NA 138 141 140  K 3.7 3.7 3.3*  CL 104 102 104  CO2 26  --  29  GLUCOSE 103* 105* 101*  BUN 12 13 9   CREATININE 0.72 0.70 0.65  CALCIUM 9.5  --  9.0    Liver Function Tests:  Recent Labs Lab 06/02/16 2016  AST 27  ALT 24  ALKPHOS 71  BILITOT 0.3  PROT 6.5  ALBUMIN 4.3    Coags:  Recent Labs Lab 06/02/16 2016  INR 0.89    CBC:  Recent Labs Lab 06/02/16 2016 06/02/16 2029 06/03/16 0538  WBC 6.4  --  5.1  NEUTROABS 4.7  --   --   HGB 11.9* 12.2 11.0*  HCT 36.8 36.0 34.0*  MCV 85.6  --  85.6  PLT 223  --  209    CBG:  Recent Labs Lab 06/02/16 2008  GLUCAP 133*    Time spent in discharge (includes decision making & examination of pt): <30 minutes  06/03/2016, 5:00 PM   Cherene Altes, MD [facility] Office  [phone] Pager [phone]  On-Call/Text Page:      [email or web address]      password [facility]

## 2016-06-03 NOTE — Progress Notes (Signed)
Pt down to EEG

## 2016-06-03 NOTE — Progress Notes (Signed)
EEG completed; results pending.    

## 2016-06-03 NOTE — Evaluation (Signed)
Physical Therapy Evaluation & Discharge Patient Details Name: Robin Wall MRN: [id number] DOB: 11-11-1945 Today's Date: 06/03/2016   History of Present Illness  Robin Wall a 70 y.o.femalewith medical history significant of hypertension, hyperlipidemia, migraine headaches, GERD, depression, allergy, who was admitted with altered mental status and headache.  Clinical Impression  Pt admitted with the above. Pt is A,Ox4 and is functioning at baseline. Pt is minimal fall risk as demonstrated by a DGI score of 23. No follow up PT recommended. All education is completed and no further acute services required. Pt is safe to return home with husband as soon as medically stable. PT signing off.     Follow Up Recommendations No PT follow up    Equipment Recommendations  None recommended by PT    Recommendations for Other Services       Precautions / Restrictions Precautions Precautions: Fall Restrictions Weight Bearing Restrictions: No      Mobility  Bed Mobility Overal bed mobility: Modified Independent             General bed mobility comments: HOB raised  Transfers Overall transfer level: Independent Equipment used: None                Ambulation/Gait Ambulation/Gait assistance: Independent Ambulation Distance (Feet): 300 Feet Assistive device: None Gait Pattern/deviations: WFL(Within Functional Limits);Step-through pattern   Gait velocity interpretation: at or above normal speed for age/gender    Stairs Stairs: Yes Stairs assistance: Modified independent (Device/Increase time) Stair Management: One rail Left Number of Stairs: 10    Wheelchair Mobility    Modified Rankin (Stroke Patients Only) Modified Rankin (Stroke Patients Only) Pre-Morbid Rankin Score: No symptoms Modified Rankin: No symptoms     Balance Overall balance assessment: Independent                               Standardized Balance Assessment Standardized  Balance Assessment : Dynamic Gait Index   Dynamic Gait Index Level Surface: Normal Change in Gait Speed: Normal Gait with Horizontal Head Turns: Normal Gait with Vertical Head Turns: Normal Gait and Pivot Turn: Normal Step Over Obstacle: Normal Step Around Obstacles: Normal Steps: Mild Impairment Total Score: 23       Pertinent Vitals/Pain Pain Assessment: No/denies pain    Home Living Family/patient expects to be discharged to:: Private residence Living Arrangements: Spouse/significant other Available Help at Discharge: Family Type of Home: House Home Access: Level entry     Home Layout: One level Home Equipment: None      Prior Function Level of Independence: Independent         Comments[place]     Hand Dominance        Extremity/Trunk Assessment   Upper Extremity Assessment: Overall WFL for tasks assessed           Lower Extremity Assessment: Overall WFL for tasks assessed         Communication   Communication: No difficulties  Cognition Arousal/Alertness: Awake/alert Behavior During Therapy: WFL for tasks assessed/performed Overall Cognitive Status: Within Functional Limits for tasks assessed                      General Comments      Exercises        Assessment/Plan    PT Assessment Patent does not need any further PT services  PT Diagnosis Difficulty walking  PT Problem List    PT Treatment Interventions     PT Goals (Current goals can be found in the Care Plan section) Acute Rehab PT Goals Patient Stated Goal: none stated PT Goal Formulation: All assessment and education complete, DC therapy    Frequency     Barriers to discharge        Co-evaluation               End of Session Equipment Utilized During Treatment: Gait belt Activity Tolerance: Patient tolerated treatment well Patient left: in chair;with call bell/phone within reach Nurse Communication: Mobility  status    Functional Assessment Tool Used: clinical judgement Functional Limitation: Mobility: Walking and moving around Mobility: Walking and Moving Around Current Status [phone]): 0 percent impaired, limited or restricted Mobility: Walking and Moving Around Goal Status [id number]): 0 percent impaired, limited or restricted Mobility: Walking and Moving Around Discharge Status [phone]): 0 percent impaired, limited or restricted    Time: [id number] PT Time Calculation (min) (ACUTE ONLY): 17 min   Charges:   PT Evaluation $PT Eval Low Complexity: 1 Procedure     PT G Codes:   PT G-Codes **NOT FOR INPATIENT CLASS** Functional Assessment Tool Used: clinical judgement Functional Limitation: Mobility: Walking and moving around Mobility: Walking and Moving Around Current Status [id number]): 0 percent impaired, limited or restricted Mobility: Walking and Moving Around Goal Status [id number]): 0 percent impaired, limited or restricted Mobility: Walking and Moving Around Discharge Status [phone]): 0 percent impaired, limited or restricted    [facility] 06/03/2016, 10:57 AM  Tawni Millers, SPT (student physical therapist) Acute Rehabilitation Services [phone]

## 2016-06-03 NOTE — Progress Notes (Signed)
Occupational Therapy Evaluation Patient Details Name: Robin Wall MRN: [id number] DOB: January 31, 1946 Today's Date: 06/03/2016    History of Present Illness Robin PENDERGRAST a 70 y.o.femalewith medical history significant of hypertension, hyperlipidemia, migraine headaches, GERD, depression, allergy, who was admitted with altered mental status and headache.   Clinical Impression   Patient independent with ADLs and mental status appears to be back to baseline. No further OT needs. Will sign off.    Follow Up Recommendations  No OT follow up    Equipment Recommendations  None recommended by OT    Recommendations for Other Services       Precautions / Restrictions Precautions Precautions: Fall Restrictions Weight Bearing Restrictions: No      Mobility Bed Mobility               General bed mobility comments: NT -- sitting in recliner  Transfers Overall transfer level: Independent Equipment used: None                  Balance                                            ADL Overall ADL's : Independent                                       General ADL Comments: needed A to manage IV pole only. Patient ambulated in room, toileted, brushed teeth at sink -- all at independent level. Returned to recliner at end of session.     Vision     Perception     Praxis      Pertinent Vitals/Pain Pain Assessment: No/denies pain     Hand Dominance     Extremity/Trunk Assessment Upper Extremity Assessment Upper Extremity Assessment: Overall WFL for tasks assessed   Lower Extremity Assessment Lower Extremity Assessment: Defer to PT evaluation       Communication Communication Communication: No difficulties   Cognition Arousal/Alertness: Awake/alert Behavior During Therapy: WFL for tasks assessed/performed Overall Cognitive Status: Within Functional Limits for tasks assessed                     General  Comments       Exercises       Shoulder Instructions      Home Living Family/patient expects to be discharged to:: Private residence Living Arrangements: Spouse/significant other Available Help at Discharge: Family Type of Home: House Home Access: Level entry     Home Layout: One level     Bathroom Shower/Tub: Teacher, Gian[employer] years/pre: Standard     Home Equipment: None          Prior Functioning/Environment Level of Independence: Independent        Comments: [place].    OT Diagnosis: Generalized weakness   OT Problem List: Other (comment) (amnesia)   OT Treatment/Interventions:      OT Goals(Current goals can be found in the care plan section) Acute Rehab OT Goals Patient Stated Goal: to go home today OT Goal Formulation: All assessment and education complete, DC therapy  OT Frequency:     Barriers to D/C:            Co-evaluation  End of Session    Activity Tolerance: Patient tolerated treatment well Patient left: in chair;with call bell/phone within reach   Time: 1216-1227 OT Time Calculation (min): 11 min Charges:  OT General Charges $OT Visit: 1 Procedure OT Evaluation $OT Eval Low Complexity: 1 Procedure G-Codes: OT G-codes **NOT FOR INPATIENT CLASS** Functional Assessment Tool Used: clinical judgment Functional Limitation: Self care Self Care Current Status [id number]): 0 percent impaired, limited or restricted Self Care Goal Status [id number]): 0 percent impaired, limited or restricted Self Care Discharge Status [id number]): 0 percent impaired, limited or restricted  Robin Wall A 06/03/2016, 1:25 PM

## 2016-06-03 NOTE — Procedures (Signed)
ELECTROENCEPHALOGRAM REPORT  Date of Study: 06/03/2016  Patient's Name: Robin Wall MRN: [id number] Date of Birth: Feb 07, 1946  Referring Provider: Dr. Melba Coon  Clinical History: This is a 70 year old woman with acute onset transient anterograde amnesia  Medications: acetaminophen (TYLENOL) tablet 650 mg  buPROPion (WELLBUTRIN XL) 24 hr tablet 300 mg  fosfomycin (MONUROL) packet 3 g  hydrochlorothiazide (MICROZIDE) capsule 12.5 mg  irbesartan (AVAPRO) tablet 75 mg  simvastatin (ZOCOR) tablet 40 mg   Technical Summary: A multichannel digital EEG recording measured by the international 10-20 system with electrodes applied with paste and impedances below 5000 ohms performed in our laboratory with EKG monitoring in an awake and asleep patient.  Hyperventilation and photic stimulation were not performed.  The digital EEG was referentially recorded, reformatted, and digitally filtered in a variety of bipolar and referential montages for optimal display.    Description: The patient is awake and asleep during the recording.  During maximal wakefulness, there is a symmetric, medium voltage 11 Hz posterior dominant rhythm that attenuates with eye opening.  The record is symmetric.  During drowsiness and sleep, there is an increase in theta slowing of the background, with shifting asymmetry over the bilateral temporal regions.  Vertex waves and symmetric sleep spindles were seen.  Hyperventilation and photic stimulation were not performed.  There were no epileptiform discharges or electrographic seizures seen.    EKG lead was unremarkable.  Impression: This awake and asleep EEG is within normal limits for age.  Clinical Correlation: A normal EEG does not exclude a clinical diagnosis of epilepsy. Clinical correlation is advised.   Ellouise Newer, M.D.

## 2016-06-03 NOTE — Care Management Note (Addendum)
Case Management Note  Patient Details  Name: Robin Wall MRN: [id number] Date of Birth: Mar 04, 1946  Subjective/Objective:    Pt in with acute encephalopathy. She is from home with her spouse.                 Action/Plan: No f/u per PT/OT.  CM following for discharge needs.   Expected Discharge Date:                  Expected Discharge Plan:  Home/Self Care  In-House Referral:     Discharge planning Services     Post Acute Care Choice:    Choice offered to:     DME Arranged:    DME Agency:     HH Arranged:    HH Agency:     Status of Service:  In process, will continue to follow  If discussed at Long Length of Stay Meetings, dates discussed:    Additional Comments:  Pollie Friar, RN 06/03/2016, 1:43 PM

## 2016-06-03 NOTE — Progress Notes (Addendum)
  Pt discharged home with family, by car, assessment stable, discharge instructions reviewed, no prescriptions given, all questions answered. IV removed. Telemetry removed. Pt taken by wheelchair to exit. Time of discharge: 1535
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
## 2016-06-03 NOTE — Progress Notes (Signed)
Pt back from EEG

## 2016-06-04 LAB — URINE CULTURE: Culture: NO GROWTH

## 2016-06-12 ENCOUNTER — Ambulatory Visit (INDEPENDENT_AMBULATORY_CARE_PROVIDER_SITE_OTHER): Payer: Federal, State, Local not specified - PPO | Admitting: Family Medicine

## 2016-06-12 ENCOUNTER — Encounter: Payer: Self-pay | Admitting: Family Medicine

## 2016-06-12 VITALS — BP 122/78 | HR 89 | Temp 98.1°F | Wt 199.0 lb

## 2016-06-12 DIAGNOSIS — N390 Urinary tract infection, site not specified: Secondary | ICD-10-CM

## 2016-06-12 DIAGNOSIS — G454 Transient global amnesia: Secondary | ICD-10-CM | POA: Diagnosis not present

## 2016-06-12 DIAGNOSIS — E785 Hyperlipidemia, unspecified: Secondary | ICD-10-CM

## 2016-06-12 DIAGNOSIS — R82998 Other abnormal findings in urine: Secondary | ICD-10-CM

## 2016-06-12 DIAGNOSIS — I10 Essential (primary) hypertension: Secondary | ICD-10-CM | POA: Diagnosis not present

## 2016-06-12 LAB — COMPREHENSIVE METABOLIC PANEL
ALK PHOS: 64 U/L (ref 39–117)
ALT: 24 U/L (ref 0–35)
AST: 20 U/L (ref 0–37)
Albumin: 4.5 g/dL (ref 3.5–5.2)
BUN: 14 mg/dL (ref 6–23)
CALCIUM: 9.7 mg/dL (ref 8.4–10.5)
CHLORIDE: 101 meq/L (ref 96–112)
CO2: 30 meq/L (ref 19–32)
CREATININE: 0.81 mg/dL (ref 0.40–1.20)
GFR: 74.31 mL/min (ref >60.00)
Glucose, Bld: 95 mg/dL (ref 70–99)
POTASSIUM: 4 meq/L (ref 3.5–5.1)
SODIUM: 137 meq/L (ref 135–145)
Total Bilirubin: 0.4 mg/dL (ref 0.2–1.2)
Total Protein: 7 g/dL (ref 6.0–8.3)

## 2016-06-12 LAB — POCT URINALYSIS DIPSTICK
Bilirubin, UA: NEGATIVE
Blood, UA: NEGATIVE
GLUCOSE UA: NEGATIVE
Ketones, UA: NEGATIVE
Nitrite, UA: NEGATIVE
Protein, UA: NEGATIVE
SPEC GRAV UA: 1.025
UROBILINOGEN UA: 0.2
pH, UA: 5.5

## 2016-06-12 LAB — LIPID PANEL
CHOLESTEROL: 167 mg/dL (ref 0–200)
HDL: 64.9 mg/dL (ref >39.00)
LDL CALC: 81 mg/dL (ref 0–99)
NonHDL: 101.61
TRIGLYCERIDES: 105 mg/dL (ref 0.0–149.0)
Total CHOL/HDL Ratio: 3
VLDL: 21 mg/dL (ref 0.0–40.0)

## 2016-06-12 NOTE — Patient Instructions (Signed)
Transient Global Amnesia Transient global amnesia causes a sudden and temporary (transient) loss of memory (amnesia). While you may recall memories from your distant past, including being able to recognize people you know well, you may not recall things that happened more recently in the past days, months, or even year. A transient global amnesia episode does not last longer than 24 hours.  Transient global amnesia does not affect your other brain functions. Your memory usually returns to normal after an episode is over. One episode of transient global amnesia does not make you more likely to have a stroke, a relapse, or other complications.  CAUSES  The cause of this condition is not known.  RISK FACTORS  Transient global amnesia is more likely to develop in people who:  Are 59-43 years old.  Have a history of migraine headaches. SYMPTOMS  The main symptoms of this condition include:  The inability to remember recent events.  Asking repetitive questions about the situation and surroundings and not recalling the answers to these questions. Other symptoms include:  Restlessness and nervousness.  Confusion.  Headaches.  Dizziness.  Nausea. DIAGNOSIS  Your health care provider may suspect transient global amnesia based on your symptoms. Your health care provider will do a physical exam. This may include a test to check your mental abilities (cognitive evaluation). You may also have imaging studies done to check your brain function. These may include:   Electroencephalography (EEG).  Diffusion-weighted imaging (DWI).  MRI. TREATMENT  There is no treatment for this condition. An episode typically goes away on its own after a few hours. If you also have a seizure or migraine during an episode, you will receive treatment for these conditions. This may include medicines. HOME CARE INSTRUCTIONS  Take medicines only as directed by your health care provider.  Tell your family or  friends that you have transient global amnesia. Ask them to help you avoid physical exertion, including sexual intercourse, swimming, and straining while holding your breath (Valsalva maneuver), until the episode passes. These are events that can bring on transient global amnesia attacks. SEEK MEDICAL CARE IF:   You have a migraine and it does not go away after you have followed your treatment plan for this condition.  You have a seizure for the first time, or a seizure that is different from seizures you normally have.  You experience transient global amnesia repeatedly.   This information is not intended to replace advice given to you by your health care provider. Make sure you discuss any questions you have with your health care provider.   Document Released: 11/27/2004 Document Revised: 07/11/2015 Document Reviewed: 07/05/2014 Elsevier Interactive Patient Education [employer].

## 2016-06-12 NOTE — Assessment & Plan Note (Signed)
Stable con't meds 

## 2016-06-12 NOTE — Assessment & Plan Note (Signed)
Check labs Cont' meds 

## 2016-06-12 NOTE — Progress Notes (Signed)
Pre visit review using our clinic review tool, if applicable. No additional management support is needed unless otherwise documented below in the visit note. 

## 2016-06-12 NOTE — Assessment & Plan Note (Signed)
Symptoms resolved  But pt still does not remember incident No residual symptoms

## 2016-06-12 NOTE — Progress Notes (Signed)
Patient ID: Robin Wall, female    DOB: Jul 01, 1946  Age: 70 y.o. MRN: [id number]    Subjective:  Subjective  HPI Robin Wall presents for f/u from hosp for transient global amnesia--- 7/31-8/1.  Pt c/o dull headache since being d/c.  She still can not remember what happened.  She had been working in the yard and became disoriented.  Called her daughter confused and her husband took her to the ER.  She remembers "waking up" in the ER but remembers nothing else from the time her daughter called until ER.    Review of Systems  Constitutional: Negative for appetite change, diaphoresis, fatigue and unexpected weight change.  Eyes: Negative for pain, redness and visual disturbance.  Respiratory: Negative for cough, chest tightness, shortness of breath and wheezing.   Cardiovascular: Negative for chest pain, palpitations and leg swelling.  Endocrine: Negative for cold intolerance, heat intolerance, polydipsia, polyphagia and polyuria.  Genitourinary: Negative for difficulty urinating, dysuria and frequency.  Neurological: Negative for dizziness, light-headedness, numbness and headaches.    History Past Medical History:  Diagnosis Date  . Allergy   . GERD (gastroesophageal reflux disease)   . Hyperlipidemia   . Hypertension     She has a past surgical history that includes Cataract extraction.   Her family history includes Breast cancer (age of onset: 56) in her paternal grandmother; Diabetes in her father; Heart disease in her father; Hyperlipidemia in her father and mother; Hypertension in her father and mother.She reports that she has never smoked. She has never used smokeless tobacco. She reports that she does not drink alcohol or use drugs.  Current Outpatient Prescriptions on File Prior to Visit  Medication Sig Dispense Refill  . buPROPion (WELLBUTRIN XL) 300 MG 24 hr tablet Take 1 tablet (300 mg total) by mouth at bedtime.    Marland Kitchen ibuprofen (ADVIL,MOTRIN) 200 MG tablet Take 400 mg  by mouth at bedtime as needed (pain/ sleep).    . Multiple Vitamin (MULTIVITAMIN WITH MINERALS) TABS tablet Take 1 tablet by mouth at bedtime.    Marland Kitchen omeprazole (PRILOSEC) 20 MG capsule TAKE 1 CAPSULE (20 MG TOTAL) BY MOUTH DAILY AT BEDTIME 1 capsule 0  . simvastatin (ZOCOR) 40 MG tablet TAKE 1 TABLET BY MOUTH EVERY DAY AT BEDTIME 90 tablet 1  . valsartan-hydrochlorothiazide (DIOVAN-HCT) 80-12.5 MG tablet Take 1 tablet by mouth at bedtime.     No current facility-administered medications on file prior to visit.      Objective:  Objective  Physical Exam  Constitutional: She is oriented to person, place, and time. She appears well-developed and well-nourished.  HENT:  Head: Normocephalic and atraumatic.  Eyes: Conjunctivae and EOM are normal.  Neck: Normal range of motion. Neck supple. No JVD present. Carotid bruit is not present. No thyromegaly present.  Cardiovascular: Normal rate, regular rhythm and normal heart sounds.   No murmur heard. Pulmonary/Chest: Effort normal and breath sounds normal. No respiratory distress. She has no wheezes. She has no rales. She exhibits no tenderness.  Musculoskeletal: She exhibits no edema.  Neurological: She is alert and oriented to person, place, and time. She has normal reflexes. She displays normal reflexes. No cranial nerve deficit. She exhibits normal muscle tone. Coordination normal.  Psychiatric: She has a normal mood and affect. Her behavior is normal. Judgment and thought content normal.  Nursing note and vitals reviewed.  BP 122/78 (BP Location: Left Arm, Patient Position: Sitting, Cuff Size: Normal)   Pulse 89   Temp 98.1  F (36.7 C) (Oral)   Wt 199 lb (90.3 kg)   LMP  (LMP Unknown)   SpO2 97%   BMI 32.12 kg/m  Wt Readings from Last 3 Encounters:  06/12/16 199 lb (90.3 kg)  06/02/16 202 lb 2.6 oz (91.7 kg)  11/08/15 203 lb 3.2 oz (92.2 kg)     Lab Results  Component Value Date   WBC 5.1 06/03/2016   HGB 11.0 (L) 06/03/2016    HCT 34.0 (L) 06/03/2016   PLT 209 06/03/2016   GLUCOSE 95 06/12/2016   CHOL 167 06/12/2016   TRIG 105.0 06/12/2016   HDL 64.90 06/12/2016   LDLDIRECT 77.0 11/08/2015   LDLCALC 81 06/12/2016   ALT 24 06/12/2016   AST 20 06/12/2016   NA 137 06/12/2016   K 4.0 06/12/2016   CL 101 06/12/2016   CREATININE 0.81 06/12/2016   BUN 14 06/12/2016   CO2 30 06/12/2016   TSH 1.26 11/08/2015   INR 0.89 06/02/2016    No results found.   Assessment & Plan:  Plan  I am having Ms. Freund maintain her simvastatin, multivitamin with minerals, ibuprofen, buPROPion, omeprazole, valsartan-hydrochlorothiazide, levocetirizine, and aspirin.  Meds ordered this encounter  Medications  . levocetirizine (XYZAL) 5 MG tablet    Sig: TAKE 1 TABLET (5 MG TOTAL) BY MOUTH DAILY.    Refill:  1  . aspirin 81 MG tablet    Sig: Take 81 mg by mouth daily.    Problem List Items Addressed This Visit      Unprioritized   Essential hypertension    Stable con't meds      Relevant Medications   aspirin 81 MG tablet   Other Relevant Orders   POCT urinalysis dipstick (Completed)   Comprehensive metabolic panel (Completed)   Hyperlipidemia    Check labs  Con;t meds       Relevant Medications   aspirin 81 MG tablet   Other Relevant Orders   Lipid panel (Completed)   Comprehensive metabolic panel (Completed)   Transient global amnesia - Primary    Symptoms resolved  But pt still does not remember incident No residual symptoms       Other Visit Diagnoses    Leukocytes in urine       Relevant Orders   Urine culture      Follow-up: Return in about 6 months (around 12/13/2016) for hypertension, hyperlipidemia.  Ann Held, DO

## 2016-06-13 LAB — URINE CULTURE: ORGANISM ID, BACTERIA: NO GROWTH

## 2016-06-24 ENCOUNTER — Ambulatory Visit (AMBULATORY_SURGERY_CENTER): Payer: Self-pay

## 2016-06-24 ENCOUNTER — Telehealth: Payer: Self-pay

## 2016-06-24 VITALS — Ht 66.0 in | Wt 198.6 lb

## 2016-06-24 DIAGNOSIS — Z1211 Encounter for screening for malignant neoplasm of colon: Secondary | ICD-10-CM

## 2016-06-24 NOTE — Progress Notes (Signed)
Per pt, no allergies to soy or egg products.Pt not taking any weight loss meds or using  O2 at home. 

## 2016-06-24 NOTE — Telephone Encounter (Signed)
Yes I think that would be a good idea to see her in clinic. Thanks, please cancel colonoscopy

## 2016-06-24 NOTE — Telephone Encounter (Signed)
Dr Tilda Burrow Robin Wall is scheduled for a colonoscopy with you on 07/08/16.She was seen in the ED on 06/02/16 due to transient global amnesia.Per Robin Wall, all tests were negative?Does the Robin Wall need an office visit or is a direct colon OK? Please advise

## 2016-06-24 NOTE — Telephone Encounter (Signed)
Called pt and left message we would need to cancel the colon on 07/08/16. Pt needs to schedule an office visit per Dr Havery Moros.

## 2016-06-25 ENCOUNTER — Encounter: Payer: Self-pay | Admitting: Gastroenterology

## 2016-06-28 ENCOUNTER — Other Ambulatory Visit: Payer: Self-pay | Admitting: Family Medicine

## 2016-06-30 NOTE — Telephone Encounter (Signed)
Pt is rescheduled for an office visit on 09/04/16 at 9 am with Dr Havery Moros , to discuss the colonoscopy.

## 2016-07-08 ENCOUNTER — Encounter: Payer: Federal, State, Local not specified - PPO | Admitting: Gastroenterology

## 2016-07-14 ENCOUNTER — Other Ambulatory Visit: Payer: Self-pay | Admitting: Family Medicine

## 2016-08-04 ENCOUNTER — Other Ambulatory Visit: Payer: Self-pay

## 2016-08-04 MED ORDER — BUPROPION HCL ER (XL) 300 MG PO TB24: 300.0000 mg | ORAL_TABLET | Freq: Every day | ORAL | 1 refills | Status: DC

## 2016-09-04 ENCOUNTER — Ambulatory Visit (INDEPENDENT_AMBULATORY_CARE_PROVIDER_SITE_OTHER): Payer: Federal, State, Local not specified - PPO | Admitting: Gastroenterology

## 2016-09-04 ENCOUNTER — Encounter: Payer: Self-pay | Admitting: Gastroenterology

## 2016-09-04 VITALS — BP 150/80 | HR 84 | Ht 65.35 in | Wt 203.2 lb

## 2016-09-04 DIAGNOSIS — Z1211 Encounter for screening for malignant neoplasm of colon: Secondary | ICD-10-CM

## 2016-09-04 DIAGNOSIS — K219 Gastro-esophageal reflux disease without esophagitis: Secondary | ICD-10-CM | POA: Diagnosis not present

## 2016-09-04 MED ORDER — NA SULFATE-K SULFATE-MG SULF 17.5-3.13-1.6 GM/177ML PO SOLN: 1.0000 | Freq: Once | ORAL | 0 refills | Status: AC

## 2016-09-04 MED ORDER — RANITIDINE HCL 150 MG PO TABS: 150.0000 mg | ORAL_TABLET | Freq: Every day | ORAL | 3 refills | Status: DC

## 2016-09-04 NOTE — Progress Notes (Signed)
HPI :  70 y/o female with a history of ischemic colitis, transient global amnesia , and reflux , here to discuss colon cancer screening. She has not been seen in several years and our clinic  Last colonoscopy was in 2005. She had ischemic colitis at the time, first and only time, unclear cause per patient.  No trouble with bowels. No blood in the stools. No abdominal pains. No weight changes / weight loss. Otherwise doing well. No FH of colon cancer.   She was admitted in August 2017 for transient global amnesia, MRI head and EEG unrevealing. Patient thought she was working in the yard too much in the heat which was related. She has not had any recurrence of those symptoms. No further evaluation was warranted per Neurology. She has not had any recurrence of this since admission.  She takes omeprazole 20mg  for heartburn. She takes it daily. She has been on this for a few years. No dysphagia. No FH of esophageal cancer. No prior tobacco use. She thinks she may have been on zantac a long time ago, she thinks it helped.   EGD 12/2003 - normal Colonoscopy Jan 2005 - left sided ischemic colitis - path shows ddx of ischemic vs. Infectious changes  Past Medical History:  Diagnosis Date  . Allergy   . Colitis    history of  . GERD (gastroesophageal reflux disease)   . Hyperlipidemia   . Hypertension   . TGA (transient global amnesia)    05/2016     Past Surgical History:  Procedure Laterality Date  . CATARACT EXTRACTION Bilateral   . TONSILLECTOMY     Family History  Problem Relation Age of Onset  . Hypertension Mother   . Hyperlipidemia Mother   . Diabetes Father   . Heart disease Father     chf  . Hypertension Father   . Hyperlipidemia Father   . Coronary artery disease    . Breast cancer Paternal Grandmother 59   Social History  Substance Use Topics  . Smoking status: Never Smoker  . Smokeless tobacco: Never Used  . Alcohol use No   Current Outpatient Prescriptions    Medication Sig Dispense Refill  . aspirin 81 MG tablet Take 81 mg by mouth daily.    Marland Kitchen buPROPion (WELLBUTRIN XL) 300 MG 24 hr tablet Take 1 tablet (300 mg total) by mouth daily. 90 tablet 1  . fluticasone (FLONASE) 50 MCG/ACT nasal spray Place 2 sprays into both nostrils as needed for allergies or rhinitis.    Marland Kitchen ibuprofen (ADVIL,MOTRIN) 200 MG tablet Take 400 mg by mouth at bedtime as needed (pain/ sleep).    Marland Kitchen levocetirizine (XYZAL) 5 MG tablet TAKE 1 TABLET (5 MG TOTAL) BY MOUTH DAILY. 90 tablet 1  . Multiple Vitamin (MULTIVITAMIN WITH MINERALS) TABS tablet Take 1 tablet by mouth at bedtime.    Marland Kitchen omeprazole (PRILOSEC) 20 MG capsule TAKE 1 CAPSULE (20 MG TOTAL) BY MOUTH DAILY AT BEDTIME 1 capsule 0  . simvastatin (ZOCOR) 40 MG tablet TAKE 1 TABLET BY MOUTH EVERY DAY AT BEDTIME 90 tablet 1  . valsartan-hydrochlorothiazide (DIOVAN-HCT) 80-12.5 MG tablet Take 1 tablet by mouth at bedtime.     No current facility-administered medications for this visit.    No Known Allergies   Review of Systems: All systems reviewed and negative except where noted in HPI.   Lab Results  Component Value Date   CREATININE 0.81 06/12/2016   BUN 14 06/12/2016   NA 137 06/12/2016  K 4.0 06/12/2016   CL 101 06/12/2016   CO2 30 06/12/2016    Lab Results  Component Value Date   ALT 24 06/12/2016   AST 20 06/12/2016   ALKPHOS 64 06/12/2016   BILITOT 0.4 06/12/2016        Physical Exam: BP (!) 150/80 (BP Location: Left Arm, Patient Position: Sitting, Cuff Size: Normal)   Pulse 84   Ht 5' 5.35" (1.66 m) Comment: height measured without shoes  Wt 203 lb 4 oz (92.2 kg)   LMP  (LMP Unknown)   BMI 33.46 kg/m  Constitutional: Pleasant,well-developed, female in no acute distress. HEENT: Normocephalic and atraumatic. Conjunctivae are normal. No scleral icterus. Neck supple.  Cardiovascular: Normal rate, regular rhythm.  Pulmonary/chest: Effort normal and breath sounds normal. No wheezing, rales or  rhonchi. Abdominal: Soft, nondistended, nontender. There are no masses palpable. No hepatomegaly. Extremities: no edema Lymphadenopathy: No cervical adenopathy noted. Neurological: Alert and oriented to person place and time. Skin: Skin is warm and dry. No rashes noted. Psychiatric: Normal mood and affect. Behavior is normal.   ASSESSMENT AND PLAN: 70 year old female with history as outlined above, here for evaluation of the following issues:  Colon cancer screening - patient is overdue for colon cancer screening. She is otherwise asymptomatic. I discussed colon cancer screening options with her and recommend optical colonoscopy. She does have a history of transient global amnesia this past August which has since resolved without recurrence. I discussed risks benefits of anesthesia and colonoscopy with her and she wanted to proceed, referred to scheduler.  GERD - history of EGD previously without Barrett's esophagus, do not think she warrants further EGD at this time. Symptoms well controlled with low-dose PPI. Discuss long-term risks benefits of long-term PPI with patient and given that her symptoms are not bothering her recommend we transition to Zantac. I discussed how to taper off omeprazole and she will eventually transition to Zantac 150 mg 1-2 tablets daily. She can follow-up as needed for this issue  [name], MD [facility] Pager [phone]  CC: Carollee Herter, Alferd Apa, *

## 2016-09-04 NOTE — Patient Instructions (Signed)
If you are age 70 or older, your body mass index should be between 23-30. Your Body mass index is 33.46 kg/m. If this is out of the aforementioned range listed, please consider follow up with your Primary Care Provider.  If you are age 67 or younger, your body mass index should be between 19-25. Your Body mass index is 33.46 kg/m. If this is out of the aformentioned range listed, please consider follow up with your Primary Care Provider.   We have sent your demographic and insurance information to [employer]. They should contact you within the next week regarding your Cologuard (colon cancer screening) test. If you have not heard from them within the next week, please call our office at [phone].  We have sent the following medications to your pharmacy for you to pick up at your convenience: Suprep.

## 2016-09-08 ENCOUNTER — Encounter: Payer: Self-pay | Admitting: Gastroenterology

## 2016-09-08 ENCOUNTER — Ambulatory Visit (AMBULATORY_SURGERY_CENTER): Payer: Federal, State, Local not specified - PPO | Admitting: Gastroenterology

## 2016-09-08 VITALS — BP 121/62 | HR 72 | Temp 98.9°F | Resp 10 | Ht 66.0 in | Wt 198.0 lb

## 2016-09-08 DIAGNOSIS — Z1211 Encounter for screening for malignant neoplasm of colon: Secondary | ICD-10-CM

## 2016-09-08 DIAGNOSIS — K635 Polyp of colon: Secondary | ICD-10-CM | POA: Diagnosis not present

## 2016-09-08 DIAGNOSIS — Z1212 Encounter for screening for malignant neoplasm of rectum: Secondary | ICD-10-CM

## 2016-09-08 DIAGNOSIS — D125 Benign neoplasm of sigmoid colon: Secondary | ICD-10-CM

## 2016-09-08 MED ORDER — SODIUM CHLORIDE 0.9 % IV SOLN: 500.0000 mL | INTRAVENOUS | Status: AC

## 2016-09-08 NOTE — Progress Notes (Signed)
Called to room to assist during endoscopic procedure.  Patient ID and intended procedure confirmed with present staff. Received instructions for my participation in the procedure from the performing physician.  

## 2016-09-08 NOTE — Op Note (Signed)
[place] Patient Name: Robin Wall Procedure Date: 09/08/2016 9:47 AM MRN: [id number] Endoscopist: Remo Lipps P. Kerston Landeck MD, MD Age: 70 Referring MD:  Date of Birth: [birth year] Gender: Female Account #: [id number] Procedure:                Colonoscopy Indications:              Screening for malignant neoplasm in the colon Medicines:                Monitored Anesthesia Care Procedure:                Pre-Anesthesia Assessment:                           - Prior to the procedure, a History and Physical                            was performed, and patient medications and                            allergies were reviewed. The patient's tolerance of                            previous anesthesia was also reviewed. The risks                            and benefits of the procedure and the sedation                            options and risks were discussed with the patient.                            All questions were answered, and informed consent                            was obtained. Prior Anticoagulants: The patient has                            taken aspirin, last dose was 1 day prior to                            procedure. ASA Grade Assessment: II - A patient                            with mild systemic disease. After reviewing the                            risks and benefits, the patient was deemed in                            satisfactory condition to undergo the procedure.                           After obtaining informed consent, the colonoscope  was passed under direct vision. Throughout the                            procedure, the patient's blood pressure, pulse, and                            oxygen saturations were monitored continuously. The                            Model CF-HQ190L [id number]) scope was introduced                            through the anus and advanced to the the cecum,                            identified  by appendiceal orifice and ileocecal                            valve. The colonoscopy was performed without                            difficulty. The patient tolerated the procedure                            well. The quality of the bowel preparation was                            good. The ileocecal valve, appendiceal orifice, and                            rectum were photographed. Scope In: 9:49:17 AM Scope Out: 10:05:22 AM Scope Withdrawal Time: 0 hours 12 minutes 9 seconds  Total Procedure Duration: 0 hours 16 minutes 5 seconds  Findings:                 The perianal and digital rectal examinations were                            normal.                           A few small-mouthed diverticula were found in the                            sigmoid colon.                           A 5 mm polyp was found in the sigmoid colon. The                            polyp was sessile. The polyp was removed with a                            cold snare. Resection and retrieval were complete.  Anal papilla(e) were hypertrophied.                           The exam was otherwise without abnormality. Complications:            No immediate complications. Estimated blood loss:                            Minimal. Estimated Blood Loss:     Estimated blood loss was minimal. Impression:               - Diverticulosis in the sigmoid colon.                           - One 5 mm polyp in the sigmoid colon, removed with                            a cold snare. Resected and retrieved.                           - Anal papilla(e) were hypertrophied.                           - The examination was otherwise normal. Recommendation:           - Patient has a contact number available for                            emergencies. The signs and symptoms of potential                            delayed complications were discussed with the                            patient. Return to normal  activities tomorrow.                            Written discharge instructions were provided to the                            patient.                           - Resume previous diet.                           - Continue present medications.                           - No ibuprofen, naproxen, or other non-steroidal                            anti-inflammatory drugs for 2 weeks after polyp                            removal.                           -  Await pathology results.                           - Repeat colonoscopy is recommended for                            surveillance. The colonoscopy date will be                            determined after pathology results from today's                            exam become available for review. Remo Lipps P. Callen Vancuren MD, MD 09/08/2016 [id number] AM This report has been signed electronically.

## 2016-09-08 NOTE — Progress Notes (Signed)
To recovery, report to Sechler, RN, VSS 

## 2016-09-08 NOTE — Patient Instructions (Signed)
Impression/Recommendations:  Diverticulosis handout given to patient. Polyp handout given to patient.  No ibuporfen, naproxen, or other NSAIDS drugs for 2 weeks.  Repeat colonoscopy recommended for surveillance based on pathology results.  YOU HAD AN ENDOSCOPIC PROCEDURE TODAY AT [facility]:   Refer to the procedure report that was given to you for any specific questions about what was found during the examination.  If the procedure report does not answer your questions, please call your gastroenterologist to clarify.  If you requested that your care partner not be given the details of your procedure findings, then the procedure report has been included in a sealed envelope for you to review at your convenience later.  YOU SHOULD EXPECT: Some feelings of bloating in the abdomen. Passage of more gas than usual.  Walking can help get rid of the air that was put into your GI tract during the procedure and reduce the bloating. If you had a lower endoscopy (such as a colonoscopy or flexible sigmoidoscopy) you may notice spotting of blood in your stool or on the toilet paper. If you underwent a bowel prep for your procedure, you may not have a normal bowel movement for a few days.  Please Note:  You might notice some irritation and congestion in your nose or some drainage.  This is from the oxygen used during your procedure.  There is no need for concern and it should clear up in a day or so.  SYMPTOMS TO REPORT IMMEDIATELY:   Following lower endoscopy (colonoscopy or flexible sigmoidoscopy):  Excessive amounts of blood in the stool  Significant tenderness or worsening of abdominal pains  Swelling of the abdomen that is new, acute  Fever of 100F or higher For urgent or emergent issues, a gastroenterologist can be reached at any hour by calling [phone].   DIET:  We do recommend a small meal at first, but then you may proceed to your regular diet.  Drink plenty of fluids  but you should avoid alcoholic beverages for 24 hours.  ACTIVITY:  You should plan to take it easy for the rest of today and you should NOT DRIVE or use heavy machinery until tomorrow (because of the sedation medicines used during the test).    FOLLOW UP: Our staff will call the number listed on your records the next business day following your procedure to check on you and address any questions or concerns that you may have regarding the information given to you following your procedure. If we do not reach you, we will leave a message.  However, if you are feeling well and you are not experiencing any problems, there is no need to return our call.  We will assume that you have returned to your regular daily activities without incident.  If any biopsies were taken you will be contacted by phone or by letter within the next 1-3 weeks.  Please call us at [phone] if you have not heard about the biopsies in 3 weeks.    SIGNATURES/CONFIDENTIALITY: You and/or your care partner have signed paperwork which will be entered into your electronic medical record.  These signatures attest to the fact that that the information above on your After Visit Summary has been reviewed and is understood.  Full responsibility of the confidentiality of this discharge information lies with you and/or your care-partner.

## 2016-09-09 ENCOUNTER — Telehealth: Payer: Self-pay

## 2016-09-09 ENCOUNTER — Telehealth: Payer: Self-pay | Admitting: *Deleted

## 2016-09-09 NOTE — Telephone Encounter (Signed)
  Follow up Call-  Call back number 09/08/2016  Post procedure Call Back phone  # [phone]  Permission to leave phone message Yes  Some recent data might be hidden     Patient questions:  Do you have a fever, pain , or abdominal swelling? No. Pain Score  0 *  Have you tolerated food without any problems? Yes.    Have you been able to return to your normal activities? Yes.    Do you have any questions about your discharge instructions: Diet   No. Medications  No. Follow up visit  No.  Do you have questions or concerns about your Care? No.  Actions: * If pain score is 4 or above: No action needed, pain <4.

## 2016-09-09 NOTE — Telephone Encounter (Signed)
  Follow up Call-  Call back number 09/08/2016  Post procedure Call Back phone  # [phone]  Permission to leave phone message Yes  Some recent data might be hidden     Patient questions:  Do you have a fever, pain , or abdominal swelling? No. Pain Score  0 *  Have you tolerated food without any problems? Yes.    Have you been able to return to your normal activities? Yes.    Do you have any questions about your discharge instructions: Diet   No. Medications  No. Follow up visit  No.  Do you have questions or concerns about your Care? No.  Actions: * If pain score is 4 or above: No action needed, pain <4.

## 2016-09-11 ENCOUNTER — Encounter: Payer: Self-pay | Admitting: Gastroenterology

## 2016-10-10 ENCOUNTER — Other Ambulatory Visit: Payer: Self-pay | Admitting: Gastroenterology

## 2016-10-10 ENCOUNTER — Other Ambulatory Visit: Payer: Self-pay

## 2016-10-10 MED ORDER — RANITIDINE HCL 150 MG PO TABS
150.0000 mg | ORAL_TABLET | Freq: Every day | ORAL | 3 refills | Status: DC
Start: 2016-10-10 — End: 2017-05-28

## 2016-10-23 ENCOUNTER — Other Ambulatory Visit: Payer: Self-pay | Admitting: Family

## 2016-10-24 ENCOUNTER — Other Ambulatory Visit: Payer: Self-pay | Admitting: Family

## 2016-11-03 ENCOUNTER — Other Ambulatory Visit: Payer: Self-pay | Admitting: Family Medicine

## 2016-11-07 ENCOUNTER — Other Ambulatory Visit: Payer: Self-pay | Admitting: Family Medicine

## 2016-11-07 DIAGNOSIS — Z1231 Encounter for screening mammogram for malignant neoplasm of breast: Secondary | ICD-10-CM

## 2016-11-10 ENCOUNTER — Other Ambulatory Visit: Payer: Self-pay | Admitting: Family Medicine

## 2016-11-24 ENCOUNTER — Other Ambulatory Visit: Payer: Self-pay | Admitting: Family Medicine

## 2016-11-25 ENCOUNTER — Ambulatory Visit
Admission: RE | Admit: 2016-11-25 | Discharge: 2016-11-25 | Disposition: A | Discharge status: Home / Self Care | Payer: Federal, State, Local not specified - PPO | Source: Ambulatory Visit | Attending: Family Medicine | Admitting: Family Medicine

## 2016-11-25 DIAGNOSIS — Z1231 Encounter for screening mammogram for malignant neoplasm of breast: Secondary | ICD-10-CM

## 2016-11-28 ENCOUNTER — Encounter: Payer: Self-pay | Admitting: Family Medicine

## 2016-11-28 ENCOUNTER — Ambulatory Visit (INDEPENDENT_AMBULATORY_CARE_PROVIDER_SITE_OTHER): Payer: Federal, State, Local not specified - PPO | Admitting: Family Medicine

## 2016-11-28 VITALS — BP 130/78 | HR 77 | Temp 98.1°F | Resp 16 | Ht 66.0 in | Wt 205.4 lb

## 2016-11-28 DIAGNOSIS — Z0000 Encounter for general adult medical examination without abnormal findings: Secondary | ICD-10-CM

## 2016-11-28 DIAGNOSIS — E785 Hyperlipidemia, unspecified: Secondary | ICD-10-CM | POA: Diagnosis not present

## 2016-11-28 DIAGNOSIS — Z23 Encounter for immunization: Secondary | ICD-10-CM

## 2016-11-28 DIAGNOSIS — I10 Essential (primary) hypertension: Secondary | ICD-10-CM | POA: Diagnosis not present

## 2016-11-28 DIAGNOSIS — Z299 Encounter for prophylactic measures, unspecified: Secondary | ICD-10-CM

## 2016-11-28 LAB — COMPREHENSIVE METABOLIC PANEL
ALK PHOS: 64 U/L (ref 39–117)
ALT: 22 U/L (ref 0–35)
AST: 19 U/L (ref 0–37)
Albumin: 4.4 g/dL (ref 3.5–5.2)
BUN: 15 mg/dL (ref 6–23)
CO2: 30 meq/L (ref 19–32)
Calcium: 9.3 mg/dL (ref 8.4–10.5)
Chloride: 103 mEq/L (ref 96–112)
Creatinine, Ser: 0.73 mg/dL (ref 0.40–1.20)
GFR: 83.67 mL/min (ref >60.00)
Glucose, Bld: 106 mg/dL — ABNORMAL HIGH (ref 70–99)
POTASSIUM: 3.9 meq/L (ref 3.5–5.1)
SODIUM: 139 meq/L (ref 135–145)
TOTAL PROTEIN: 7 g/dL (ref 6.0–8.3)
Total Bilirubin: 0.4 mg/dL (ref 0.2–1.2)

## 2016-11-28 LAB — POC URINALSYSI DIPSTICK (AUTOMATED)
BILIRUBIN UA: NEGATIVE
Blood, UA: NEGATIVE
GLUCOSE UA: NEGATIVE
KETONES UA: NEGATIVE
LEUKOCYTES UA: NEGATIVE
NITRITE UA: NEGATIVE
Protein, UA: NEGATIVE
SPEC GRAV UA: 1.025
UROBILINOGEN UA: NEGATIVE
pH, UA: 6

## 2016-11-28 LAB — LIPID PANEL
CHOL/HDL RATIO: 2
Cholesterol: 166 mg/dL (ref 0–200)
HDL: 66.6 mg/dL (ref >39.00)
LDL Cholesterol: 84 mg/dL (ref 0–99)
NONHDL: 99.34
Triglycerides: 77 mg/dL (ref 0.0–149.0)
VLDL: 15.4 mg/dL (ref 0.0–40.0)

## 2016-11-28 LAB — CBC
HEMATOCRIT: 35.4 % — AB (ref 36.0–46.0)
HEMOGLOBIN: 12.1 g/dL (ref 12.0–15.0)
MCHC: 34.2 g/dL (ref 30.0–36.0)
MCV: 83.2 fl (ref 78.0–100.0)
PLATELETS: 239 10*3/uL (ref 150.0–400.0)
RBC: 4.26 Mil/uL (ref 3.87–5.11)
RDW: 14.2 % (ref 11.5–15.5)
WBC: 4.8 10*3/uL (ref 4.0–10.5)

## 2016-11-28 NOTE — Progress Notes (Signed)
Pre visit review using our clinic review tool, if applicable. No additional management support is needed unless otherwise documented below in the visit note. 

## 2016-11-28 NOTE — Patient Instructions (Signed)
Preventive Care 65 Years and Older, Female Preventive care refers to lifestyle choices and visits with your health care provider that can promote health and wellness. What does preventive care include?  A yearly physical exam. This is also called an annual well check.  Dental exams once or twice a year.  Routine eye exams. Ask your health care provider how often you should have your eyes checked.  Personal lifestyle choices, including:  Daily care of your teeth and gums.  Regular physical activity.  Eating a healthy diet.  Avoiding tobacco and drug use.  Limiting alcohol use.  Practicing safe sex.  Taking low-dose aspirin every day.  Taking vitamin and mineral supplements as recommended by your health care provider. What happens during an annual well check? The services and screenings done by your health care provider during your annual well check will depend on your age, overall health, lifestyle risk factors, and family history of disease. Counseling  Your health care provider may ask you questions about your:  Alcohol use.  Tobacco use.  Drug use.  Emotional well-being.  Home and relationship well-being.  Sexual activity.  Eating habits.  History of falls.  Memory and ability to understand (cognition).  Work and work environment.  Reproductive health. Screening  You may have the following tests or measurements:  Height, weight, and BMI.  Blood pressure.  Lipid and cholesterol levels. These may be checked every 5 years, or more frequently if you are over 50 years old.  Skin check.  Lung cancer screening. You may have this screening every year starting at age 55 if you have a 30-pack-year history of smoking and currently smoke or have quit within the past 15 years.  Fecal occult blood test (FOBT) of the stool. You may have this test every year starting at age 50.  Flexible sigmoidoscopy or colonoscopy. You may have a sigmoidoscopy every 5 years or  a colonoscopy every 10 years starting at age 50.  Hepatitis C blood test.  Hepatitis B blood test.  Sexually transmitted disease (STD) testing.  Diabetes screening. This is done by checking your blood sugar (glucose) after you have not eaten for a while (fasting). You may have this done every 1-3 years.  Bone density scan. This is done to screen for osteoporosis. You may have this done starting at age 65.  Mammogram. This may be done every 1-2 years. Talk to your health care provider about how often you should have regular mammograms. Talk with your health care provider about your test results, treatment options, and if necessary, the need for more tests. Vaccines  Your health care provider may recommend certain vaccines, such as:  Influenza vaccine. This is recommended every year.  Tetanus, diphtheria, and acellular pertussis (Tdap, Td) vaccine. You may need a Td booster every 10 years.  Varicella vaccine. You may need this if you have not been vaccinated.  Zoster vaccine. You may need this after age 60.  Measles, mumps, and rubella (MMR) vaccine. You may need at least one dose of MMR if you were born in 1957 or later. You may also need a second dose.  Pneumococcal 13-valent conjugate (PCV13) vaccine. One dose is recommended after age 65.  Pneumococcal polysaccharide (PPSV23) vaccine. One dose is recommended after age 65.  Meningococcal vaccine. You may need this if you have certain conditions.  Hepatitis A vaccine. You may need this if you have certain conditions or if you travel or work in places where you may be exposed to   hepatitis A.  Hepatitis B vaccine. You may need this if you have certain conditions or if you travel or work in places where you may be exposed to hepatitis B.  Haemophilus influenzae type b (Hib) vaccine. You may need this if you have certain conditions. Talk to your health care provider about which screenings and vaccines you need and how often you need  them. This information is not intended to replace advice given to you by your health care provider. Make sure you discuss any questions you have with your health care provider. Document Released: 11/16/2015 Document Revised: 07/09/2016 Document Reviewed: 08/21/2015 Elsevier Interactive Patient Education  2017 Elsevier Inc.  

## 2016-11-28 NOTE — Progress Notes (Signed)
Patient ID: Robin Wall, female    DOB: 10-20-1946  Age: 71 y.o. MRN: [id number]    Subjective:  Subjective  HPI Robin Wall presents for annual physical.  No complaints  Review of Systems  Constitutional: Negative.   HENT: Negative.   Eyes: Negative.   Respiratory: Negative.   Cardiovascular: Negative.   Gastrointestinal: Negative.   Endocrine: Negative.   Genitourinary: Negative.   Musculoskeletal: Negative.   Skin: Negative.   Allergic/Immunologic: Negative.   Neurological: Negative.   Hematological: Negative.     History Past Medical History:  Diagnosis Date  . Allergy   . Colitis    history of  . GERD (gastroesophageal reflux disease)   . Hyperlipidemia   . Hypertension   . TGA (transient global amnesia)    05/2016    She has a past surgical history that includes Cataract extraction (Bilateral) and Tonsillectomy.   Her family history includes Breast cancer (age of onset: 83) in her paternal grandmother; Diabetes in her father; Heart disease in her father; Hyperlipidemia in her father and mother; Hypertension in her father and mother.She reports that she has never smoked. She has never used smokeless tobacco. She reports that she does not drink alcohol or use drugs.  Current Outpatient Prescriptions on File Prior to Visit  Medication Sig Dispense Refill  . aspirin 81 MG tablet Take 81 mg by mouth daily.    Marland Kitchen buPROPion (WELLBUTRIN XL) 300 MG 24 hr tablet Take 1 tablet (300 mg total) by mouth daily. 90 tablet 1  . fluticasone (FLONASE) 50 MCG/ACT nasal spray Place 2 sprays into both nostrils as needed for allergies or rhinitis.    Marland Kitchen ibuprofen (ADVIL,MOTRIN) 200 MG tablet Take 400 mg by mouth at bedtime as needed (pain/ sleep).    Marland Kitchen levocetirizine (XYZAL) 5 MG tablet TAKE 1 TABLET (5 MG TOTAL) BY MOUTH DAILY. 90 tablet 1  . Multiple Vitamin (MULTIVITAMIN WITH MINERALS) TABS tablet Take 1 tablet by mouth at bedtime.    Marland Kitchen omeprazole (PRILOSEC) 20 MG capsule TAKE  1 CAPSULE (20 MG TOTAL) BY MOUTH DAILY. 90 capsule 3  . ranitidine (ZANTAC) 150 MG tablet Take 1 tablet (150 mg total) by mouth daily. 30 tablet 3  . ranitidine (ZANTAC) 150 MG tablet TAKE 1 TABLET EVERY DAY 30 tablet 1   Current Facility-Administered Medications on File Prior to Visit  Medication Dose Route Frequency Provider Last Rate Last Dose  . 0.9 %  sodium chloride infusion  500 mL Intravenous Continuous Manus Gunning, MD         Objective:  Objective  Physical Exam  Constitutional: She is oriented to person, place, and time. She appears well-developed and well-nourished. No distress.  HENT:  Right Ear: External ear normal.  Left Ear: External ear normal.  Nose: Nose normal.  Mouth/Throat: Oropharynx is clear and moist.  Eyes: EOM are normal. Pupils are equal, round, and reactive to light.  Neck: Normal range of motion. Neck supple.  Cardiovascular: Normal rate, regular rhythm and normal heart sounds.   No murmur heard. Pulmonary/Chest: Effort normal and breath sounds normal. No respiratory distress. She has no wheezes. She has no rales. She exhibits no tenderness.  Neurological: She is alert and oriented to person, place, and time.  Psychiatric: She has a normal mood and affect. Her behavior is normal. Judgment and thought content normal.  Nursing note and vitals reviewed.  BP 130/78 (BP Location: Left Arm, Patient Position: Sitting, Cuff Size: Normal)   Pulse  77   Temp 98.1 F (36.7 C) (Oral)   Resp 16   Ht 5\' 6"  (1.676 m)   Wt 205 lb 6.4 oz (93.2 kg)   LMP  (LMP Unknown)   SpO2 96%   BMI 33.15 kg/m  Wt Readings from Last 3 Encounters:  11/28/16 205 lb 6.4 oz (93.2 kg)  09/08/16 198 lb (89.8 kg)  09/04/16 203 lb 4 oz (92.2 kg)     Lab Results  Component Value Date   WBC 4.8 11/28/2016   HGB 12.1 11/28/2016   HCT 35.4 (L) 11/28/2016   PLT 239.0 11/28/2016   GLUCOSE 106 (H) 11/28/2016   CHOL 166 11/28/2016   TRIG 77.0 11/28/2016   HDL 66.60  11/28/2016   LDLDIRECT 77.0 11/08/2015   LDLCALC 84 11/28/2016   ALT 22 11/28/2016   AST 19 11/28/2016   NA 139 11/28/2016   K 3.9 11/28/2016   CL 103 11/28/2016   CREATININE 0.73 11/28/2016   BUN 15 11/28/2016   CO2 30 11/28/2016   TSH 1.26 11/08/2015   INR 0.89 06/02/2016    Mm Digital Screening Bilateral  Result Date: 11/25/2016 CLINICAL DATA:  Screening. EXAM: DIGITAL SCREENING BILATERAL MAMMOGRAM WITH CAD COMPARISON:  Previous exam(s). ACR Breast Density Category c: The breast tissue is heterogeneously dense, which may obscure small masses. FINDINGS: There are no findings suspicious for malignancy. Images were processed with CAD. IMPRESSION: No mammographic evidence of malignancy. A result letter of this screening mammogram will be mailed directly to the patient. RECOMMENDATION: Screening mammogram in one year. (Code:SM-B-01Y) BI-RADS CATEGORY  1: Negative. Electronically Signed   By: Lovey Newcomer M.D.   On: 11/25/2016 13:18     Assessment & Plan:  Plan  I have changed Robin Wall's simvastatin. I am also having her maintain her multivitamin with minerals, ibuprofen, aspirin, fluticasone, buPROPion, ranitidine, ranitidine, levocetirizine, omeprazole, and valsartan-hydrochlorothiazide. We will continue to administer sodium chloride.  Meds ordered this encounter  Medications  . valsartan-hydrochlorothiazide (DIOVAN-HCT) 80-12.5 MG tablet    Sig: Take 1 tablet by mouth daily.    Dispense:  90 tablet    Refill:  0  . simvastatin (ZOCOR) 40 MG tablet    Sig: Take 1 tablet (40 mg total) by mouth at bedtime.    Dispense:  90 tablet    Refill:  1    Problem List Items Addressed This Visit      Unprioritized   Essential hypertension   Relevant Medications   valsartan-hydrochlorothiazide (DIOVAN-HCT) 80-12.5 MG tablet   simvastatin (ZOCOR) 40 MG tablet   Other Relevant Orders   Comprehensive metabolic panel (Completed)   CBC (Completed)   Hyperlipidemia   Relevant Medications     valsartan-hydrochlorothiazide (DIOVAN-HCT) 80-12.5 MG tablet   simvastatin (ZOCOR) 40 MG tablet   Other Relevant Orders   Lipid panel (Completed)   Preventative health care - Primary    ghm utd Check labs  See AVS      Relevant Orders   POCT Urinalysis Dipstick (Automated) (Completed)    Other Visit Diagnoses    Needs flu shot       Relevant Orders   Flu Vaccine QUAD 36+ mos PF IM (Fluarix & Fluzone Quad PF) (Completed)      Follow-up: Return in about 6 months (around 05/28/2017) for Hypertension.  Ann Held, DO

## 2016-11-30 MED ORDER — VALSARTAN-HYDROCHLOROTHIAZIDE 80-12.5 MG PO TABS: 1.0000 | ORAL_TABLET | Freq: Every day | ORAL | 0 refills | Status: DC

## 2016-11-30 MED ORDER — SIMVASTATIN 40 MG PO TABS: 40.0000 mg | ORAL_TABLET | Freq: Every day | ORAL | 1 refills | Status: DC

## 2016-12-03 DIAGNOSIS — Z23 Encounter for immunization: Secondary | ICD-10-CM | POA: Diagnosis not present

## 2016-12-07 DIAGNOSIS — Z299 Encounter for prophylactic measures, unspecified: Secondary | ICD-10-CM | POA: Insufficient documentation

## 2016-12-07 DIAGNOSIS — Z0000 Encounter for general adult medical examination without abnormal findings: Secondary | ICD-10-CM | POA: Insufficient documentation

## 2016-12-07 NOTE — Assessment & Plan Note (Signed)
ghm utd Check labs See AVS 

## 2016-12-23 ENCOUNTER — Other Ambulatory Visit: Payer: Self-pay | Admitting: Family Medicine

## 2016-12-24 NOTE — Telephone Encounter (Signed)
Received request for Wellbutrin XL 300mg  (take 1 tab po qd)  Last Rf: 11/14/2016 Last Ov: 11/28/2016 Next Ov: 05/28/2017 UDS: None  Forwarded to Provider for review, approval or denial.

## 2017-01-21 ENCOUNTER — Telehealth: Payer: Self-pay | Admitting: *Deleted

## 2017-01-21 DIAGNOSIS — I10 Essential (primary) hypertension: Secondary | ICD-10-CM

## 2017-01-21 MED ORDER — VALSARTAN-HYDROCHLOROTHIAZIDE 80-12.5 MG PO TABS: 1.0000 | ORAL_TABLET | Freq: Every day | ORAL | 1 refills | Status: DC

## 2017-01-21 NOTE — Telephone Encounter (Signed)
Received fax from [facility] requesting 90 day supply of valsartan hctz. Refill sent.

## 2017-04-20 ENCOUNTER — Telehealth: Payer: Self-pay | Admitting: Family Medicine

## 2017-04-20 DIAGNOSIS — E785 Hyperlipidemia, unspecified: Secondary | ICD-10-CM

## 2017-04-20 MED ORDER — SIMVASTATIN 40 MG PO TABS: 40.0000 mg | ORAL_TABLET | Freq: Every day | ORAL | 1 refills | Status: DC

## 2017-04-22 MED ORDER — SIMVASTATIN 40 MG PO TABS: 40.0000 mg | ORAL_TABLET | Freq: Every day | ORAL | 1 refills | Status: DC

## 2017-04-22 NOTE — Telephone Encounter (Addendum)
Relation to GS:UPJS Call back number:[phone] Pharmacy: [id number] - [place], [place] [phone] (Phone) [phone] (Fax)     Reason for call:  Patient checking on the status of simvastatin (ZOCOR) 40 MG tablet refills, please advise

## 2017-04-22 NOTE — Addendum Note (Signed)
Addended by: Sharon Seller B on: 04/22/2017 10:58 AM   Modules accepted: Orders

## 2017-04-22 NOTE — Telephone Encounter (Signed)
Sent in and pt notified.

## 2017-05-28 ENCOUNTER — Encounter: Payer: Self-pay | Admitting: Family Medicine

## 2017-05-28 ENCOUNTER — Ambulatory Visit (INDEPENDENT_AMBULATORY_CARE_PROVIDER_SITE_OTHER): Payer: Federal, State, Local not specified - PPO | Admitting: Family Medicine

## 2017-05-28 VITALS — BP 132/78 | HR 86 | Temp 98.5°F | Resp 16 | Ht 66.0 in | Wt 202.0 lb

## 2017-05-28 DIAGNOSIS — I10 Essential (primary) hypertension: Secondary | ICD-10-CM

## 2017-05-28 DIAGNOSIS — E785 Hyperlipidemia, unspecified: Secondary | ICD-10-CM

## 2017-05-28 LAB — COMPREHENSIVE METABOLIC PANEL
ALK PHOS: 58 U/L (ref 39–117)
ALT: 19 U/L (ref 0–35)
AST: 18 U/L (ref 0–37)
Albumin: 4.6 g/dL (ref 3.5–5.2)
BUN: 17 mg/dL (ref 6–23)
CHLORIDE: 101 meq/L (ref 96–112)
CO2: 30 meq/L (ref 19–32)
Calcium: 9.7 mg/dL (ref 8.4–10.5)
Creatinine, Ser: 0.77 mg/dL (ref 0.40–1.20)
GFR: 78.56 mL/min (ref >60.00)
GLUCOSE: 96 mg/dL (ref 70–99)
POTASSIUM: 3.7 meq/L (ref 3.5–5.1)
SODIUM: 140 meq/L (ref 135–145)
Total Bilirubin: 0.7 mg/dL (ref 0.2–1.2)
Total Protein: 7 g/dL (ref 6.0–8.3)

## 2017-05-28 LAB — LIPID PANEL
CHOL/HDL RATIO: 3
Cholesterol: 163 mg/dL (ref 0–200)
HDL: 64.7 mg/dL (ref >39.00)
LDL Cholesterol: 74 mg/dL (ref 0–99)
NONHDL: 98.5
Triglycerides: 124 mg/dL (ref 0.0–149.0)
VLDL: 24.8 mg/dL (ref 0.0–40.0)

## 2017-05-28 NOTE — Progress Notes (Signed)
Patient ID: CHRISTOL THETFORD, female   DOB: Mar 13, 1946, 71 y.o.   MRN: [id number]     Subjective:  I acted as a Education administrator for Dr. Carollee Herter.  [name], [place]   Patient ID: Arnoldo Lenis, female    DOB: September 28, 1946, 71 y.o.   MRN: [id number]  Chief Complaint  Patient presents with  . Hypertension    HPI  Patient is in today for follow up blood pressure.  Blood pressures have been running good at home.  Patient Care Team: Carollee Herter, Alferd Apa, DO as PCP - General Delila Pereyra, MD as Consulting Physician (Gynecology)   Past Medical History:  Diagnosis Date  . Allergy   . Colitis    history of  . GERD (gastroesophageal reflux disease)   . Hyperlipidemia   . Hypertension   . TGA (transient global amnesia)    05/2016    Past Surgical History:  Procedure Laterality Date  . CATARACT EXTRACTION Bilateral   . TONSILLECTOMY      Family History  Problem Relation Age of Onset  . Hypertension Mother   . Hyperlipidemia Mother   . Diabetes Father   . Heart disease Father        chf  . Hypertension Father   . Hyperlipidemia Father   . Coronary artery disease Unknown   . Breast cancer Paternal Grandmother 13  . Colon cancer Neg Hx     Social History   Social History  . Marital status: Married    Spouse name: N/A  . Number of children: 2  . Years of education: N/A   Occupational History  . retired [employer]    retired July 2015   Social History Main Topics  . Smoking status: Never Smoker  . Smokeless tobacco: Never Used  . Alcohol use No  . Drug use: No  . Sexual activity: Yes    Partners: Male   Other Topics Concern  . Not on file   Social History Narrative   Exercising---walking          Outpatient Medications Prior to Visit  Medication Sig Dispense Refill  . aspirin 81 MG tablet Take 81 mg by mouth daily.    Marland Kitchen buPROPion (WELLBUTRIN XL) 300 MG 24 hr tablet TAKE 1 TABLET BY MOUTH EVERY DAY 90 tablet 1  . fluticasone (FLONASE) 50 MCG/ACT nasal spray  Place 2 sprays into both nostrils as needed for allergies or rhinitis.    Marland Kitchen ibuprofen (ADVIL,MOTRIN) 200 MG tablet Take 400 mg by mouth at bedtime as needed (pain/ sleep).    Marland Kitchen levocetirizine (XYZAL) 5 MG tablet TAKE 1 TABLET (5 MG TOTAL) BY MOUTH DAILY. 90 tablet 1  . Multiple Vitamin (MULTIVITAMIN WITH MINERALS) TABS tablet Take 1 tablet by mouth at bedtime.    Marland Kitchen omeprazole (PRILOSEC) 20 MG capsule TAKE 1 CAPSULE (20 MG TOTAL) BY MOUTH DAILY. 90 capsule 3  . simvastatin (ZOCOR) 40 MG tablet Take 1 tablet (40 mg total) by mouth at bedtime. 90 tablet 1  . valsartan-hydrochlorothiazide (DIOVAN-HCT) 80-12.5 MG tablet Take 1 tablet by mouth daily. 90 tablet 1  . ranitidine (ZANTAC) 150 MG tablet Take 1 tablet (150 mg total) by mouth daily. 30 tablet 3  . ranitidine (ZANTAC) 150 MG tablet TAKE 1 TABLET EVERY DAY 30 tablet 1   Facility-Administered Medications Prior to Visit  Medication Dose Route Frequency Provider Last Rate Last Dose  . 0.9 %  sodium chloride infusion  500 mL Intravenous Continuous [place] Cellar  Eddie Dibbles, MD        No Known Allergies  Review of Systems  Constitutional: Negative for fever and malaise/fatigue.  HENT: Negative for congestion.   Eyes: Negative for blurred vision.  Respiratory: Negative for cough and shortness of breath.   Cardiovascular: Negative for chest pain, palpitations and leg swelling.  Gastrointestinal: Negative for vomiting.  Musculoskeletal: Negative for back pain.  Skin: Negative for rash.  Neurological: Negative for loss of consciousness and headaches.       Objective:    Physical Exam  Constitutional: She is oriented to person, place, and time. She appears well-developed and well-nourished. No distress.  HENT:  Head: Normocephalic and atraumatic.  Eyes: Conjunctivae are normal.  Neck: Normal range of motion. No thyromegaly present.  Cardiovascular: Normal rate and regular rhythm.   Pulmonary/Chest: Effort normal and breath sounds normal.  She has no wheezes.  Abdominal: Soft. Bowel sounds are normal. There is no tenderness.  Musculoskeletal: Normal range of motion. She exhibits no edema or deformity.  Neurological: She is alert and oriented to person, place, and time.  Skin: Skin is warm and dry. She is not diaphoretic.  Psychiatric: She has a normal mood and affect.    BP 132/78 (BP Location: Left Arm, Cuff Size: Normal)   Pulse 86   Temp 98.5 F (36.9 C) (Oral)   Resp 16   Ht 5\' 6"  (1.676 m)   Wt 202 lb (91.6 kg)   LMP  (LMP Unknown)   SpO2 95%   BMI 32.60 kg/m  Wt Readings from Last 3 Encounters:  05/28/17 202 lb (91.6 kg)  11/28/16 205 lb 6.4 oz (93.2 kg)  09/08/16 198 lb (89.8 kg)   BP Readings from Last 3 Encounters:  05/28/17 132/78  11/28/16 130/78  09/08/16 121/62     Immunization History  Administered Date(s) Administered  . Influenza Split 08/26/2011, 07/27/2012  . Influenza Whole 08/03/2009, 07/30/2010  . Influenza, High Dose Seasonal PF 07/30/2015  . Influenza,inj,Quad PF,36+ Mos 07/28/2013, 10/13/2014, 12/03/2016  . Pneumococcal Conjugate-13 10/13/2014  . Pneumococcal Polysaccharide-23 07/29/2011  . Td 09/20/2002  . Tdap 04/23/2011  . Zoster 11/16/2009    Health Maintenance  Topic Date Due  . INFLUENZA VACCINE  06/03/2017  . MAMMOGRAM  11/25/2017  . TETANUS/TDAP  04/22/2021  . COLONOSCOPY  09/08/2026  . DEXA SCAN  Completed  . Hepatitis C Screening  Completed  . PNA vac Low Risk Adult  Completed    Lab Results  Component Value Date   WBC 4.8 11/28/2016   HGB 12.1 11/28/2016   HCT 35.4 (L) 11/28/2016   PLT 239.0 11/28/2016   GLUCOSE 106 (H) 11/28/2016   CHOL 166 11/28/2016   TRIG 77.0 11/28/2016   HDL 66.60 11/28/2016   LDLDIRECT 77.0 11/08/2015   LDLCALC 84 11/28/2016   ALT 22 11/28/2016   AST 19 11/28/2016   NA 139 11/28/2016   K 3.9 11/28/2016   CL 103 11/28/2016   CREATININE 0.73 11/28/2016   BUN 15 11/28/2016   CO2 30 11/28/2016   TSH 1.26 11/08/2015   INR  0.89 06/02/2016    Lab Results  Component Value Date   TSH 1.26 11/08/2015   Lab Results  Component Value Date   WBC 4.8 11/28/2016   HGB 12.1 11/28/2016   HCT 35.4 (L) 11/28/2016   MCV 83.2 11/28/2016   PLT 239.0 11/28/2016   Lab Results  Component Value Date   NA 139 11/28/2016   K 3.9 11/28/2016   CO2  30 11/28/2016   GLUCOSE 106 (H) 11/28/2016   BUN 15 11/28/2016   CREATININE 0.73 11/28/2016   BILITOT 0.4 11/28/2016   ALKPHOS 64 11/28/2016   AST 19 11/28/2016   ALT 22 11/28/2016   PROT 7.0 11/28/2016   ALBUMIN 4.4 11/28/2016   CALCIUM 9.3 11/28/2016   ANIONGAP 7 06/03/2016   GFR 83.67 11/28/2016   Lab Results  Component Value Date   CHOL 166 11/28/2016   Lab Results  Component Value Date   HDL 66.60 11/28/2016   Lab Results  Component Value Date   LDLCALC 84 11/28/2016   Lab Results  Component Value Date   TRIG 77.0 11/28/2016   Lab Results  Component Value Date   CHOLHDL 2 11/28/2016   No results found for: HGBA1C       Assessment & Plan:   Problem List Items Addressed This Visit      Unprioritized   Essential hypertension    Well controlled, no changes to meds. Encouraged heart healthy diet such as the DASH diet and exercise as tolerated.       Relevant Orders   Comprehensive metabolic panel   Lipid panel   Hyperlipidemia LDL goal <100 - Primary    .Tolerating statin, encouraged heart healthy diet, avoid trans fats, minimize simple carbs and saturated fats. Increase exercise as tolerated      Relevant Orders   Comprehensive metabolic panel   Lipid panel      I have discontinued Ms. Brees's ranitidine and ranitidine. I am also having her maintain her multivitamin with minerals, ibuprofen, aspirin, fluticasone, levocetirizine, omeprazole, buPROPion, valsartan-hydrochlorothiazide, and simvastatin. We will continue to administer sodium chloride.  No orders of the defined types were placed in this encounter.   CMA served as Education administrator  during this visit. History, Physical and Plan performed by medical provider. Documentation and orders reviewed and attested to.  Ann Held, DO

## 2017-05-28 NOTE — Assessment & Plan Note (Signed)
Well controlled, no changes to meds. Encouraged heart healthy diet such as the DASH diet and exercise as tolerated.  °

## 2017-05-28 NOTE — Assessment & Plan Note (Signed)
Tolerating statin, encouraged heart healthy diet, avoid trans fats, minimize simple carbs and saturated fats. Increase exercise as tolerated 

## 2017-05-28 NOTE — Patient Instructions (Signed)

## 2017-06-01 ENCOUNTER — Other Ambulatory Visit: Payer: Self-pay | Admitting: *Deleted

## 2017-06-01 DIAGNOSIS — E785 Hyperlipidemia, unspecified: Secondary | ICD-10-CM

## 2017-06-01 DIAGNOSIS — I10 Essential (primary) hypertension: Secondary | ICD-10-CM

## 2017-07-27 ENCOUNTER — Other Ambulatory Visit: Payer: Self-pay | Admitting: Family Medicine

## 2017-07-27 DIAGNOSIS — I10 Essential (primary) hypertension: Secondary | ICD-10-CM

## 2017-09-03 ENCOUNTER — Telehealth: Payer: Self-pay | Admitting: Family Medicine

## 2017-09-03 ENCOUNTER — Other Ambulatory Visit: Payer: Self-pay | Admitting: Family Medicine

## 2017-09-03 NOTE — Telephone Encounter (Signed)
rx sent in for xyzal

## 2017-09-03 NOTE — Telephone Encounter (Signed)
Pt states pharmacy told her we were not responding to their refill request for LEVORETINIZINE Harlow Ohms). Informed pt no refill request noted in chart and will check with provider.

## 2017-10-23 ENCOUNTER — Other Ambulatory Visit: Payer: Self-pay | Admitting: Family Medicine

## 2017-10-23 DIAGNOSIS — E785 Hyperlipidemia, unspecified: Secondary | ICD-10-CM

## 2017-11-14 ENCOUNTER — Other Ambulatory Visit: Payer: Self-pay | Admitting: Family Medicine

## 2017-11-24 ENCOUNTER — Ambulatory Visit: Payer: Federal, State, Local not specified - PPO | Admitting: Family Medicine

## 2017-11-24 ENCOUNTER — Encounter: Payer: Self-pay | Admitting: Family Medicine

## 2017-11-24 VITALS — BP 146/82 | HR 78 | Temp 98.5°F | Resp 16 | Ht 66.0 in | Wt 209.8 lb

## 2017-11-24 DIAGNOSIS — E785 Hyperlipidemia, unspecified: Secondary | ICD-10-CM | POA: Diagnosis not present

## 2017-11-24 DIAGNOSIS — I10 Essential (primary) hypertension: Secondary | ICD-10-CM

## 2017-11-24 NOTE — Patient Instructions (Signed)

## 2017-11-24 NOTE — Progress Notes (Signed)
Patient ID: Robin Wall, female    DOB: 06/11/1946  Age: 72 y.o. MRN: [id number]    Subjective:  Subjective  HPI Robin Wall presents for recheck bp and cholesterol.  No complaints.    Review of Systems  Constitutional: Negative for activity change, appetite change, fatigue and unexpected weight change.  Respiratory: Negative for cough and shortness of breath.   Cardiovascular: Negative for chest pain and palpitations.  Psychiatric/Behavioral: Negative for behavioral problems and dysphoric mood. The patient is not nervous/anxious.     History Past Medical History:  Diagnosis Date  . Allergy   . Colitis    history of  . GERD (gastroesophageal reflux disease)   . Hyperlipidemia   . Hypertension   . TGA (transient global amnesia)    05/2016    She has a past surgical history that includes Cataract extraction (Bilateral) and Tonsillectomy.   Her family history includes Breast cancer (age of onset: 61) in her paternal grandmother; Coronary artery disease in her unknown relative; Diabetes in her father; Heart disease in her father; Hyperlipidemia in her father and mother; Hypertension in her father and mother.She reports that  has never smoked. she has never used smokeless tobacco. She reports that she does not drink alcohol or use drugs.  Current Outpatient Medications on File Prior to Visit  Medication Sig Dispense Refill  . aspirin 81 MG tablet Take 81 mg by mouth daily.    Marland Kitchen buPROPion (WELLBUTRIN XL) 300 MG 24 hr tablet TAKE 1 TABLET BY MOUTH EVERY DAY 90 tablet 1  . fluticasone (FLONASE) 50 MCG/ACT nasal spray Place 2 sprays into both nostrils as needed for allergies or rhinitis.    Marland Kitchen FLUZONE HIGH-DOSE 0.5 ML injection TO BE ADMINISTERED BY PHARMACIST FOR IMMUNIZATION  0  . ibuprofen (ADVIL,MOTRIN) 200 MG tablet Take 400 mg by mouth at bedtime as needed (pain/ sleep).    Marland Kitchen levocetirizine (XYZAL) 5 MG tablet TAKE 1 TABLET (5 MG TOTAL) BY MOUTH DAILY. 90 tablet 1  .  Multiple Vitamin (MULTIVITAMIN WITH MINERALS) TABS tablet Take 1 tablet by mouth at bedtime.    Marland Kitchen omeprazole (PRILOSEC) 20 MG capsule TAKE 1 CAPSULE (20 MG TOTAL) BY MOUTH DAILY. 90 capsule 3  . simvastatin (ZOCOR) 40 MG tablet Take 1 tablet (40 mg total) by mouth at bedtime. 90 tablet 0  . valsartan-hydrochlorothiazide (DIOVAN-HCT) 80-12.5 MG tablet TAKE 1 TABLET BY MOUTH DAILY. 90 tablet 1   Current Facility-Administered Medications on File Prior to Visit  Medication Dose Route Frequency Provider Last Rate Last Dose  . 0.9 %  sodium chloride infusion  500 mL Intravenous Continuous Armbruster, Carlota Raspberry, MD         Objective:  Objective  Physical Exam  Constitutional: She is oriented to person, place, and time. She appears well-developed and well-nourished.  HENT:  Head: Normocephalic and atraumatic.  Eyes: Conjunctivae and EOM are normal.  Neck: Normal range of motion. Neck supple. No JVD present. Carotid bruit is not present. No thyromegaly present.  Cardiovascular: Normal rate, regular rhythm and normal heart sounds.  No murmur heard. Pulmonary/Chest: Effort normal and breath sounds normal. No respiratory distress. She has no wheezes. She has no rales. She exhibits no tenderness.  Musculoskeletal: She exhibits no edema.  Neurological: She is alert and oriented to person, place, and time.  Psychiatric: She has a normal mood and affect.  Nursing note and vitals reviewed.  BP (!) 146/82   Pulse 78   Temp 98.5 F (36.9  C) (Oral)   Resp 16   Ht 5\' 6"  (1.676 m)   Wt 209 lb 12.8 oz (95.2 kg)   LMP  (LMP Unknown)   SpO2 97%   BMI 33.86 kg/m  Wt Readings from Last 3 Encounters:  11/24/17 209 lb 12.8 oz (95.2 kg)  05/28/17 202 lb (91.6 kg)  11/28/16 205 lb 6.4 oz (93.2 kg)     Lab Results  Component Value Date   WBC 4.8 11/28/2016   HGB 12.1 11/28/2016   HCT 35.4 (L) 11/28/2016   PLT 239.0 11/28/2016   GLUCOSE 96 05/28/2017   CHOL 163 05/28/2017   TRIG 124.0 05/28/2017    HDL 64.70 05/28/2017   LDLDIRECT 77.0 11/08/2015   LDLCALC 74 05/28/2017   ALT 19 05/28/2017   AST 18 05/28/2017   NA 140 05/28/2017   K 3.7 05/28/2017   CL 101 05/28/2017   CREATININE 0.77 05/28/2017   BUN 17 05/28/2017   CO2 30 05/28/2017   TSH 1.26 11/08/2015   INR 0.89 06/02/2016    Mm Digital Screening Bilateral  Result Date: 11/25/2016 CLINICAL DATA:  Screening. EXAM: DIGITAL SCREENING BILATERAL MAMMOGRAM WITH CAD COMPARISON:  Previous exam(s). ACR Breast Density Category c: The breast tissue is heterogeneously dense, which may obscure small masses. FINDINGS: There are no findings suspicious for malignancy. Images were processed with CAD. IMPRESSION: No mammographic evidence of malignancy. A result letter of this screening mammogram will be mailed directly to the patient. RECOMMENDATION: Screening mammogram in one year. (Code:SM-B-01Y) BI-RADS CATEGORY  1: Negative. Electronically Signed   By: Lovey Newcomer M.D.   On: 11/25/2016 13:18     Assessment & Plan:  Plan  I am having Robin Wall maintain her multivitamin with minerals, ibuprofen, aspirin, fluticasone, buPROPion, valsartan-hydrochlorothiazide, levocetirizine, simvastatin, omeprazole, and FLUZONE HIGH-DOSE. We will continue to administer sodium chloride.  No orders of the defined types were placed in this encounter.   Problem List Items Addressed This Visit      Unprioritized   Essential hypertension    Well controlled, no changes to meds. Encouraged heart healthy diet such as the DASH diet and exercise as tolerated.        Relevant Orders   Lipid panel   Comprehensive metabolic panel   Hyperlipidemia LDL goal <100 - Primary    Tolerating statin, encouraged heart healthy diet, avoid trans fats, minimize simple carbs and saturated fats. Increase exercise as tolerated      Relevant Orders   Lipid panel   Comprehensive metabolic panel      Follow-up: Return in about 3 months (around 02/22/2018), or if  symptoms worsen or fail to improve, for hypertension.  Ann Held, DO

## 2017-11-24 NOTE — Assessment & Plan Note (Signed)
Well controlled, no changes to meds. Encouraged heart healthy diet such as the DASH diet and exercise as tolerated.  °

## 2017-11-24 NOTE — Assessment & Plan Note (Addendum)
Tolerating statin, encouraged heart healthy diet, avoid trans fats, minimize simple carbs and saturated fats. Increase exercise as tolerated 

## 2017-11-25 LAB — COMPREHENSIVE METABOLIC PANEL
ALBUMIN: 4.5 g/dL (ref 3.5–5.2)
ALK PHOS: 71 U/L (ref 39–117)
ALT: 20 U/L (ref 0–35)
AST: 18 U/L (ref 0–37)
BUN: 13 mg/dL (ref 6–23)
CALCIUM: 9.6 mg/dL (ref 8.4–10.5)
CHLORIDE: 101 meq/L (ref 96–112)
CO2: 26 mEq/L (ref 19–32)
Creatinine, Ser: 0.75 mg/dL (ref 0.40–1.20)
GFR: 80.87 mL/min (ref >60.00)
Glucose, Bld: 94 mg/dL (ref 70–99)
POTASSIUM: 4.6 meq/L (ref 3.5–5.1)
Sodium: 139 mEq/L (ref 135–145)
Total Bilirubin: 0.5 mg/dL (ref 0.2–1.2)
Total Protein: 7.1 g/dL (ref 6.0–8.3)

## 2017-11-25 LAB — LIPID PANEL
Cholesterol: 170 mg/dL (ref 0–200)
HDL: 68.2 mg/dL (ref >39.00)
LDL CALC: 72 mg/dL (ref 0–99)
NonHDL: 101.45
TRIGLYCERIDES: 148 mg/dL (ref 0.0–149.0)
Total CHOL/HDL Ratio: 2
VLDL: 29.6 mg/dL (ref 0.0–40.0)

## 2017-12-01 ENCOUNTER — Other Ambulatory Visit: Payer: Self-pay | Admitting: Family Medicine

## 2017-12-01 DIAGNOSIS — Z1231 Encounter for screening mammogram for malignant neoplasm of breast: Secondary | ICD-10-CM

## 2017-12-17 ENCOUNTER — Ambulatory Visit
Admission: RE | Admit: 2017-12-17 | Discharge: 2017-12-17 | Disposition: A | Discharge status: Home / Self Care | Payer: Federal, State, Local not specified - PPO | Source: Ambulatory Visit | Attending: Family Medicine | Admitting: Family Medicine

## 2017-12-17 DIAGNOSIS — Z1231 Encounter for screening mammogram for malignant neoplasm of breast: Secondary | ICD-10-CM

## 2017-12-18 ENCOUNTER — Other Ambulatory Visit: Payer: Self-pay | Admitting: Family Medicine

## 2017-12-18 DIAGNOSIS — R928 Other abnormal and inconclusive findings on diagnostic imaging of breast: Secondary | ICD-10-CM

## 2017-12-23 ENCOUNTER — Ambulatory Visit
Admission: RE | Admit: 2017-12-23 | Discharge: 2017-12-23 | Disposition: A | Discharge status: Home / Self Care | Payer: Federal, State, Local not specified - PPO | Source: Ambulatory Visit | Attending: Family Medicine | Admitting: Family Medicine

## 2017-12-23 ENCOUNTER — Ambulatory Visit: Admission: RE | Admit: 2017-12-23 | Payer: Federal, State, Local not specified - PPO | Source: Ambulatory Visit

## 2017-12-23 DIAGNOSIS — R928 Other abnormal and inconclusive findings on diagnostic imaging of breast: Secondary | ICD-10-CM

## 2018-01-11 ENCOUNTER — Other Ambulatory Visit: Payer: Self-pay | Admitting: Family Medicine

## 2018-01-11 DIAGNOSIS — I10 Essential (primary) hypertension: Secondary | ICD-10-CM

## 2018-01-22 ENCOUNTER — Other Ambulatory Visit: Payer: Self-pay | Admitting: Family Medicine

## 2018-01-22 DIAGNOSIS — E785 Hyperlipidemia, unspecified: Secondary | ICD-10-CM

## 2018-02-01 ENCOUNTER — Other Ambulatory Visit: Payer: Self-pay | Admitting: Family Medicine

## 2018-02-22 ENCOUNTER — Ambulatory Visit: Payer: Federal, State, Local not specified - PPO | Admitting: Family Medicine

## 2018-02-22 ENCOUNTER — Encounter: Payer: Self-pay | Admitting: Family Medicine

## 2018-02-22 VITALS — BP 140/72 | HR 85 | Temp 98.1°F | Resp 16 | Ht 66.0 in | Wt 208.4 lb

## 2018-02-22 DIAGNOSIS — F320 Major depressive disorder, single episode, mild: Secondary | ICD-10-CM

## 2018-02-22 DIAGNOSIS — F321 Major depressive disorder, single episode, moderate: Secondary | ICD-10-CM | POA: Insufficient documentation

## 2018-02-22 DIAGNOSIS — F419 Anxiety disorder, unspecified: Secondary | ICD-10-CM | POA: Insufficient documentation

## 2018-02-22 MED ORDER — SERTRALINE HCL 50 MG PO TABS: ORAL_TABLET | ORAL | 3 refills | Status: DC

## 2018-02-22 NOTE — Assessment & Plan Note (Signed)
Add zoloft  rto 1 month

## 2018-02-22 NOTE — Assessment & Plan Note (Signed)
Worsening con't wellbutrin Add zoloft 50 mg--- 1/2 po qd x 1 week then 1 po qd

## 2018-02-22 NOTE — Progress Notes (Signed)
Patient ID: Robin Wall, female   DOB: 11/02/1946, 72 y.o.   MRN: [id number]     Subjective:  I acted as a Education administrator for Dr. Carollee Herter.  Robin Wall, Robin Wall   Patient ID: Robin Wall, female    DOB: 1946/08/01, 72 y.o.   MRN: [id number]  Chief Complaint  Patient presents with  . Hypertension    HPI  Patient is in today for follow up blood pressure.    Pt only complaint is that she feels like her anxety/ depression is worsening.       Patient Care Team: Carollee Herter, Alferd Apa, DO as PCP - General Delila Pereyra, MD as Consulting Physician (Gynecology)   Past Medical History:  Diagnosis Date  . Allergy   . Colitis    history of  . GERD (gastroesophageal reflux disease)   . Hyperlipidemia   . Hypertension   . TGA (transient global amnesia)    05/2016    Past Surgical History:  Procedure Laterality Date  . CATARACT EXTRACTION Bilateral   . TONSILLECTOMY      Family History  Problem Relation Age of Onset  . Hypertension Mother   . Hyperlipidemia Mother   . Diabetes Father   . Heart disease Father        chf  . Hypertension Father   . Hyperlipidemia Father   . Coronary artery disease Unknown   . Breast cancer Paternal Grandmother 62  . Colon cancer Neg Hx     Social History   Socioeconomic History  . Marital status: Married    Spouse name: Not on file  . Number of children: 2  . Years of education: Not on file  . Highest education level: Not on file  Occupational History  . 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 Financial resource strain: Not on file  . Food insecurity:    Worry: Not on file    Inability: Not on file  . Transportation needs:    Medical: Not on file    Non-medical: Not on file  Tobacco Use  . Smoking status: Never Smoker  . Smokeless tobacco: Never Used  Substance and Sexual Activity  . Alcohol use: No  . Drug use: No  . Sexual activity: Yes    Partners: Male  Lifestyle  .  Physical activity:    Days per week: Not on file    Minutes per session: Not on file  . Stress: Not on file  Relationships  . Social connections:    Talks on phone: Not on file    Gets together: Not on file    Attends religious service: Not on file    Active member of club or organization: Not on file    Attends meetings of clubs or organizations: Not on file    Relationship status: Not on file  . Intimate partner violence:    Fear of current or ex partner: Not on file    Emotionally abused: Not on file    Physically abused: Not on file    Forced sexual activity: Not on file  Other Topics Concern  . Not on file  Social History Narrative   Exercising---walking       Outpatient Medications Prior to Visit  Medication Sig Dispense Refill  . aspirin 81 MG tablet Take 81 mg by mouth daily.    Marland Kitchen buPROPion (WELLBUTRIN XL) 300 MG 24 hr tablet TAKE  1 TABLET BY MOUTH EVERY DAY 90 tablet 1  . fluticasone (FLONASE) 50 MCG/ACT nasal spray Place 2 sprays into both nostrils as needed for allergies or rhinitis.    Marland Kitchen ibuprofen (ADVIL,MOTRIN) 200 MG tablet Take 400 mg by mouth at bedtime as needed (pain/ sleep).    Marland Kitchen levocetirizine (XYZAL) 5 MG tablet TAKE 1 TABLET (5 MG TOTAL) BY MOUTH DAILY. 90 tablet 1  . Multiple Vitamin (MULTIVITAMIN WITH MINERALS) TABS tablet Take 1 tablet by mouth at bedtime.    Marland Kitchen omeprazole (PRILOSEC) 20 MG capsule TAKE 1 CAPSULE (20 MG TOTAL) BY MOUTH DAILY. 90 capsule 3  . simvastatin (ZOCOR) 40 MG tablet TAKE 1 TABLET BY MOUTH EVERYDAY AT BEDTIME 90 tablet 0  . valsartan-hydrochlorothiazide (DIOVAN-HCT) 80-12.5 MG tablet TAKE 1 TABLET BY MOUTH EVERY DAY 90 tablet 1  . FLUZONE HIGH-DOSE 0.5 ML injection TO BE ADMINISTERED BY PHARMACIST FOR IMMUNIZATION  0   Facility-Administered Medications Prior to Visit  Medication Dose Route Frequency Provider Last Rate Last Dose  . 0.9 %  sodium chloride infusion  500 mL Intravenous Continuous Armbruster, Carlota Raspberry, MD        No  Known Allergies  Review of Systems  Constitutional: Negative for fever and malaise/fatigue.  HENT: Negative for congestion.   Eyes: Negative for blurred vision.  Respiratory: Negative for cough and shortness of breath.   Cardiovascular: Negative for chest pain, palpitations and leg swelling.  Gastrointestinal: Negative for vomiting.  Musculoskeletal: Negative for back pain.  Skin: Negative for rash.  Neurological: Negative for loss of consciousness and headaches.  Psychiatric/Behavioral: Positive for depression. The patient is nervous/anxious.        Objective:    Physical Exam  Constitutional: She is oriented to person, place, and time. She appears well-developed and well-nourished.  HENT:  Head: Normocephalic and atraumatic.  Eyes: Conjunctivae and EOM are normal.  Neck: Normal range of motion. Neck supple. No JVD present. Carotid bruit is not present. No thyromegaly present.  Cardiovascular: Normal rate, regular rhythm and normal heart sounds.  No murmur heard. Pulmonary/Chest: Effort normal and breath sounds normal. No respiratory distress. She has no wheezes. She has no rales. She exhibits no tenderness.  Musculoskeletal: She exhibits no edema.  Neurological: She is alert and oriented to person, place, and time.  Psychiatric: Her mood appears anxious. She exhibits a depressed mood.  Nursing note and vitals reviewed.   BP 140/72   Pulse 85   Temp 98.1 F (36.7 C) (Oral)   Resp 16   Ht 5\' 6"  (1.676 m)   Wt 208 lb 6.4 oz (94.5 kg)   LMP  (LMP Unknown)   SpO2 97%   BMI 33.64 kg/m  Wt Readings from Last 3 Encounters:  02/22/18 208 lb 6.4 oz (94.5 kg)  11/24/17 209 lb 12.8 oz (95.2 kg)  05/28/17 202 lb (91.6 kg)   BP Readings from Last 3 Encounters:  02/22/18 140/72  11/24/17 (!) 146/82  05/28/17 132/78     Immunization History  Administered Date(s) Administered  . Influenza Split 08/26/2011, 07/27/2012  . Influenza Whole 08/03/2009, 07/30/2010  . Influenza,  High Dose Seasonal PF 07/30/2015  . Influenza,inj,Quad PF,6+ Mos 07/28/2013, 10/13/2014, 12/03/2016  . Pneumococcal Conjugate-13 10/13/2014  . Pneumococcal Polysaccharide-23 07/29/2011  . Td 09/20/2002  . Tdap 04/23/2011  . Zoster 11/16/2009    Health Maintenance  Topic Date Due  . INFLUENZA VACCINE  06/03/2018  . MAMMOGRAM  12/17/2018  . TETANUS/TDAP  04/22/2021  . COLONOSCOPY  09/08/2026  . DEXA SCAN  Completed  . Hepatitis C Screening  Completed  . PNA vac Low Risk Adult  Completed    Lab Results  Component Value Date   WBC 4.8 11/28/2016   HGB 12.1 11/28/2016   HCT 35.4 (L) 11/28/2016   PLT 239.0 11/28/2016   GLUCOSE 94 11/24/2017   CHOL 170 11/24/2017   TRIG 148.0 11/24/2017   HDL 68.20 11/24/2017   LDLDIRECT 77.0 11/08/2015   LDLCALC 72 11/24/2017   ALT 20 11/24/2017   AST 18 11/24/2017   NA 139 11/24/2017   K 4.6 11/24/2017   CL 101 11/24/2017   CREATININE 0.75 11/24/2017   BUN 13 11/24/2017   CO2 26 11/24/2017   TSH 1.26 11/08/2015   INR 0.89 06/02/2016    Lab Results  Component Value Date   TSH 1.26 11/08/2015   Lab Results  Component Value Date   WBC 4.8 11/28/2016   HGB 12.1 11/28/2016   HCT 35.4 (L) 11/28/2016   MCV 83.2 11/28/2016   PLT 239.0 11/28/2016   Lab Results  Component Value Date   NA 139 11/24/2017   K 4.6 11/24/2017   CO2 26 11/24/2017   GLUCOSE 94 11/24/2017   BUN 13 11/24/2017   CREATININE 0.75 11/24/2017   BILITOT 0.5 11/24/2017   ALKPHOS 71 11/24/2017   AST 18 11/24/2017   ALT 20 11/24/2017   PROT 7.1 11/24/2017   ALBUMIN 4.5 11/24/2017   CALCIUM 9.6 11/24/2017   ANIONGAP 7 06/03/2016   GFR 80.87 11/24/2017   Lab Results  Component Value Date   CHOL 170 11/24/2017   Lab Results  Component Value Date   HDL 68.20 11/24/2017   Lab Results  Component Value Date   LDLCALC 72 11/24/2017   Lab Results  Component Value Date   TRIG 148.0 11/24/2017   Lab Results  Component Value Date   CHOLHDL 2 11/24/2017    No results found for: HGBA1C       Assessment & Plan:   Problem List Items Addressed This Visit      Unprioritized   Anxiety    Add zoloft  rto 1 month      Relevant Medications   sertraline (ZOLOFT) 50 MG tablet   Depression    Worsening con't wellbutrin Add zoloft 50 mg--- 1/2 po qd x 1 week then 1 po qd        Relevant Medications   sertraline (ZOLOFT) 50 MG tablet   Depression, major, single episode, moderate (HCC) - Primary   Relevant Medications   sertraline (ZOLOFT) 50 MG tablet      I have discontinued Hassan Rowan A. Braddock's FLUZONE HIGH-DOSE. I am also having her start on sertraline. Additionally, I am having her maintain her multivitamin with minerals, ibuprofen, aspirin, fluticasone, levocetirizine, omeprazole, valsartan-hydrochlorothiazide, simvastatin, and buPROPion. We will continue to administer sodium chloride.  Meds ordered this encounter  Medications  . sertraline (ZOLOFT) 50 MG tablet    Sig: 1 po qd    Dispense:  30 tablet    Refill:  3    CMA served as scribe during this visit. History, Physical and Plan performed by medical provider. Documentation and orders reviewed and attested to.  Ann Held, DO

## 2018-02-22 NOTE — Patient Instructions (Signed)

## 2018-02-25 ENCOUNTER — Other Ambulatory Visit: Payer: Self-pay | Admitting: Family Medicine

## 2018-03-15 ENCOUNTER — Telehealth: Payer: Self-pay | Admitting: Family Medicine

## 2018-03-15 NOTE — Telephone Encounter (Signed)
Patient has appt on 03/25/18.  Do you want her to come in sooner?

## 2018-03-15 NOTE — Telephone Encounter (Signed)
How does she feel now?   May need to cut in half and see how she does with that  Happy to see her sooner if she needs to come in

## 2018-03-15 NOTE — Telephone Encounter (Signed)
Received triage call from [facility] portal, patient called 03/13/18 stating the zoloft was making her feel worse than she was, she is feeling more depressed and overwhelmed, no suicidal thoughts just feeling helpless, Oncall doctor was paged but mailbox was full.   Please advise

## 2018-03-16 NOTE — Telephone Encounter (Signed)
Spoke with pt she states she is dong much better she will see you at her 03/25/18 appointment no need for a sooner visit.

## 2018-03-25 ENCOUNTER — Ambulatory Visit: Payer: Federal, State, Local not specified - PPO | Admitting: Family Medicine

## 2018-03-25 ENCOUNTER — Encounter: Payer: Self-pay | Admitting: Family Medicine

## 2018-03-25 VITALS — BP 136/76 | HR 81 | Temp 98.7°F | Resp 16 | Ht 66.0 in | Wt 204.8 lb

## 2018-03-25 DIAGNOSIS — F321 Major depressive disorder, single episode, moderate: Secondary | ICD-10-CM | POA: Diagnosis not present

## 2018-03-25 NOTE — Progress Notes (Signed)
Patient ID: Robin Wall, female   DOB: Aug 31, 1946, 72 y.o.   MRN: [id number]     Subjective:  I acted as a Education administrator for Dr. Carollee Herter.  Robin Wall, Robin Wall   Patient ID: Robin Wall, female    DOB: [birth year], 72 y.o.   MRN: [id number]  Chief Complaint  Patient presents with  . Depression    HPI  Patient is in today for follow up depression.  Pt is doing well.  She is still depressed.    Patient Care Team: Carollee Herter, Alferd Apa, DO as PCP - General Delila Pereyra, MD as Consulting Physician (Gynecology)   Past Medical History:  Diagnosis Date  . Allergy   . Colitis    history of  . GERD (gastroesophageal reflux disease)   . Hyperlipidemia   . Hypertension   . TGA (transient global amnesia)    05/2016    Past Surgical History:  Procedure Laterality Date  . CATARACT EXTRACTION Bilateral   . TONSILLECTOMY      Family History  Problem Relation Age of Onset  . Hypertension Mother   . Hyperlipidemia Mother   . Diabetes Father   . Heart disease Father        chf  . Hypertension Father   . Hyperlipidemia Father   . Coronary artery disease Unknown   . Breast cancer Paternal Grandmother 47  . Colon cancer Neg Hx     Social History   Socioeconomic History  . Marital status: Married    Spouse name: Not on file  . Number of children: 2  . Years of education: Not on file  . Highest education level: Not on file  Occupational History  . 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 Financial resource strain: Not on file  . Food insecurity:    Worry: Not on file    Inability: Not on file  . Transportation needs:    Medical: Not on file    Non-medical: Not on file  Tobacco Use  . Smoking status: Never Smoker  . Smokeless tobacco: Never Used  Substance and Sexual Activity  . Alcohol use: No  . Drug use: No  . Sexual activity: Yes    Partners: Male  Lifestyle  . Physical activity:    Days per week: Not on file     Minutes per session: Not on file  . Stress: Not on file  Relationships  . Social connections:    Talks on phone: Not on file    Gets together: Not on file    Attends religious service: Not on file    Active member of club or organization: Not on file    Attends meetings of clubs or organizations: Not on file    Relationship status: Not on file  . Intimate partner violence:    Fear of current or ex partner: Not on file    Emotionally abused: Not on file    Physically abused: Not on file    Forced sexual activity: Not on file  Other Topics Concern  . Not on file  Social History Narrative   Exercising---walking       Outpatient Medications Prior to Visit  Medication Sig Dispense Refill  . aspirin 81 MG tablet Take 81 mg by mouth daily.    Marland Kitchen buPROPion (WELLBUTRIN XL) 300 MG 24 hr tablet TAKE 1 TABLET BY MOUTH EVERY DAY 90 tablet 1  .  fluticasone (FLONASE) 50 MCG/ACT nasal spray Place 2 sprays into both nostrils as needed for allergies or rhinitis.    Marland Kitchen ibuprofen (ADVIL,MOTRIN) 200 MG tablet Take 400 mg by mouth at bedtime as needed (pain/ sleep).    Marland Kitchen levocetirizine (XYZAL) 5 MG tablet TAKE 1 TABLET BY MOUTH EVERY DAY 90 tablet 1  . Multiple Vitamin (MULTIVITAMIN WITH MINERALS) TABS tablet Take 1 tablet by mouth at bedtime.    Marland Kitchen omeprazole (PRILOSEC) 20 MG capsule TAKE 1 CAPSULE (20 MG TOTAL) BY MOUTH DAILY. 90 capsule 3  . sertraline (ZOLOFT) 50 MG tablet 1 po qd 30 tablet 3  . simvastatin (ZOCOR) 40 MG tablet TAKE 1 TABLET BY MOUTH EVERYDAY AT BEDTIME 90 tablet 0  . valsartan-hydrochlorothiazide (DIOVAN-HCT) 80-12.5 MG tablet TAKE 1 TABLET BY MOUTH EVERY DAY 90 tablet 1   Facility-Administered Medications Prior to Visit  Medication Dose Route Frequency Provider Last Rate Last Dose  . 0.9 %  sodium chloride infusion  500 mL Intravenous Continuous Armbruster, Carlota Raspberry, MD        No Known Allergies  Review of Systems  Constitutional: Negative for fever and malaise/fatigue.    HENT: Negative for congestion.   Eyes: Negative for blurred vision.  Respiratory: Negative for cough and shortness of breath.   Cardiovascular: Negative for chest pain, palpitations and leg swelling.  Gastrointestinal: Negative for vomiting.  Musculoskeletal: Negative for back pain.  Skin: Negative for rash.  Neurological: Negative for loss of consciousness and headaches.       Objective:    Physical Exam  Constitutional: She is oriented to person, place, and time. She appears well-developed and well-nourished.  HENT:  Head: Normocephalic and atraumatic.  Eyes: Conjunctivae and EOM are normal.  Neck: Normal range of motion. Neck supple. No JVD present. Carotid bruit is not present. No thyromegaly present.  Cardiovascular: Normal rate, regular rhythm and normal heart sounds.  No murmur heard. Pulmonary/Chest: Effort normal and breath sounds normal. No respiratory distress. She has no wheezes. She has no rales. She exhibits no tenderness.  Abdominal: Soft. She exhibits no distension. There is no tenderness. There is no rebound, no guarding and no CVA tenderness.  Genitourinary: Pelvic exam was performed with patient supine. There is no rash, tenderness, lesion or injury on the right labia. There is no rash, tenderness, lesion or injury on the left labia. No erythema or tenderness in the vagina. No vaginal discharge found.  Musculoskeletal: She exhibits no edema.  Neurological: She is alert and oriented to person, place, and time.  Psychiatric: She has a normal mood and affect.  Nursing note and vitals reviewed.   BP 136/76 (BP Location: Right Arm, Cuff Size: Normal)   Pulse 81   Temp 98.7 F (37.1 C) (Oral)   Resp 16   Ht 5\' 6"  (1.676 m)   Wt 204 lb 12.8 oz (92.9 kg)   LMP  (LMP Unknown)   SpO2 96%   BMI 33.06 kg/m  Wt Readings from Last 3 Encounters:  03/25/18 204 lb 12.8 oz (92.9 kg)  02/22/18 208 lb 6.4 oz (94.5 kg)  11/24/17 209 lb 12.8 oz (95.2 kg)   BP Readings  from Last 3 Encounters:  03/25/18 136/76  02/22/18 140/72  11/24/17 (!) 146/82     Immunization History  Administered Date(s) Administered  . Influenza Split 08/26/2011, 07/27/2012  . Influenza Whole 08/03/2009, 07/30/2010  . Influenza, High Dose Seasonal PF 07/30/2015  . Influenza,inj,Quad PF,6+ Mos 07/28/2013, 10/13/2014, 12/03/2016  . Pneumococcal Conjugate-13  10/13/2014  . Pneumococcal Polysaccharide-23 07/29/2011  . Td 09/20/2002  . Tdap 04/23/2011  . Zoster 11/16/2009    Health Maintenance  Topic Date Due  . INFLUENZA VACCINE  06/03/2018  . MAMMOGRAM  12/17/2018  . TETANUS/TDAP  04/22/2021  . COLONOSCOPY  09/08/2026  . DEXA SCAN  Completed  . Hepatitis C Screening  Completed  . PNA vac Low Risk Adult  Completed    Lab Results  Component Value Date   WBC 4.8 11/28/2016   HGB 12.1 11/28/2016   HCT 35.4 (L) 11/28/2016   PLT 239.0 11/28/2016   GLUCOSE 94 11/24/2017   CHOL 170 11/24/2017   TRIG 148.0 11/24/2017   HDL 68.20 11/24/2017   LDLDIRECT 77.0 11/08/2015   LDLCALC 72 11/24/2017   ALT 20 11/24/2017   AST 18 11/24/2017   NA 139 11/24/2017   K 4.6 11/24/2017   CL 101 11/24/2017   CREATININE 0.75 11/24/2017   BUN 13 11/24/2017   CO2 26 11/24/2017   TSH 1.26 11/08/2015   INR 0.89 06/02/2016    Lab Results  Component Value Date   TSH 1.26 11/08/2015   Lab Results  Component Value Date   WBC 4.8 11/28/2016   HGB 12.1 11/28/2016   HCT 35.4 (L) 11/28/2016   MCV 83.2 11/28/2016   PLT 239.0 11/28/2016   Lab Results  Component Value Date   NA 139 11/24/2017   K 4.6 11/24/2017   CO2 26 11/24/2017   GLUCOSE 94 11/24/2017   BUN 13 11/24/2017   CREATININE 0.75 11/24/2017   BILITOT 0.5 11/24/2017   ALKPHOS 71 11/24/2017   AST 18 11/24/2017   ALT 20 11/24/2017   PROT 7.1 11/24/2017   ALBUMIN 4.5 11/24/2017   CALCIUM 9.6 11/24/2017   ANIONGAP 7 06/03/2016   GFR 80.87 11/24/2017   Lab Results  Component Value Date   CHOL 170 11/24/2017   Lab  Results  Component Value Date   HDL 68.20 11/24/2017   Lab Results  Component Value Date   LDLCALC 72 11/24/2017   Lab Results  Component Value Date   TRIG 148.0 11/24/2017   Lab Results  Component Value Date   CHOLHDL 2 11/24/2017   No results found for: HGBA1C       Assessment & Plan:   Problem List Items Addressed This Visit      Unprioritized   Depression, major, single episode, moderate ([place]) - Primary      I am having Daina A. Rice maintain her multivitamin with minerals, ibuprofen, aspirin, fluticasone, omeprazole, valsartan-hydrochlorothiazide, simvastatin, buPROPion, sertraline, and levocetirizine. We will continue to administer sodium chloride.  No orders of the defined types were placed in this encounter.   CMA served as Education administrator during this visit. History, Physical and Plan performed by medical provider. Documentation and orders reviewed and attested to.  Ann Held, DO

## 2018-03-25 NOTE — Patient Instructions (Signed)

## 2018-04-22 ENCOUNTER — Ambulatory Visit (INDEPENDENT_AMBULATORY_CARE_PROVIDER_SITE_OTHER): Payer: Federal, State, Local not specified - PPO | Admitting: Family Medicine

## 2018-04-22 ENCOUNTER — Encounter: Payer: Self-pay | Admitting: Family Medicine

## 2018-04-22 VITALS — BP 133/69 | HR 77 | Temp 97.7°F | Resp 18 | Ht 66.14 in | Wt 208.8 lb

## 2018-04-22 DIAGNOSIS — Z0000 Encounter for general adult medical examination without abnormal findings: Secondary | ICD-10-CM

## 2018-04-22 DIAGNOSIS — D229 Melanocytic nevi, unspecified: Secondary | ICD-10-CM

## 2018-04-22 DIAGNOSIS — I10 Essential (primary) hypertension: Secondary | ICD-10-CM | POA: Diagnosis not present

## 2018-04-22 DIAGNOSIS — E785 Hyperlipidemia, unspecified: Secondary | ICD-10-CM | POA: Diagnosis not present

## 2018-04-22 NOTE — Assessment & Plan Note (Signed)
Tolerating statin, encouraged heart healthy diet, avoid trans fats, minimize simple carbs and saturated fats. Increase exercise as tolerated 

## 2018-04-22 NOTE — Assessment & Plan Note (Signed)
Well controlled, no changes to meds. Encouraged heart healthy diet such as the DASH diet and exercise as tolerated.  °

## 2018-04-22 NOTE — Patient Instructions (Signed)
Preventive Care 72 Years and Older, Female Preventive care refers to lifestyle choices and visits with your health care provider that can promote health and wellness. What does preventive care include?  A yearly physical exam. This is also called an annual well check.  Dental exams once or twice a year.  Routine eye exams. Ask your health care provider how often you should have your eyes checked.  Personal lifestyle choices, including: ? Daily care of your teeth and gums. ? Regular physical activity. ? Eating a healthy diet. ? Avoiding tobacco and drug use. ? Limiting alcohol use. ? Practicing safe sex. ? Taking low-dose aspirin every day. ? Taking vitamin and mineral supplements as recommended by your health care provider. What happens during an annual well check? The services and screenings done by your health care provider during your annual well check will depend on your age, overall health, lifestyle risk factors, and family history of disease. Counseling Your health care provider may ask you questions about your:  Alcohol use.  Tobacco use.  Drug use.  Emotional well-being.  Home and relationship well-being.  Sexual activity.  Eating habits.  History of falls.  Memory and ability to understand (cognition).  Work and work environment.  Reproductive health.  Screening You may have the following tests or measurements:  Height, weight, and BMI.  Blood pressure.  Lipid and cholesterol levels. These may be checked every 5 years, or more frequently if you are over 50 years old.  Skin check.  Lung cancer screening. You may have this screening every year starting at age 55 if you have a 30-pack-year history of smoking and currently smoke or have quit within the past 15 years.  Fecal occult blood test (FOBT) of the stool. You may have this test every year starting at age 50.  Flexible sigmoidoscopy or colonoscopy. You may have a sigmoidoscopy every 5 years or  a colonoscopy every 10 years starting at age 50.  Hepatitis C blood test.  Hepatitis B blood test.  Sexually transmitted disease (STD) testing.  Diabetes screening. This is done by checking your blood sugar (glucose) after you have not eaten for a while (fasting). You may have this done every 1-3 years.  Bone density scan. This is done to screen for osteoporosis. You may have this done starting at age 65.  Mammogram. This may be done every 1-2 years. Talk to your health care provider about how often you should have regular mammograms.  Talk with your health care provider about your test results, treatment options, and if necessary, the need for more tests. Vaccines Your health care provider may recommend certain vaccines, such as:  Influenza vaccine. This is recommended every year.  Tetanus, diphtheria, and acellular pertussis (Tdap, Td) vaccine. You may need a Td booster every 10 years.  Varicella vaccine. You may need this if you have not been vaccinated.  Zoster vaccine. You may need this after age 60.  Measles, mumps, and rubella (MMR) vaccine. You may need at least one dose of MMR if you were born in 1957 or later. You may also need a second dose.  Pneumococcal 13-valent conjugate (PCV13) vaccine. One dose is recommended after age 65.  Pneumococcal polysaccharide (PPSV23) vaccine. One dose is recommended after age 65.  Meningococcal vaccine. You may need this if you have certain conditions.  Hepatitis A vaccine. You may need this if you have certain conditions or if you travel or work in places where you may be exposed to hepatitis   A.  Hepatitis B vaccine. You may need this if you have certain conditions or if you travel or work in places where you may be exposed to hepatitis B.  Haemophilus influenzae type b (Hib) vaccine. You may need this if you have certain conditions.  Talk to your health care provider about which screenings and vaccines you need and how often you  need them. This information is not intended to replace advice given to you by your health care provider. Make sure you discuss any questions you have with your health care provider. Document Released: 11/16/2015 Document Revised: 07/09/2016 Document Reviewed: 08/21/2015 Elsevier Interactive Patient Education  2018 Elsevier Inc.  

## 2018-04-22 NOTE — Progress Notes (Signed)
Subjective:  I acted as a Education administrator for [employer]. [name], [place]   Patient ID: Robin Wall, female    DOB: 1946-03-05, 72 y.o.   MRN: [id number]  Chief Complaint  Patient presents with  . Annual Exam    HPI  Patient is in today for annual exam.  Patient Care Team: Carollee Herter, Alferd Apa, DO as PCP - General Mezer, Nadara Mustard, MD as Consulting Physician (Gynecology)   Past Medical History:  Diagnosis Date  . Allergy   . Colitis    history of  . GERD (gastroesophageal reflux disease)   . Hyperlipidemia   . Hypertension   . TGA (transient global amnesia)    05/2016    Past Surgical History:  Procedure Laterality Date  . CATARACT EXTRACTION Bilateral   . TONSILLECTOMY      Family History  Problem Relation Age of Onset  . Hypertension Mother   . Hyperlipidemia Mother   . Diabetes Father   . Heart disease Father        chf  . Hypertension Father   . Hyperlipidemia Father   . Coronary artery disease Unknown   . Breast cancer Paternal Grandmother 5  . Colon cancer Neg Hx     Social History   Socioeconomic History  . Marital status: Married    Spouse name: Not on file  . Number of children: 2  . Years of education: Not on file  . Highest education level: Not on file  Occupational History  . 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 Financial resource strain: Not on file  . Food insecurity:    Worry: Not on file    Inability: Not on file  . Transportation needs:    Medical: Not on file    Non-medical: Not on file  Tobacco Use  . Smoking status: Never Smoker  . Smokeless tobacco: Never Used  Substance and Sexual Activity  . Alcohol use: No  . Drug use: No  . Sexual activity: Yes    Partners: Male  Lifestyle  . Physical activity:    Days per week: Not on file    Minutes per session: Not on file  . Stress: Not on file  Relationships  . Social connections:    Talks on phone: Not on file   Gets together: Not on file    Attends religious service: Not on file    Active member of club or organization: Not on file    Attends meetings of clubs or organizations: Not on file    Relationship status: Not on file  . Intimate partner violence:    Fear of current or ex partner: Not on file    Emotionally abused: Not on file    Physically abused: Not on file    Forced sexual activity: Not on file  Other Topics Concern  . Not on file  Social History Narrative   Exercising---walking       Outpatient Medications Prior to Visit  Medication Sig Dispense Refill  . aspirin 81 MG tablet Take 81 mg by mouth daily.    Marland Kitchen buPROPion (WELLBUTRIN XL) 300 MG 24 hr tablet TAKE 1 TABLET BY MOUTH EVERY DAY 90 tablet 1  . fluticasone (FLONASE) 50 MCG/ACT nasal spray Place 2 sprays into both nostrils as needed for allergies or rhinitis.    Marland Kitchen ibuprofen (ADVIL,MOTRIN) 200 MG tablet Take 400 mg by mouth at bedtime  as needed (pain/ sleep).    Marland Kitchen levocetirizine (XYZAL) 5 MG tablet TAKE 1 TABLET BY MOUTH EVERY DAY 90 tablet 1  . Multiple Vitamin (MULTIVITAMIN WITH MINERALS) TABS tablet Take 1 tablet by mouth at bedtime.    Marland Kitchen omeprazole (PRILOSEC) 20 MG capsule TAKE 1 CAPSULE (20 MG TOTAL) BY MOUTH DAILY. 90 capsule 3  . sertraline (ZOLOFT) 50 MG tablet 1 po qd 30 tablet 3  . simvastatin (ZOCOR) 40 MG tablet TAKE 1 TABLET BY MOUTH EVERYDAY AT BEDTIME 90 tablet 0  . valsartan-hydrochlorothiazide (DIOVAN-HCT) 80-12.5 MG tablet TAKE 1 TABLET BY MOUTH EVERY DAY 90 tablet 1   Facility-Administered Medications Prior to Visit  Medication Dose Route Frequency Provider Last Rate Last Dose  . 0.9 %  sodium chloride infusion  500 mL Intravenous Continuous Armbruster, Carlota Raspberry, MD        No Known Allergies  Review of Systems  Constitutional: Negative for chills, fever and malaise/fatigue.  HENT: Negative for congestion and hearing loss.   Eyes: Negative for blurred vision and discharge.  Respiratory: Negative for  cough, sputum production and shortness of breath.   Cardiovascular: Negative for chest pain, palpitations and leg swelling.  Gastrointestinal: Negative for abdominal pain, blood in stool, constipation, diarrhea, heartburn, nausea and vomiting.  Genitourinary: Negative for dysuria, frequency, hematuria and urgency.  Musculoskeletal: Negative for back pain, falls and myalgias.  Skin: Negative for rash.  Neurological: Negative for dizziness, sensory change, loss of consciousness, weakness and headaches.  Endo/Heme/Allergies: Negative for environmental allergies. Does not bruise/bleed easily.  Psychiatric/Behavioral: Negative for depression and suicidal ideas. The patient is not nervous/anxious and does not have insomnia.        Objective:    Physical Exam  Constitutional: She is oriented to person, place, and time. She appears well-developed and well-nourished. No distress.  HENT:  Head: Normocephalic and atraumatic.  Right Ear: External ear normal.  Left Ear: External ear normal.  Nose: Nose normal.  Mouth/Throat: Oropharynx is clear and moist.  Eyes: Pupils are equal, round, and reactive to light. Conjunctivae and EOM are normal. Right eye exhibits no discharge. Left eye exhibits no discharge.  Neck: Normal range of motion. Neck supple. No JVD present. No thyromegaly present.  Cardiovascular: Normal rate, regular rhythm, normal heart sounds and intact distal pulses.  No murmur heard. Pulmonary/Chest: Effort normal and breath sounds normal. No respiratory distress. She has no wheezes. She has no rales. She exhibits no tenderness.  Abdominal: Soft. Bowel sounds are normal. She exhibits no distension and no mass. There is no tenderness. There is no rebound and no guarding.  Genitourinary: Vagina normal and uterus normal. Rectal exam shows guaiac negative stool. No vaginal discharge found.  Musculoskeletal: Normal range of motion. She exhibits no edema or tenderness.  Lymphadenopathy:     She has no cervical adenopathy.  Neurological: She is alert and oriented to person, place, and time. She has normal reflexes. No cranial nerve deficit.  Skin: Skin is warm and dry. No rash noted. She is not diaphoretic. No erythema.  Psychiatric: She has a normal mood and affect. Her behavior is normal. Judgment and thought content normal.  Nursing note and vitals reviewed.   BP 133/69 (BP Location: Left Arm, Patient Position: Sitting, Cuff Size: Normal)   Pulse 77   Temp 97.7 F (36.5 C) (Oral)   Resp 18   Ht 5' 6.14" (1.68 m)   Wt 208 lb 12.8 oz (94.7 kg)   LMP  (LMP Unknown)  SpO2 97%   BMI 33.56 kg/m  Wt Readings from Last 3 Encounters:  04/22/18 208 lb 12.8 oz (94.7 kg)  03/25/18 204 lb 12.8 oz (92.9 kg)  02/22/18 208 lb 6.4 oz (94.5 kg)   BP Readings from Last 3 Encounters:  04/22/18 133/69  03/25/18 136/76  02/22/18 140/72     Immunization History  Administered Date(s) Administered  . Influenza Split 08/26/2011, 07/27/2012  . Influenza Whole 08/03/2009, 07/30/2010  . Influenza, High Dose Seasonal PF 07/30/2015  . Influenza,inj,Quad PF,6+ Mos 07/28/2013, 10/13/2014, 12/03/2016  . Pneumococcal Conjugate-13 10/13/2014  . Pneumococcal Polysaccharide-23 07/29/2011  . Td 09/20/2002  . Tdap 04/23/2011  . Zoster 11/16/2009    Health Maintenance  Topic Date Due  . INFLUENZA VACCINE  06/03/2018  . MAMMOGRAM  12/17/2018  . TETANUS/TDAP  04/22/2021  . COLONOSCOPY  09/08/2026  . DEXA SCAN  Completed  . Hepatitis C Screening  Completed  . PNA vac Low Risk Adult  Completed    Lab Results  Component Value Date   WBC 4.8 11/28/2016   HGB 12.1 11/28/2016   HCT 35.4 (L) 11/28/2016   PLT 239.0 11/28/2016   GLUCOSE 94 11/24/2017   CHOL 170 11/24/2017   TRIG 148.0 11/24/2017   HDL 68.20 11/24/2017   LDLDIRECT 77.0 11/08/2015   LDLCALC 72 11/24/2017   ALT 20 11/24/2017   AST 18 11/24/2017   NA 139 11/24/2017   K 4.6 11/24/2017   CL 101 11/24/2017   CREATININE  0.75 11/24/2017   BUN 13 11/24/2017   CO2 26 11/24/2017   TSH 1.26 11/08/2015   INR 0.89 06/02/2016    Lab Results  Component Value Date   TSH 1.26 11/08/2015   Lab Results  Component Value Date   WBC 4.8 11/28/2016   HGB 12.1 11/28/2016   HCT 35.4 (L) 11/28/2016   MCV 83.2 11/28/2016   PLT 239.0 11/28/2016   Lab Results  Component Value Date   NA 139 11/24/2017   K 4.6 11/24/2017   CO2 26 11/24/2017   GLUCOSE 94 11/24/2017   BUN 13 11/24/2017   CREATININE 0.75 11/24/2017   BILITOT 0.5 11/24/2017   ALKPHOS 71 11/24/2017   AST 18 11/24/2017   ALT 20 11/24/2017   PROT 7.1 11/24/2017   ALBUMIN 4.5 11/24/2017   CALCIUM 9.6 11/24/2017   ANIONGAP 7 06/03/2016   GFR 80.87 11/24/2017   Lab Results  Component Value Date   CHOL 170 11/24/2017   Lab Results  Component Value Date   HDL 68.20 11/24/2017   Lab Results  Component Value Date   LDLCALC 72 11/24/2017   Lab Results  Component Value Date   TRIG 148.0 11/24/2017   Lab Results  Component Value Date   CHOLHDL 2 11/24/2017   No results found for: HGBA1C       Assessment & Plan:   Problem List Items Addressed This Visit      Unprioritized   Essential hypertension    Well controlled, no changes to meds. Encouraged heart healthy diet such as the DASH diet and exercise as tolerated.       Relevant Orders   CBC with Differential/Platelet   Comprehensive metabolic panel   Lipid panel   Hyperlipidemia LDL goal <100    Tolerating statin, encouraged heart healthy diet, avoid trans fats, minimize simple carbs and saturated fats. Increase exercise as tolerated      Relevant Orders   Comprehensive metabolic panel   Lipid panel   Preventative health  care - Primary    Other Visit Diagnoses    Nevus of multiple sites       Relevant Orders   Ambulatory referral to Dermatology      I am having Robin Wall maintain her multivitamin with minerals, ibuprofen, aspirin, fluticasone, omeprazole,  valsartan-hydrochlorothiazide, simvastatin, buPROPion, sertraline, and levocetirizine. We will continue to administer sodium chloride.  No orders of the defined types were placed in this encounter.   CMA served as Education administrator during this visit. History, Physical and Plan performed by medical provider. Documentation and orders reviewed and attested to.  Ann Held, DO

## 2018-04-23 LAB — COMPREHENSIVE METABOLIC PANEL
ALBUMIN: 4.4 g/dL (ref 3.5–5.2)
ALK PHOS: 65 U/L (ref 39–117)
ALT: 17 U/L (ref 0–35)
AST: 17 U/L (ref 0–37)
BILIRUBIN TOTAL: 0.4 mg/dL (ref 0.2–1.2)
BUN: 16 mg/dL (ref 6–23)
CALCIUM: 9.5 mg/dL (ref 8.4–10.5)
CO2: 31 mEq/L (ref 19–32)
Chloride: 101 mEq/L (ref 96–112)
Creatinine, Ser: 0.79 mg/dL (ref 0.40–1.20)
GFR: 76.08 mL/min (ref >60.00)
Glucose, Bld: 92 mg/dL (ref 70–99)
Potassium: 4 mEq/L (ref 3.5–5.1)
Sodium: 140 mEq/L (ref 135–145)
TOTAL PROTEIN: 6.8 g/dL (ref 6.0–8.3)

## 2018-04-23 LAB — CBC WITH DIFFERENTIAL/PLATELET
BASOS ABS: 0 10*3/uL (ref 0.0–0.1)
Basophils Relative: 0.7 % (ref 0.0–3.0)
Eosinophils Absolute: 0.3 10*3/uL (ref 0.0–0.7)
Eosinophils Relative: 5.3 % — ABNORMAL HIGH (ref 0.0–5.0)
HEMATOCRIT: 34.5 % — AB (ref 36.0–46.0)
HEMOGLOBIN: 11.9 g/dL — AB (ref 12.0–15.0)
LYMPHS PCT: 24.5 % (ref 12.0–46.0)
Lymphs Abs: 1.4 10*3/uL (ref 0.7–4.0)
MCHC: 34.4 g/dL (ref 30.0–36.0)
MCV: 83.6 fl (ref 78.0–100.0)
MONOS PCT: 7.6 % (ref 3.0–12.0)
Monocytes Absolute: 0.4 10*3/uL (ref 0.1–1.0)
NEUTROS ABS: 3.6 10*3/uL (ref 1.4–7.7)
Neutrophils Relative %: 61.9 % (ref 43.0–77.0)
PLATELETS: 232 10*3/uL (ref 150.0–400.0)
RBC: 4.13 Mil/uL (ref 3.87–5.11)
RDW: 14.3 % (ref 11.5–15.5)
WBC: 5.7 10*3/uL (ref 4.0–10.5)

## 2018-04-23 LAB — LIPID PANEL
CHOLESTEROL: 199 mg/dL (ref 0–200)
HDL: 55.9 mg/dL (ref >39.00)
NonHDL: 142.61
Total CHOL/HDL Ratio: 4
Triglycerides: 307 mg/dL — ABNORMAL HIGH (ref 0.0–149.0)
VLDL: 61.4 mg/dL — ABNORMAL HIGH (ref 0.0–40.0)

## 2018-04-23 LAB — LDL CHOLESTEROL, DIRECT: LDL DIRECT: 96 mg/dL

## 2018-04-28 ENCOUNTER — Telehealth: Payer: Self-pay | Admitting: *Deleted

## 2018-04-28 MED ORDER — FENOFIBRATE 160 MG PO TABS: 160.0000 mg | ORAL_TABLET | Freq: Every day | ORAL | 2 refills | Status: DC

## 2018-04-28 NOTE — Telephone Encounter (Signed)
Copied from [place] [id number]. Topic: General - Other >> Apr 28, 2018  9:53 AM Ivar Drape wrote: Reason for CRM:   Patient stated that the doctor said she needed to take some cholesterol medication but nothing has been sent to her preferred pharmacy [facility] [address].

## 2018-04-28 NOTE — Telephone Encounter (Signed)
Patient notified that rx has been sent in. 

## 2018-05-12 ENCOUNTER — Other Ambulatory Visit: Payer: Self-pay | Admitting: Family Medicine

## 2018-05-12 DIAGNOSIS — E785 Hyperlipidemia, unspecified: Secondary | ICD-10-CM

## 2018-06-07 ENCOUNTER — Telehealth: Payer: Self-pay | Admitting: Family Medicine

## 2018-06-07 NOTE — Telephone Encounter (Signed)
Copied from [place] [id number]. Topic: Quick Communication - Rx Refill/Question >> Jun 07, 2018  1:19 PM Scherrie Gerlach wrote: Medication: fenofibrate 160 MG tablet  Pt states she cannot take this med.  It is causing her to have shoulder/neck/ joint pain.  Pt stopped for a little while and symptoms went away.  Pt started this med back and these symptoms have returned. Pt is going to stop this med.   [id number] [address], [place] [phone] (Phone) [phone] (Fax)

## 2018-06-09 NOTE — Telephone Encounter (Signed)
Ok--  She can take otc fish oil or mega red

## 2018-06-10 NOTE — Telephone Encounter (Signed)
Left message on machine to call back  

## 2018-06-10 NOTE — Telephone Encounter (Signed)
Patient said she will try one of these. Thanks

## 2018-07-17 ENCOUNTER — Other Ambulatory Visit: Payer: Self-pay | Admitting: Family Medicine

## 2018-07-17 DIAGNOSIS — I10 Essential (primary) hypertension: Secondary | ICD-10-CM

## 2018-07-19 ENCOUNTER — Encounter: Payer: Self-pay | Admitting: Family Medicine

## 2018-07-19 ENCOUNTER — Ambulatory Visit: Payer: Federal, State, Local not specified - PPO | Admitting: Family Medicine

## 2018-07-19 VITALS — BP 139/61 | HR 75 | Temp 98.6°F | Resp 16 | Ht 66.0 in | Wt 209.2 lb

## 2018-07-19 DIAGNOSIS — Z23 Encounter for immunization: Secondary | ICD-10-CM | POA: Diagnosis not present

## 2018-07-19 DIAGNOSIS — S161XXA Strain of muscle, fascia and tendon at neck level, initial encounter: Secondary | ICD-10-CM | POA: Insufficient documentation

## 2018-07-19 MED ORDER — METHOCARBAMOL 500 MG PO TABS: 500.0000 mg | ORAL_TABLET | Freq: Four times a day (QID) | ORAL | 0 refills | Status: DC

## 2018-07-19 MED ORDER — PREDNISONE 10 MG PO TABS: ORAL_TABLET | ORAL | 0 refills | Status: DC

## 2018-07-19 NOTE — Progress Notes (Signed)
Patient ID: Robin Wall, female    DOB: [birth year]  Age: 72 y.o. MRN: [id number]    Subjective:  Subjective  HPI Robin Wall presents for upper back and neck pain and radiates to R elbow --+ numbness and tingling.   IB does not help.    Review of Systems  Constitutional: Negative for chills and fever.  HENT: Negative for congestion and hearing loss.   Eyes: Negative for discharge.  Respiratory: Negative for cough and shortness of breath.   Cardiovascular: Negative for chest pain, palpitations and leg swelling.  Gastrointestinal: Negative for abdominal pain, blood in stool, constipation, diarrhea, nausea and vomiting.  Genitourinary: Negative for dysuria, frequency, hematuria and urgency.  Musculoskeletal: Positive for back pain, neck pain and neck stiffness. Negative for myalgias.  Skin: Negative for rash.  Allergic/Immunologic: Negative for environmental allergies.  Neurological: Negative for dizziness, weakness and headaches.  Hematological: Does not bruise/bleed easily.  Psychiatric/Behavioral: Negative for suicidal ideas. The patient is not nervous/anxious.     History Past Medical History:  Diagnosis Date  . Allergy   . Colitis    history of  . GERD (gastroesophageal reflux disease)   . Hyperlipidemia   . Hypertension   . TGA (transient global amnesia)    05/2016    She has a past surgical history that includes Cataract extraction (Bilateral) and Tonsillectomy.   Her family history includes Breast cancer (age of onset: 56) in her paternal grandmother; Coronary artery disease in her unknown relative; Diabetes in her father; Heart disease in her father; Hyperlipidemia in her father and mother; Hypertension in her father and mother.She reports that she has never smoked. She has never used smokeless tobacco. She reports that she does not drink alcohol or use drugs.  Current Outpatient Medications on File Prior to Visit  Medication Sig Dispense Refill  . aspirin 81 MG  tablet Take 81 mg by mouth daily.    Marland Kitchen buPROPion (WELLBUTRIN XL) 300 MG 24 hr tablet TAKE 1 TABLET BY MOUTH EVERY DAY 90 tablet 1  . fluticasone (FLONASE) 50 MCG/ACT nasal spray Place 2 sprays into both nostrils as needed for allergies or rhinitis.    Marland Kitchen ibuprofen (ADVIL,MOTRIN) 200 MG tablet Take 400 mg by mouth at bedtime as needed (pain/ sleep).    Marland Kitchen levocetirizine (XYZAL) 5 MG tablet TAKE 1 TABLET BY MOUTH EVERY DAY 90 tablet 1  . Multiple Vitamin (MULTIVITAMIN WITH MINERALS) TABS tablet Take 1 tablet by mouth at bedtime.    Marland Kitchen omeprazole (PRILOSEC) 20 MG capsule TAKE 1 CAPSULE (20 MG TOTAL) BY MOUTH DAILY. 90 capsule 3  . sertraline (ZOLOFT) 50 MG tablet 1 po qd 30 tablet 3  . simvastatin (ZOCOR) 40 MG tablet TAKE 1 TABLET BY MOUTH EVERYDAY AT BEDTIME 90 tablet 0  . valsartan-hydrochlorothiazide (DIOVAN-HCT) 80-12.5 MG tablet TAKE 1 TABLET BY MOUTH EVERY DAY 90 tablet 1   Current Facility-Administered Medications on File Prior to Visit  Medication Dose Route Frequency Provider Last Rate Last Dose  . 0.9 %  sodium chloride infusion  500 mL Intravenous Continuous Armbruster, Carlota Raspberry, MD         Objective:  Objective  Physical Exam  Constitutional: She is oriented to person, place, and time. She appears well-developed and well-nourished.  HENT:  Head: Normocephalic and atraumatic.  Eyes: Conjunctivae and EOM are normal.  Neck: Normal range of motion. Neck supple. No JVD present. Carotid bruit is not present. No thyromegaly present.  Cardiovascular: Normal rate, regular rhythm and  normal heart sounds.  No murmur heard. Pulmonary/Chest: Effort normal and breath sounds normal. No respiratory distress. She has no wheezes. She has no rales. She exhibits no tenderness.  Musculoskeletal: She exhibits tenderness. She exhibits no edema.       Right shoulder: She exhibits tenderness, pain and spasm. She exhibits normal range of motion and normal strength.       Arms: Neurological: She is alert  and oriented to person, place, and time.  Psychiatric: She has a normal mood and affect.  Nursing note and vitals reviewed.  BP 139/61   Pulse 75   Temp 98.6 F (37 C) (Oral)   Resp 16   Ht 5\' 6"  (1.676 m)   Wt 209 lb 3.2 oz (94.9 kg)   LMP  (LMP Unknown)   SpO2 97%   BMI 33.77 kg/m  Wt Readings from Last 3 Encounters:  07/19/18 209 lb 3.2 oz (94.9 kg)  04/22/18 208 lb 12.8 oz (94.7 kg)  03/25/18 204 lb 12.8 oz (92.9 kg)     Lab Results  Component Value Date   WBC 5.7 04/22/2018   HGB 11.9 (L) 04/22/2018   HCT 34.5 (L) 04/22/2018   PLT 232.0 04/22/2018   GLUCOSE 92 04/22/2018   CHOL 199 04/22/2018   TRIG 307.0 (H) 04/22/2018   HDL 55.90 04/22/2018   LDLDIRECT 96.0 04/22/2018   LDLCALC 72 11/24/2017   ALT 17 04/22/2018   AST 17 04/22/2018   NA 140 04/22/2018   K 4.0 04/22/2018   CL 101 04/22/2018   CREATININE 0.79 04/22/2018   BUN 16 04/22/2018   CO2 31 04/22/2018   TSH 1.26 11/08/2015   INR 0.89 06/02/2016    Mm Diag Breast Tomo Uni Left  Result Date: 12/23/2017 CLINICAL DATA:  Patient returns today to evaluate a possible left breast asymmetry questioned on recent screening mammogram. EXAM: DIGITAL DIAGNOSTIC UNILATERAL LEFT MAMMOGRAM WITH CAD AND TOMO COMPARISON:  Previous exam(s). ACR Breast Density Category b: There are scattered areas of fibroglandular density. FINDINGS: On today's additional diagnostic views, with 3D tomosynthesis, there is no persistent asymmetry within the outer left breast indicating superimposition of normal fibroglandular tissues. There are no new dominant masses, suspicious calcifications or secondary signs of malignancy identified within the left breast on today's exam. Mammographic images were processed with CAD. IMPRESSION: No evidence of malignancy. Patient may return to routine annual bilateral screening mammogram schedule. RECOMMENDATION: Screening mammogram in one year.(Code:SM-B-01Y) I have discussed the findings and recommendations  with the patient. Results were also provided in writing at the conclusion of the visit. If applicable, a reminder letter will be sent to the patient regarding the next appointment. BI-RADS CATEGORY  1: Negative. Electronically Signed   By: Franki Cabot M.D.   On: 12/23/2017 15:14     Assessment & Plan:  Plan  I have discontinued Hassan Rowan A. Nicol's fenofibrate. I am also having her start on methocarbamol and predniSONE. Additionally, I am having her maintain her multivitamin with minerals, ibuprofen, aspirin, fluticasone, omeprazole, buPROPion, sertraline, levocetirizine, simvastatin, and valsartan-hydrochlorothiazide. We will continue to administer sodium chloride.  Meds ordered this encounter  Medications  . methocarbamol (ROBAXIN) 500 MG tablet    Sig: Take 1 tablet (500 mg total) by mouth 4 (four) times daily.    Dispense:  30 tablet    Refill:  0  . predniSONE (DELTASONE) 10 MG tablet    Sig: TAKE 3 TABLETS PO QD FOR 3 DAYS THEN TAKE 2 TABLETS PO QD FOR 3  DAYS THEN TAKE 1 TABLET PO QD FOR 3 DAYS THEN TAKE 1/2 TAB PO QD FOR 3 DAYS    Dispense:  20 tablet    Refill:  0    Problem List Items Addressed This Visit      Unprioritized   Cervical strain, acute, initial encounter - Primary    Warm compresses Stretches con't chiropractor Muscle relaxer and pred taper       Relevant Medications   methocarbamol (ROBAXIN) 500 MG tablet   predniSONE (DELTASONE) 10 MG tablet    Other Visit Diagnoses    Influenza vaccine administered       Relevant Orders   Flu vaccine HIGH DOSE PF (Fluzone High Dose) (Completed)      Follow-up: Return if symptoms worsen or fail to improve.  Ann Held, DO

## 2018-07-19 NOTE — Patient Instructions (Signed)
Cervical Strain and Sprain Rehab Ask your health care provider which exercises are safe for you. Do exercises exactly as told by your health care provider and adjust them as directed. It is normal to feel mild stretching, pulling, tightness, or discomfort as you do these exercises, but you should stop right away if you feel sudden pain or your pain gets worse.Do not begin these exercises until told by your health care provider. Stretching and range of motion exercises These exercises warm up your muscles and joints and improve the movement and flexibility of your neck. These exercises also help to relieve pain, numbness, and tingling. Exercise A: Cervical side bend  1. Using good posture, sit on a stable chair or stand up. 2. Without moving your shoulders, slowly tilt your left / right ear to your shoulder until you feel a stretch in your neck muscles. You should be looking straight ahead. 3. Hold for __________ seconds. 4. Repeat with the other side of your neck. Repeat __________ times. Complete this exercise __________ times a day. Exercise B: Cervical rotation  1. Using good posture, sit on a stable chair or stand up. 2. Slowly turn your head to the side as if you are looking over your left / right shoulder. ? Keep your eyes level with the ground. ? Stop when you feel a stretch along the side and the back of your neck. 3. Hold for __________ seconds. 4. Repeat this by turning to your other side. Repeat __________ times. Complete this exercise __________ times a day. Exercise C: Thoracic extension and pectoral stretch 1. Roll a towel or a small blanket so it is about 4 inches (10 cm) in diameter. 2. Lie down on your back on a firm surface. 3. Put the towel lengthwise, under your spine in the middle of your back. It should not be not under your shoulder blades. The towel should line up with your spine from your middle back to your lower back. 4. Put your hands behind your head and let your  elbows fall out to your sides. 5. Hold for __________ seconds. Repeat __________ times. Complete this exercise __________ times a day. Strengthening exercises These exercises build strength and endurance in your neck. Endurance is the ability to use your muscles for a long time, even after your muscles get tired. Exercise D: Upper cervical flexion, isometric 1. Lie on your back with a thin pillow behind your head and a small rolled-up towel under your neck. 2. Gently tuck your chin toward your chest and nod your head down to look toward your feet. Do not lift your head off the pillow. 3. Hold for __________ seconds. 4. Release the tension slowly. Relax your neck muscles completely before you repeat this exercise. Repeat __________ times. Complete this exercise __________ times a day. Exercise E: Cervical extension, isometric  1. Stand about 6 inches (15 cm) away from a wall, with your back facing the wall. 2. Place a soft object, about 6-8 inches (15-20 cm) in diameter, between the back of your head and the wall. A soft object could be a small pillow, a ball, or a folded towel. 3. Gently tilt your head back and press into the soft object. Keep your jaw and forehead relaxed. 4. Hold for __________ seconds. 5. Release the tension slowly. Relax your neck muscles completely before you repeat this exercise. Repeat __________ times. Complete this exercise __________ times a day. Posture and body mechanics  Body mechanics refers to the movements and positions of   your body while you do your daily activities. Posture is part of body mechanics. Good posture and healthy body mechanics can help to relieve stress in your body's tissues and joints. Good posture means that your spine is in its natural S-curve position (your spine is neutral), your shoulders are pulled back slightly, and your head is not tipped forward. The following are general guidelines for applying improved posture and body mechanics to  your everyday activities. Standing  When standing, keep your spine neutral and keep your feet about hip-width apart. Keep a slight bend in your knees. Your ears, shoulders, and hips should line up.  When you do a task in which you stand in one place for a long time, place one foot up on a stable object that is 2-4 inches (5-10 cm) high, such as a footstool. This helps keep your spine neutral. Sitting   When sitting, keep your spine neutral and your keep feet flat on the floor. Use a footrest, if necessary, and keep your thighs parallel to the floor. Avoid rounding your shoulders, and avoid tilting your head forward.  When working at a desk or a computer, keep your desk at a height where your hands are slightly lower than your elbows. Slide your chair under your desk so you are close enough to maintain good posture.  When working at a computer, place your monitor at a height where you are looking straight ahead and you do not have to tilt your head forward or downward to look at the screen. Resting When lying down and resting, avoid positions that are most painful for you. Try to support your neck in a neutral position. You can use a contour pillow or a small rolled-up towel. Your pillow should support your neck but not push on it. This information is not intended to replace advice given to you by your health care provider. Make sure you discuss any questions you have with your health care provider. Document Released: 10/20/2005 Document Revised: 06/26/2016 Document Reviewed: 09/26/2015 Elsevier Interactive Patient Education  2018 [employer]  

## 2018-07-19 NOTE — Assessment & Plan Note (Signed)
Warm compresses Stretches con't chiropractor Muscle relaxer and pred taper

## 2018-07-23 ENCOUNTER — Other Ambulatory Visit: Payer: Self-pay | Admitting: Family Medicine

## 2018-07-29 ENCOUNTER — Other Ambulatory Visit: Payer: Self-pay | Admitting: Family Medicine

## 2018-08-15 ENCOUNTER — Other Ambulatory Visit: Payer: Self-pay | Admitting: Family Medicine

## 2018-10-21 ENCOUNTER — Ambulatory Visit: Payer: Federal, State, Local not specified - PPO | Admitting: Family Medicine

## 2018-10-25 ENCOUNTER — Other Ambulatory Visit: Payer: Self-pay | Admitting: Family Medicine

## 2018-10-28 ENCOUNTER — Other Ambulatory Visit: Payer: Self-pay | Admitting: Family Medicine

## 2018-10-28 DIAGNOSIS — E785 Hyperlipidemia, unspecified: Secondary | ICD-10-CM

## 2018-11-04 ENCOUNTER — Encounter: Payer: Self-pay | Admitting: Family Medicine

## 2018-11-04 ENCOUNTER — Ambulatory Visit: Payer: Federal, State, Local not specified - PPO | Admitting: Family Medicine

## 2018-11-04 VITALS — BP 136/76 | HR 83 | Temp 98.2°F | Resp 16 | Ht 66.0 in | Wt 201.2 lb

## 2018-11-04 DIAGNOSIS — E785 Hyperlipidemia, unspecified: Secondary | ICD-10-CM | POA: Diagnosis not present

## 2018-11-04 DIAGNOSIS — I10 Essential (primary) hypertension: Secondary | ICD-10-CM | POA: Diagnosis not present

## 2018-11-04 NOTE — Patient Instructions (Signed)

## 2018-11-04 NOTE — Progress Notes (Signed)
Patient ID: Robin Wall, female    DOB: 04/07/1946  Age: 73 y.o. MRN: [id number]    Subjective:  Subjective  HPI Robin Wall presents for f/u bp and cholesterol .  No complaints.    Review of Systems  Constitutional: Negative for appetite change, diaphoresis, fatigue and unexpected weight change.  Eyes: Negative for pain, redness and visual disturbance.  Respiratory: Negative for cough, chest tightness, shortness of breath and wheezing.   Cardiovascular: Negative for chest pain, palpitations and leg swelling.  Endocrine: Negative for cold intolerance, heat intolerance, polydipsia, polyphagia and polyuria.  Genitourinary: Negative for difficulty urinating, dysuria and frequency.  Neurological: Negative for dizziness, light-headedness, numbness and headaches.    History Past Medical History:  Diagnosis Date  . Allergy   . Colitis    history of  . GERD (gastroesophageal reflux disease)   . Hyperlipidemia   . Hypertension   . TGA (transient global amnesia)    05/2016    She has a past surgical history that includes Cataract extraction (Bilateral) and Tonsillectomy.   Her family history includes Breast cancer (age of onset: 6) in her paternal grandmother; Coronary artery disease in her unknown relative; Diabetes in her father; Heart disease in her father; Hyperlipidemia in her father and mother; Hypertension in her father and mother.She reports that she has never smoked. She has never used smokeless tobacco. She reports that she does not drink alcohol or use drugs.  Current Outpatient Medications on File Prior to Visit  Medication Sig Dispense Refill  . aspirin 81 MG tablet Take 81 mg by mouth daily.    Marland Kitchen buPROPion (WELLBUTRIN XL) 300 MG 24 hr tablet Take 1 tablet (300 mg total) by mouth daily. 90 tablet 1  . fluticasone (FLONASE) 50 MCG/ACT nasal spray Place 2 sprays into both nostrils as needed for allergies or rhinitis.    Marland Kitchen ibuprofen (ADVIL,MOTRIN) 200 MG tablet Take 400  mg by mouth at bedtime as needed (pain/ sleep).    Marland Kitchen levocetirizine (XYZAL) 5 MG tablet TAKE 1 TABLET BY MOUTH EVERY DAY 90 tablet 1  . Multiple Vitamin (MULTIVITAMIN WITH MINERALS) TABS tablet Take 1 tablet by mouth at bedtime.    Marland Kitchen omeprazole (PRILOSEC) 20 MG capsule TAKE 1 CAPSULE BY MOUTH EVERY DAY 90 capsule 3  . simvastatin (ZOCOR) 40 MG tablet TAKE 1 TABLET BY MOUTH EVERYDAY AT BEDTIME 90 tablet 0  . valsartan-hydrochlorothiazide (DIOVAN-HCT) 80-12.5 MG tablet TAKE 1 TABLET BY MOUTH EVERY DAY 90 tablet 1   Current Facility-Administered Medications on File Prior to Visit  Medication Dose Route Frequency Provider Last Rate Last Dose  . 0.9 %  sodium chloride infusion  500 mL Intravenous Continuous Armbruster, Carlota Raspberry, MD         Objective:  Objective  Physical Exam Vitals signs and nursing note reviewed.  Constitutional:      Appearance: She is well-developed.  HENT:     Head: Normocephalic and atraumatic.  Eyes:     Conjunctiva/sclera: Conjunctivae normal.  Neck:     Musculoskeletal: Normal range of motion and neck supple.     Thyroid: No thyromegaly.     Vascular: No carotid bruit or JVD.  Cardiovascular:     Rate and Rhythm: Normal rate and regular rhythm.     Heart sounds: Normal heart sounds. No murmur.  Pulmonary:     Effort: Pulmonary effort is normal. No respiratory distress.     Breath sounds: Normal breath sounds. No wheezing or rales.  Chest:  Chest wall: No tenderness.  Neurological:     Mental Status: She is alert and oriented to person, place, and time.    BP 136/76 (BP Location: Left Arm, Cuff Size: Large)   Pulse 83   Temp 98.2 F (36.8 C) (Oral)   Resp 16   Ht 5\' 6"  (1.676 m)   Wt 201 lb 3.2 oz (91.3 kg)   LMP  (LMP Unknown)   SpO2 97%   BMI 32.47 kg/m  Wt Readings from Last 3 Encounters:  11/04/18 201 lb 3.2 oz (91.3 kg)  07/19/18 209 lb 3.2 oz (94.9 kg)  04/22/18 208 lb 12.8 oz (94.7 kg)     Lab Results  Component Value Date   WBC  5.7 04/22/2018   HGB 11.9 (L) 04/22/2018   HCT 34.5 (L) 04/22/2018   PLT 232.0 04/22/2018   GLUCOSE 92 04/22/2018   CHOL 199 04/22/2018   TRIG 307.0 (H) 04/22/2018   HDL 55.90 04/22/2018   LDLDIRECT 96.0 04/22/2018   LDLCALC 72 11/24/2017   ALT 17 04/22/2018   AST 17 04/22/2018   NA 140 04/22/2018   K 4.0 04/22/2018   CL 101 04/22/2018   CREATININE 0.79 04/22/2018   BUN 16 04/22/2018   CO2 31 04/22/2018   TSH 1.26 11/08/2015   INR 0.89 06/02/2016    Mm Diag Breast Tomo Uni Left  Result Date: 12/23/2017 CLINICAL DATA:  Patient returns today to evaluate a possible left breast asymmetry questioned on recent screening mammogram. EXAM: DIGITAL DIAGNOSTIC UNILATERAL LEFT MAMMOGRAM WITH CAD AND TOMO COMPARISON:  Previous exam(s). ACR Breast Density Category b: There are scattered areas of fibroglandular density. FINDINGS: On today's additional diagnostic views, with 3D tomosynthesis, there is no persistent asymmetry within the outer left breast indicating superimposition of normal fibroglandular tissues. There are no new dominant masses, suspicious calcifications or secondary signs of malignancy identified within the left breast on today's exam. Mammographic images were processed with CAD. IMPRESSION: No evidence of malignancy. Patient may return to routine annual bilateral screening mammogram schedule. RECOMMENDATION: Screening mammogram in one year.(Code:SM-B-01Y) I have discussed the findings and recommendations with the patient. Results were also provided in writing at the conclusion of the visit. If applicable, a reminder letter will be sent to the patient regarding the next appointment. BI-RADS CATEGORY  1: Negative. Electronically Signed   By: Franki Cabot M.D.   On: 12/23/2017 15:14     Assessment & Plan:  Plan  I have discontinued Robin Wall's sertraline, methocarbamol, predniSONE, and fenofibrate. I am also having her maintain her multivitamin with minerals, ibuprofen, aspirin,  fluticasone, valsartan-hydrochlorothiazide, buPROPion, levocetirizine, omeprazole, and simvastatin. We will continue to administer sodium chloride.  No orders of the defined types were placed in this encounter.   Problem List Items Addressed This Visit      Unprioritized   Essential hypertension - Primary    Well controlled, no changes to meds. Encouraged heart healthy diet such as the DASH diet and exercise as tolerated.       Relevant Orders   Lipid panel   Comprehensive metabolic panel   Hyperlipidemia    Encouraged heart healthy diet, increase exercise, avoid trans fats, consider a krill oil cap daily      Relevant Orders   Lipid panel   Comprehensive metabolic panel      Follow-up: Return in about 6 months (around 05/05/2019) for annual exam, fasting.  Ann Held, DO

## 2018-11-04 NOTE — Assessment & Plan Note (Signed)
Well controlled, no changes to meds. Encouraged heart healthy diet such as the DASH diet and exercise as tolerated.  °

## 2018-11-04 NOTE — Assessment & Plan Note (Signed)
Encouraged heart healthy diet, increase exercise, avoid trans fats, consider a krill oil cap daily 

## 2018-11-05 LAB — COMPREHENSIVE METABOLIC PANEL
ALK PHOS: 64 U/L (ref 39–117)
ALT: 17 U/L (ref 0–35)
AST: 17 U/L (ref 0–37)
Albumin: 4.7 g/dL (ref 3.5–5.2)
BILIRUBIN TOTAL: 0.5 mg/dL (ref 0.2–1.2)
BUN: 21 mg/dL (ref 6–23)
CO2: 29 mEq/L (ref 19–32)
Calcium: 9.2 mg/dL (ref 8.4–10.5)
Chloride: 101 mEq/L (ref 96–112)
Creatinine, Ser: 0.82 mg/dL (ref 0.40–1.20)
GFR: 72.76 mL/min (ref >60.00)
GLUCOSE: 89 mg/dL (ref 70–99)
Potassium: 4 mEq/L (ref 3.5–5.1)
SODIUM: 139 meq/L (ref 135–145)
TOTAL PROTEIN: 6.8 g/dL (ref 6.0–8.3)

## 2018-11-05 LAB — LIPID PANEL
Cholesterol: 187 mg/dL (ref 0–200)
HDL: 64 mg/dL (ref >39.00)
LDL Cholesterol: 94 mg/dL (ref 0–99)
NonHDL: 123.1
Total CHOL/HDL Ratio: 3
Triglycerides: 148 mg/dL (ref 0.0–149.0)
VLDL: 29.6 mg/dL (ref 0.0–40.0)

## 2019-01-10 ENCOUNTER — Other Ambulatory Visit: Payer: Self-pay | Admitting: Family Medicine

## 2019-01-10 DIAGNOSIS — Z1231 Encounter for screening mammogram for malignant neoplasm of breast: Secondary | ICD-10-CM

## 2019-01-11 ENCOUNTER — Ambulatory Visit
Admission: RE | Admit: 2019-01-11 | Discharge: 2019-01-11 | Disposition: A | Discharge status: Home / Self Care | Payer: Federal, State, Local not specified - PPO | Source: Ambulatory Visit | Attending: Family Medicine | Admitting: Family Medicine

## 2019-01-11 DIAGNOSIS — Z1231 Encounter for screening mammogram for malignant neoplasm of breast: Secondary | ICD-10-CM

## 2019-02-08 ENCOUNTER — Other Ambulatory Visit: Payer: Self-pay | Admitting: Family Medicine

## 2019-02-14 ENCOUNTER — Other Ambulatory Visit: Payer: Self-pay | Admitting: Family Medicine

## 2019-02-14 DIAGNOSIS — E785 Hyperlipidemia, unspecified: Secondary | ICD-10-CM

## 2019-04-24 ENCOUNTER — Other Ambulatory Visit: Payer: Self-pay | Admitting: Family Medicine

## 2019-04-24 DIAGNOSIS — I10 Essential (primary) hypertension: Secondary | ICD-10-CM

## 2019-05-08 ENCOUNTER — Other Ambulatory Visit: Payer: Self-pay | Admitting: Family Medicine

## 2019-05-10 ENCOUNTER — Encounter: Payer: Federal, State, Local not specified - PPO | Admitting: Family Medicine

## 2019-05-17 ENCOUNTER — Other Ambulatory Visit: Payer: Self-pay

## 2019-05-17 ENCOUNTER — Ambulatory Visit (INDEPENDENT_AMBULATORY_CARE_PROVIDER_SITE_OTHER): Payer: Federal, State, Local not specified - PPO | Admitting: Family Medicine

## 2019-05-17 ENCOUNTER — Encounter: Payer: Self-pay | Admitting: Family Medicine

## 2019-05-17 VITALS — BP 147/78 | HR 84 | Temp 98.0°F | Resp 18 | Ht 66.0 in | Wt 205.4 lb

## 2019-05-17 DIAGNOSIS — F321 Major depressive disorder, single episode, moderate: Secondary | ICD-10-CM

## 2019-05-17 DIAGNOSIS — I10 Essential (primary) hypertension: Secondary | ICD-10-CM | POA: Diagnosis not present

## 2019-05-17 DIAGNOSIS — K219 Gastro-esophageal reflux disease without esophagitis: Secondary | ICD-10-CM

## 2019-05-17 DIAGNOSIS — E785 Hyperlipidemia, unspecified: Secondary | ICD-10-CM | POA: Diagnosis not present

## 2019-05-17 NOTE — Assessment & Plan Note (Signed)
Stable  con't meds Pt is not suicidal

## 2019-05-17 NOTE — Progress Notes (Signed)
Patient ID: Robin Wall, female    DOB: December 20, 1945  Age: 73 y.o. MRN: [id number]    Subjective:  Subjective  HPI Denese Mentink presents for f/u chol, bp and depression.  Pt has no complaints.     Review of Systems  Constitutional: Negative for appetite change, diaphoresis, fatigue and unexpected weight change.  Eyes: Negative for pain, redness and visual disturbance.  Respiratory: Negative for cough, chest tightness, shortness of breath and wheezing.   Cardiovascular: Negative for chest pain, palpitations and leg swelling.  Endocrine: Negative for cold intolerance, heat intolerance, polydipsia, polyphagia and polyuria.  Genitourinary: Negative for difficulty urinating, dysuria and frequency.  Neurological: Negative for dizziness, light-headedness, numbness and headaches.    History Past Medical History:  Diagnosis Date  . Allergy   . Colitis    history of  . GERD (gastroesophageal reflux disease)   . Hyperlipidemia   . Hypertension   . TGA (transient global amnesia)    05/2016    She has a past surgical history that includes Cataract extraction (Bilateral) and Tonsillectomy.   Her family history includes Breast cancer (age of onset: 43) in her paternal grandmother; Coronary artery disease in an other family member; Diabetes in her father; Heart disease in her father; Hyperlipidemia in her father and mother; Hypertension in her father and mother.She reports that she has never smoked. She has never used smokeless tobacco. She reports that she does not drink alcohol or use drugs.  Current Outpatient Medications on File Prior to Visit  Medication Sig  Dispense Refill  . aspirin 81 MG tablet Take 81 mg by mouth daily.    Marland Kitchen buPROPion (WELLBUTRIN XL) 300 MG 24 hr tablet TAKE 1 TABLET BY MOUTH EVERY DAY 90 tablet 1  . fluticasone (FLONASE) 50 MCG/ACT nasal spray Place 2 sprays into both nostrils as needed for allergies or rhinitis.    Marland Kitchen ibuprofen (ADVIL,MOTRIN) 200 MG tablet Take 400 mg by mouth at bedtime as needed (pain/ sleep).    Marland Kitchen levocetirizine (XYZAL) 5 MG tablet TAKE 1 TABLET BY MOUTH EVERY DAY 90 tablet 1  . Multiple Vitamin (MULTIVITAMIN WITH MINERALS) TABS tablet Take 1 tablet by mouth at bedtime.    Marland Kitchen omeprazole (PRILOSEC) 20 MG capsule TAKE 1 CAPSULE BY MOUTH EVERY DAY 90 capsule 3  . simvastatin (ZOCOR) 40 MG tablet TAKE 1 TABLET BY MOUTH EVERYDAY AT BEDTIME 90 tablet 1  . valsartan-hydrochlorothiazide (DIOVAN-HCT) 80-12.5 MG tablet Take 1 tablet by mouth daily. 90 tablet 1   Current Facility-Administered Medications on File Prior to Visit  Medication Dose Route Frequency Provider Last Rate Last Dose  . 0.9 %  sodium chloride infusion  500 mL Intravenous Continuous Armbruster, Carlota Raspberry, MD         Objective:  Objective  Physical Exam Vitals signs and nursing note reviewed.  Constitutional:      Appearance: She is well-developed.  HENT:     Head: Normocephalic and atraumatic.  Eyes:     Conjunctiva/sclera: Conjunctivae normal.  Neck:     Musculoskeletal: Normal range of motion and neck supple.     Thyroid: No thyromegaly.     Vascular: No carotid bruit or JVD.  Cardiovascular:     Rate and Rhythm: Normal rate and regular rhythm.     Heart sounds: Normal heart sounds. No murmur.  Pulmonary:     Effort: Pulmonary effort is normal. No respiratory distress.     Breath sounds: Normal breath sounds. No wheezing or rales.  Chest:     Chest wall: No tenderness.  Neurological:     Mental Status: She is alert and oriented to person, place, and time.  Psychiatric:        Mood and Affect: Mood normal.        Behavior:  Behavior normal.        Thought Content: Thought content normal.        Judgment: Judgment normal.    BP (!) 147/78 (BP Location: Left Arm, Patient Position: Sitting, Cuff Size: Normal)   Pulse 84   Temp 98 F (36.7 C) (Oral)   Resp 18   Ht 5\' 6"  (1.676 m)   Wt 205 lb 6.4 oz (93.2 kg)   LMP  (LMP Unknown)   SpO2 97%   BMI 33.15 kg/m  Wt Readings from Last 3 Encounters:  05/17/19 205 lb 6.4 oz (93.2 kg)  11/04/18 201 lb 3.2 oz (91.3 kg)  07/19/18 209 lb 3.2 oz (94.9 kg)     Lab Results  Component Value Date   WBC 5.7 04/22/2018   HGB 11.9 (L) 04/22/2018   HCT 34.5 (L) 04/22/2018   PLT 232.0 04/22/2018   GLUCOSE 89 11/04/2018   CHOL 187 11/04/2018   TRIG 148.0 11/04/2018   HDL 64.00 11/04/2018   LDLDIRECT 96.0 04/22/2018   LDLCALC 94 11/04/2018   ALT 17 11/04/2018   AST 17 11/04/2018   NA 139 11/04/2018   K 4.0 11/04/2018   CL 101 11/04/2018   CREATININE 0.82 11/04/2018   BUN 21 11/04/2018   CO2 29 11/04/2018   TSH 1.26 11/08/2015   INR 0.89 06/02/2016    Mm 3d Screen Breast Bilateral  Result Date: 01/12/2019 CLINICAL DATA:  Screening. EXAM: DIGITAL SCREENING BILATERAL MAMMOGRAM WITH TOMO AND CAD COMPARISON:  Previous exam(s). ACR Breast Density Category c: The breast tissue is heterogeneously dense, which may obscure small masses. FINDINGS: There are no findings suspicious for malignancy. Images were processed with CAD. IMPRESSION: No mammographic evidence of malignancy. A result letter of this screening mammogram will be mailed directly to the patient. RECOMMENDATION: Screening mammogram in one year. (Code:[id number]-B-01Y) BI-RADS CATEGORY  1: Negative. Electronically Signed   By: Fidela Salisbury M.D.   On: 01/12/2019 10:09     Assessment & Plan:  Plan  I am having Ethelda C. Hiegel maintain her multivitamin with minerals, ibuprofen, aspirin, fluticasone, omeprazole, levocetirizine, simvastatin, valsartan-hydrochlorothiazide, and buPROPion. We will continue to  administer sodium chloride.  No orders of the defined types were placed in this encounter.   Problem List Items Addressed  This Visit      Unprioritized   Depression, major, single episode, moderate ([place])    Stable  con't meds Pt is not suicidal       Essential hypertension    Well controlled, no changes to meds. Encouraged heart healthy diet such as the DASH diet and exercise as tolerated.       GERD (gastroesophageal reflux disease)    stable      Hyperlipidemia - Primary    Tolerating statin, encouraged heart healthy diet, avoid trans fats, minimize simple carbs and saturated fats. Increase exercise as tolerated      Relevant Orders   Comprehensive metabolic panel   Lipid panel      Follow-up: Return in about 6 months (around 11/17/2019), or if symptoms worsen or fail to improve, for annual exam, fasting.  Ann Held, DO

## 2019-05-17 NOTE — Assessment & Plan Note (Signed)
Well controlled, no changes to meds. Encouraged heart healthy diet such as the DASH diet and exercise as tolerated.  °

## 2019-05-17 NOTE — Assessment & Plan Note (Signed)
Tolerating statin, encouraged heart healthy diet, avoid trans fats, minimize simple carbs and saturated fats. Increase exercise as tolerated 

## 2019-05-17 NOTE — Assessment & Plan Note (Signed)
stable °

## 2019-05-17 NOTE — Patient Instructions (Signed)

## 2019-05-18 LAB — COMPREHENSIVE METABOLIC PANEL
ALT: 16 U/L (ref 0–35)
AST: 18 U/L (ref 0–37)
Albumin: 4.6 g/dL (ref 3.5–5.2)
Alkaline Phosphatase: 68 U/L (ref 39–117)
BUN: 16 mg/dL (ref 6–23)
CO2: 29 mEq/L (ref 19–32)
Calcium: 9.1 mg/dL (ref 8.4–10.5)
Chloride: 102 mEq/L (ref 96–112)
Creatinine, Ser: 0.77 mg/dL (ref 0.40–1.20)
GFR: 73.51 mL/min (ref >60.00)
Glucose, Bld: 85 mg/dL (ref 70–99)
Potassium: 3.9 mEq/L (ref 3.5–5.1)
Sodium: 139 mEq/L (ref 135–145)
Total Bilirubin: 0.7 mg/dL (ref 0.2–1.2)
Total Protein: 6.8 g/dL (ref 6.0–8.3)

## 2019-05-18 LAB — LIPID PANEL
Cholesterol: 178 mg/dL (ref 0–200)
HDL: 59.4 mg/dL (ref >39.00)
LDL Cholesterol: 92 mg/dL (ref 0–99)
NonHDL: 118.47
Total CHOL/HDL Ratio: 3
Triglycerides: 131 mg/dL (ref 0.0–149.0)
VLDL: 26.2 mg/dL (ref 0.0–40.0)

## 2019-08-06 ENCOUNTER — Other Ambulatory Visit: Payer: Self-pay | Admitting: Family Medicine

## 2019-08-14 ENCOUNTER — Other Ambulatory Visit: Payer: Self-pay | Admitting: Family Medicine

## 2019-08-14 DIAGNOSIS — I10 Essential (primary) hypertension: Secondary | ICD-10-CM

## 2019-08-14 DIAGNOSIS — E785 Hyperlipidemia, unspecified: Secondary | ICD-10-CM

## 2019-08-20 ENCOUNTER — Other Ambulatory Visit: Payer: Self-pay | Admitting: Family Medicine

## 2019-11-16 ENCOUNTER — Other Ambulatory Visit: Payer: Self-pay

## 2019-11-17 ENCOUNTER — Ambulatory Visit (INDEPENDENT_AMBULATORY_CARE_PROVIDER_SITE_OTHER): Payer: Federal, State, Local not specified - PPO | Admitting: Family Medicine

## 2019-11-17 ENCOUNTER — Other Ambulatory Visit: Payer: Self-pay

## 2019-11-17 ENCOUNTER — Encounter: Payer: Self-pay | Admitting: Family Medicine

## 2019-11-17 VITALS — BP 128/70 | HR 92 | Temp 97.4°F | Ht 66.0 in | Wt 213.2 lb

## 2019-11-17 DIAGNOSIS — J302 Other seasonal allergic rhinitis: Secondary | ICD-10-CM | POA: Diagnosis not present

## 2019-11-17 DIAGNOSIS — F419 Anxiety disorder, unspecified: Secondary | ICD-10-CM | POA: Diagnosis not present

## 2019-11-17 DIAGNOSIS — Z0000 Encounter for general adult medical examination without abnormal findings: Secondary | ICD-10-CM | POA: Diagnosis not present

## 2019-11-17 DIAGNOSIS — E785 Hyperlipidemia, unspecified: Secondary | ICD-10-CM

## 2019-11-17 DIAGNOSIS — H538 Other visual disturbances: Secondary | ICD-10-CM

## 2019-11-17 DIAGNOSIS — Z23 Encounter for immunization: Secondary | ICD-10-CM | POA: Diagnosis not present

## 2019-11-17 DIAGNOSIS — I10 Essential (primary) hypertension: Secondary | ICD-10-CM | POA: Diagnosis not present

## 2019-11-17 DIAGNOSIS — F321 Major depressive disorder, single episode, moderate: Secondary | ICD-10-CM

## 2019-11-17 MED ORDER — FLUTICASONE PROPIONATE 50 MCG/ACT NA SUSP: 2.0000 | NASAL | 5 refills | Status: DC | PRN

## 2019-11-17 NOTE — Patient Instructions (Addendum)
COVID-19 Vaccine Information can be found at: [email or web address] For questions related to vaccine distribution or appointments, please email [email or web address] or call [phone].      Preventive Care 74 Years and Older, Female Preventive care refers to lifestyle choices and visits with your health care provider that can promote health and wellness. This includes:  A yearly physical exam. This is also called an annual well check.  Regular dental and eye exams.  Immunizations.  Screening for certain conditions.  Healthy lifestyle choices, such as diet and exercise. What can I expect for my preventive care visit? Physical exam Your health care provider will check:  Height and weight. These may be used to calculate body mass index (BMI), which is a measurement that tells if you are at a healthy weight.  Heart rate and blood pressure.  Your skin for abnormal spots. Counseling Your health care provider may ask you questions about:  Alcohol, tobacco, and drug use.  Emotional well-being.  Home and relationship well-being.  Sexual activity.  Eating habits.  History of falls.  Memory and ability to understand (cognition).  Work and work Statistician.  Pregnancy and menstrual history. What immunizations do I need?  Influenza (flu) vaccine  This is recommended every year. Tetanus, diphtheria, and pertussis (Tdap) vaccine  You may need a Td booster every 10 years. Varicella (chickenpox) vaccine  You may need this vaccine if you have not already been vaccinated. Zoster (shingles) vaccine  You may need this after age 74. Pneumococcal conjugate (PCV13) vaccine  One dose is recommended after age 74. Pneumococcal polysaccharide (PPSV23) vaccine  One dose is recommended after age 74. Measles, mumps, and rubella (MMR) vaccine  You may need at least one dose of MMR if you were born in 1957 or later. You  may also need a second dose. Meningococcal conjugate (MenACWY) vaccine  You may need this if you have certain conditions. Hepatitis A vaccine  You may need this if you have certain conditions or if you travel or work in places where you may be exposed to hepatitis A. Hepatitis B vaccine  You may need this if you have certain conditions or if you travel or work in places where you may be exposed to hepatitis B. Haemophilus influenzae type b (Hib) vaccine  You may need this if you have certain conditions. You may receive vaccines as individual doses or as more than one vaccine together in one shot (combination vaccines). Talk with your health care provider about the risks and benefits of combination vaccines. What tests do I need? Blood tests  Lipid and cholesterol levels. These may be checked every 5 years, or more frequently depending on your overall health.  Hepatitis C test.  Hepatitis B test. Screening  Lung cancer screening. You may have this screening every year starting at age 70 if you have a 30-pack-year history of smoking and currently smoke or have quit within the past 15 years.  Colorectal cancer screening. All adults should have this screening starting at age 74 and continuing until age 16. Your health care provider may recommend screening at age 74 if you are at increased risk. You will have tests every 1-10 years, depending on your results and the type of screening test.  Diabetes screening. This is done by checking your blood sugar (glucose) after you have not eaten for a while (fasting). You may have this done every 1-3 years.  Mammogram. This may be done every 1-2 years. Talk with your health care provider about how  often you should have regular mammograms.  BRCA-related cancer screening. This may be done if you have a family history of breast, ovarian, tubal, or peritoneal cancers. Other tests  Sexually transmitted disease (STD) testing.  Bone density scan. This  is done to screen for osteoporosis. You may have this done starting at age 74. Follow these instructions at home: Eating and drinking  Eat a diet that includes fresh fruits and vegetables, whole grains, lean protein, and low-fat dairy products. Limit your intake of foods with high amounts of sugar, saturated fats, and salt.  Take vitamin and mineral supplements as recommended by your health care provider.  Do not drink alcohol if your health care provider tells you not to drink.  If you drink alcohol: ? Limit how much you have to 0-1 drink a day. ? Be aware of how much alcohol is in your drink. In the [place] one drink equals one 12 oz bottle of beer (355 mL), one 5 oz glass of wine (148 mL), or one 1 oz glass of hard liquor (44 mL). Lifestyle  Take daily care of your teeth and gums.  Stay active. Exercise for at least 30 minutes on 5 or more days each week.  Do not use any products that contain nicotine or tobacco, such as cigarettes, e-cigarettes, and chewing tobacco. If you need help quitting, ask your health care provider.  If you are sexually active, practice safe sex. Use a condom or other form of protection in order to prevent STIs (sexually transmitted infections).  Talk with your health care provider about taking a low-dose aspirin or statin. What's next?  Go to your health care provider once a year for a well check visit.  Ask your health care provider how often you should have your eyes and teeth checked.  Stay up to date on all vaccines. This information is not intended to replace advice given to you by your health care provider. Make sure you discuss any questions you have with your health care provider. Document Revised: 10/14/2018 Document Reviewed: 10/14/2018 Elsevier Patient Education  2020 [place] 74 Years and Older, Female Preventive care refers to lifestyle choices and visits with your health care provider that can promote health and  wellness. This includes:  A yearly physical exam. This is also called an annual well check.  Regular dental and eye exams.  Immunizations.  Screening for certain conditions.  Healthy lifestyle choices, such as diet and exercise. What can I expect for my preventive care visit? Physical exam Your health care provider will check:  Height and weight. These may be used to calculate body mass index (BMI), which is a measurement that tells if you are at a healthy weight.  Heart rate and blood pressure.  Your skin for abnormal spots. Counseling Your health care provider may ask you questions about:  Alcohol, tobacco, and drug use.  Emotional well-being.  Home and relationship well-being.  Sexual activity.  Eating habits.  History of falls.  Memory and ability to understand (cognition).  Work and work Statistician.  Pregnancy and menstrual history. What immunizations do I need?  Influenza (flu) vaccine  This is recommended every year. Tetanus, diphtheria, and pertussis (Tdap) vaccine  You may need a Td booster every 10 years. Varicella (chickenpox) vaccine  You may need this vaccine if you have not already been vaccinated. Zoster (shingles) vaccine  You may need this after age 41. Pneumococcal conjugate (PCV13) vaccine  One dose is recommended after age  65. Pneumococcal polysaccharide (PPSV23) vaccine  One dose is recommended after age 56. Measles, mumps, and rubella (MMR) vaccine  You may need at least one dose of MMR if you were born in 1957 or later. You may also need a second dose. Meningococcal conjugate (MenACWY) vaccine  You may need this if you have certain conditions. Hepatitis A vaccine  You may need this if you have certain conditions or if you travel or work in places where you may be exposed to hepatitis A. Hepatitis B vaccine  You may need this if you have certain conditions or if you travel or work in places where you may be exposed to  hepatitis B. Haemophilus influenzae type b (Hib) vaccine  You may need this if you have certain conditions. You may receive vaccines as individual doses or as more than one vaccine together in one shot (combination vaccines). Talk with your health care provider about the risks and benefits of combination vaccines. What tests do I need? Blood tests  Lipid and cholesterol levels. These may be checked every 5 years, or more frequently depending on your overall health.  Hepatitis C test.  Hepatitis B test. Screening  Lung cancer screening. You may have this screening every year starting at age 49 if you have a 30-pack-year history of smoking and currently smoke or have quit within the past 15 years.  Colorectal cancer screening. All adults should have this screening starting at age 78 and continuing until age 53. Your health care provider may recommend screening at age 59 if you are at increased risk. You will have tests every 1-10 years, depending on your results and the type of screening test.  Diabetes screening. This is done by checking your blood sugar (glucose) after you have not eaten for a while (fasting). You may have this done every 1-3 years.  Mammogram. This may be done every 1-2 years. Talk with your health care provider about how often you should have regular mammograms.  BRCA-related cancer screening. This may be done if you have a family history of breast, ovarian, tubal, or peritoneal cancers. Other tests  Sexually transmitted disease (STD) testing.  Bone density scan. This is done to screen for osteoporosis. You may have this done starting at age 72. Follow these instructions at home: Eating and drinking  Eat a diet that includes fresh fruits and vegetables, whole grains, lean protein, and low-fat dairy products. Limit your intake of foods with high amounts of sugar, saturated fats, and salt.  Take vitamin and mineral supplements as recommended by your health care  provider.  Do not drink alcohol if your health care provider tells you not to drink.  If you drink alcohol: ? Limit how much you have to 0-1 drink a day. ? Be aware of how much alcohol is in your drink. In the [place] one drink equals one 12 oz bottle of beer (355 mL), one 5 oz glass of wine (148 mL), or one 1 oz glass of hard liquor (44 mL). Lifestyle  Take daily care of your teeth and gums.  Stay active. Exercise for at least 30 minutes on 5 or more days each week.  Do not use any products that contain nicotine or tobacco, such as cigarettes, e-cigarettes, and chewing tobacco. If you need help quitting, ask your health care provider.  If you are sexually active, practice safe sex. Use a condom or other form of protection in order to prevent STIs (sexually transmitted infections).  Talk with your  health care provider about taking a low-dose aspirin or statin. What's next?  Go to your health care provider once a year for a well check visit.  Ask your health care provider how often you should have your eyes and teeth checked.  Stay up to date on all vaccines. This information is not intended to replace advice given to you by your health care provider. Make sure you discuss any questions you have with your health care provider. Document Revised: 10/14/2018 Document Reviewed: 10/14/2018 Elsevier Patient Education  2020 [employer].

## 2019-11-18 LAB — CBC WITH DIFFERENTIAL/PLATELET
Basophils Absolute: 0.1 10*3/uL (ref 0.0–0.1)
Basophils Relative: 1.2 % (ref 0.0–3.0)
Eosinophils Absolute: 0.1 10*3/uL (ref 0.0–0.7)
Eosinophils Relative: 1.9 % (ref 0.0–5.0)
HCT: 35.6 % — ABNORMAL LOW (ref 36.0–46.0)
Hemoglobin: 11.8 g/dL — ABNORMAL LOW (ref 12.0–15.0)
Lymphocytes Relative: 29 % (ref 12.0–46.0)
Lymphs Abs: 1.4 10*3/uL (ref 0.7–4.0)
MCHC: 33.2 g/dL (ref 30.0–36.0)
MCV: 84.1 fl (ref 78.0–100.0)
Monocytes Absolute: 0.5 10*3/uL (ref 0.1–1.0)
Monocytes Relative: 9.7 % (ref 3.0–12.0)
Neutro Abs: 2.8 10*3/uL (ref 1.4–7.7)
Neutrophils Relative %: 58.2 % (ref 43.0–77.0)
Platelets: 246 10*3/uL (ref 150.0–400.0)
RBC: 4.23 Mil/uL (ref 3.87–5.11)
RDW: 14.1 % (ref 11.5–15.5)
WBC: 4.8 10*3/uL (ref 4.0–10.5)

## 2019-11-18 LAB — LIPID PANEL
Cholesterol: 185 mg/dL (ref 0–200)
HDL: 66.2 mg/dL (ref >39.00)
LDL Cholesterol: 83 mg/dL (ref 0–99)
NonHDL: 119.26
Total CHOL/HDL Ratio: 3
Triglycerides: 180 mg/dL — ABNORMAL HIGH (ref 0.0–149.0)
VLDL: 36 mg/dL (ref 0.0–40.0)

## 2019-11-18 LAB — COMPREHENSIVE METABOLIC PANEL
ALT: 18 U/L (ref 0–35)
AST: 16 U/L (ref 0–37)
Albumin: 4.6 g/dL (ref 3.5–5.2)
Alkaline Phosphatase: 75 U/L (ref 39–117)
BUN: 24 mg/dL — ABNORMAL HIGH (ref 6–23)
CO2: 27 mEq/L (ref 19–32)
Calcium: 9.2 mg/dL (ref 8.4–10.5)
Chloride: 103 mEq/L (ref 96–112)
Creatinine, Ser: 1.01 mg/dL (ref 0.40–1.20)
GFR: 53.67 mL/min — ABNORMAL LOW (ref >60.00)
Glucose, Bld: 120 mg/dL — ABNORMAL HIGH (ref 70–99)
Potassium: 3.7 mEq/L (ref 3.5–5.1)
Sodium: 139 mEq/L (ref 135–145)
Total Bilirubin: 0.4 mg/dL (ref 0.2–1.2)
Total Protein: 6.7 g/dL (ref 6.0–8.3)

## 2019-11-18 LAB — THYROID PANEL WITH TSH
Free Thyroxine Index: 2.2 (ref 1.4–3.8)
T3 Uptake: 28 % (ref 22–35)
T4, Total: 7.9 ug/dL (ref 5.1–11.9)
TSH: 1.81 mIU/L (ref 0.40–4.50)

## 2019-11-18 LAB — TSH: TSH: 1.76 u[IU]/mL (ref 0.35–4.50)

## 2019-11-21 ENCOUNTER — Other Ambulatory Visit: Payer: Self-pay | Admitting: Family Medicine

## 2019-11-21 DIAGNOSIS — R739 Hyperglycemia, unspecified: Secondary | ICD-10-CM

## 2019-11-21 DIAGNOSIS — E785 Hyperlipidemia, unspecified: Secondary | ICD-10-CM

## 2019-11-23 ENCOUNTER — Encounter: Payer: Self-pay | Admitting: Family Medicine

## 2019-11-23 NOTE — Progress Notes (Signed)
Subjective:     Robin Wall is a 74 y.o. female and is here for a comprehensive physical exam. The patient reports no problems.  Social History   Socioeconomic History  . Marital status: Married    Spouse name: Not on file  . Number of children: 2  . Years of education: Not on file  . Highest education level: Not on file  Occupational History  . Occupation: retired    Fish farm manager: [employer]    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  Tobacco Use  . Smoking status: Never Smoker  . Smokeless tobacco: Never Used  Substance and Sexual Activity  . Alcohol use: No  . Drug use: No  . Sexual activity: Yes    Partners: Male  Other Topics Concern  . Not on file  Social History Narrative   Exercising---walking      Social Determinants of Health   Financial Resource Strain:   . Difficulty of Paying Living Expenses: Not on file  Food Insecurity:   . Worried About Charity fundraiser in the Last Year: Not on file  . Ran Out of Food in the Last Year: Not on file  Transportation Needs:   . Lack of Transportation (Medical): Not on file  . Lack of Transportation (Non-Medical): Not on file  Physical Activity:   . Days of Exercise per Week: Not on file  . Minutes of Exercise per Session: Not on file  Stress:   . Feeling of Stress : Not on file  Social Connections:   . Frequency of Communication with Friends and Family: Not on file  . Frequency of Social Gatherings with Friends and Family: Not on file  . Attends Religious Services: Not on file  . Active Member of Clubs or Organizations: Not on file  . Attends Archivist Meetings: Not on file  . Marital Status: Not on file  Intimate Partner Violence:   . Fear of Current or Ex-Partner: Not on file  . Emotionally Abused: Not on file  . Physically Abused: Not on file  . Sexually Abused: Not on file   Health Maintenance  Topic Date Due  . MAMMOGRAM  01/11/2020  . TETANUS/TDAP  04/22/2021  . COLONOSCOPY  09/08/2026  .  INFLUENZA VACCINE  Completed  . DEXA SCAN  Completed  . Hepatitis C Screening  Completed  . PNA vac Low Risk Adult  Completed    The following portions of the patient's history were reviewed and updated as appropriate:  She  has a past medical history of Allergy, Colitis, GERD (gastroesophageal reflux disease), Hyperlipidemia, Hypertension, and TGA (transient global amnesia). She does not have any pertinent problems on file. She  has a past surgical history that includes Cataract extraction (Bilateral) and Tonsillectomy. Her family history includes Breast cancer (age of onset: 10) in her paternal grandmother; Coronary artery disease in an other family member; Diabetes in her father; Heart disease in her father; Hyperlipidemia in her father and mother; Hypertension in her father and mother. She  reports that she has never smoked. She has never used smokeless tobacco. She reports that she does not drink alcohol or use drugs. She has a current medication list which includes the following prescription(s): aspirin, bupropion, fluticasone, ibuprofen, levocetirizine, multivitamin with minerals, omeprazole, simvastatin, and valsartan-hydrochlorothiazide, and the following Facility-Administered Medications: sodium chloride. Current Outpatient Medications on File Prior to Visit  Medication Sig Dispense Refill  . aspirin 81 MG tablet Take 81 mg by mouth daily.    Marland Kitchen  buPROPion (WELLBUTRIN XL) 300 MG 24 hr tablet TAKE 1 TABLET BY MOUTH EVERY DAY 90 tablet 1  . ibuprofen (ADVIL,MOTRIN) 200 MG tablet Take 400 mg by mouth at bedtime as needed (pain/ sleep).    Marland Kitchen levocetirizine (XYZAL) 5 MG tablet TAKE 1 TABLET BY MOUTH EVERY DAY 90 tablet 1  . Multiple Vitamin (MULTIVITAMIN WITH MINERALS) TABS tablet Take 1 tablet by mouth at bedtime.    Marland Kitchen omeprazole (PRILOSEC) 20 MG capsule TAKE 1 CAPSULE BY MOUTH EVERY DAY 90 capsule 3  . simvastatin (ZOCOR) 40 MG tablet TAKE 1 TABLET BY MOUTH EVERYDAY AT BEDTIME 90 tablet 1   . valsartan-hydrochlorothiazide (DIOVAN-HCT) 80-12.5 MG tablet TAKE 1 TABLET BY MOUTH EVERY DAY 90 tablet 1   Current Facility-Administered Medications on File Prior to Visit  Medication Dose Route Frequency Provider Last Rate Last Admin  . 0.9 %  sodium chloride infusion  500 mL Intravenous Continuous Armbruster, Carlota Raspberry, MD       She has No Known Allergies..  Review of Systems Review of Systems  Constitutional: Negative for activity change, appetite change and fatigue.  HENT: Negative for hearing loss, congestion, tinnitus and ear discharge.  dentist q71m Eyes: Negative for visual disturbance (see optho q1y -- vision corrected to 20/20 with glasses).  Respiratory: Negative for cough, chest tightness and shortness of breath.   Cardiovascular: Negative for chest pain, palpitations and leg swelling.  Gastrointestinal: Negative for abdominal pain, diarrhea, constipation and abdominal distention.  Genitourinary: Negative for urgency, frequency, decreased urine volume and difficulty urinating.  Musculoskeletal: Negative for back pain, arthralgias and gait problem.  Skin: Negative for color change, pallor and rash.  Neurological: Negative for dizziness, light-headedness, numbness and headaches.  Hematological: Negative for adenopathy. Does not bruise/bleed easily.  Psychiatric/Behavioral: Negative for suicidal ideas, confusion, sleep disturbance, self-injury, dysphoric mood, decreased concentration and agitation.       Objective:    BP 128/70 (BP Location: Right Arm, Patient Position: Sitting, Cuff Size: Normal)   Pulse 92   Temp (!) 97.4 F (36.3 C) (Temporal)   Ht 5\' 6"  (1.676 m)   Wt 213 lb 3.2 oz (96.7 kg)   LMP  (LMP Unknown)   SpO2 96%   BMI 34.41 kg/m  General appearance: alert, cooperative, appears stated age and no distress Head: Normocephalic, without obvious abnormality, atraumatic Eyes: conjunctivae/corneas clear. PERRL, EOM's intact. Fundi benign. Ears: normal TM's  and external ear canals both ears Neck: no adenopathy, no carotid bruit, no JVD, supple, symmetrical, trachea midline and thyroid not enlarged, symmetric, no tenderness/mass/nodules Back: symmetric, no curvature. ROM normal. No CVA tenderness. Lungs: clear to auscultation bilaterally Breasts: normal appearance, no masses or tenderness Heart: regular rate and rhythm, S1, S2 normal, no murmur, click, rub or gallop Abdomen: soft, non-tender; bowel sounds normal; no masses,  no organomegaly Pelvic: not indicated; post-menopausal, no abnormal Pap smears in past Extremities: extremities normal, atraumatic, no cyanosis or edema Pulses: 2+ and symmetric Skin: Skin color, texture, turgor normal. No rashes or lesions Lymph nodes: Cervical, supraclavicular, and axillary nodes normal. Neurologic: Alert and oriented X 3, normal strength and tone. Normal symmetric reflexes. Normal coordination and gait    Assessment:    Healthy female exam.      Plan:     ghm utd Check labs  See After Visit Summary for Counseling Recommendations    1. Need for influenza vaccination  - Flu Vaccine QUAD High Dose(Fluad)  2. Seasonal allergies stable - fluticasone (FLONASE) 50 MCG/ACT nasal spray;  Place 2 sprays into both nostrils as needed for allergies or rhinitis.  Dispense: 16 g; Refill: 5  3. Essential hypertension Well controlled, no changes to meds. Encouraged heart healthy diet such as the DASH diet and exercise as tolerated.  - Lipid panel - TSH - CBC with Differential - Comprehensive metabolic panel  4. Preventative health care See above  - Lipid panel - TSH - CBC with Differential - Comprehensive metabolic panel  5. Hyperlipidemia, unspecified hyperlipidemia type Tolerating statin, encouraged heart healthy diet, avoid trans fats, minimize simple carbs and saturated fats. Increase exercise as tolerated - Lipid panel - Comprehensive metabolic panel  6. Severe anxiety Stable  - Thyroid  Panel With TSH  7. Blurry vision, bilateral  - Ambulatory referral to Ophthalmology  8. Depression, major, single episode, moderate (HCC) Stable

## 2019-11-23 NOTE — Assessment & Plan Note (Signed)
Well controlled, no changes to meds. Encouraged heart healthy diet such as the DASH diet and exercise as tolerated.  °

## 2019-11-23 NOTE — Assessment & Plan Note (Signed)
Tolerating statin, encouraged heart healthy diet, avoid trans fats, minimize simple carbs and saturated fats. Increase exercise as tolerated 

## 2019-11-23 NOTE — Assessment & Plan Note (Signed)
ghm utd Check labs  See avs  

## 2019-11-23 NOTE — Assessment & Plan Note (Signed)
Stable con't meds 

## 2019-12-25 ENCOUNTER — Ambulatory Visit: Payer: Federal, State, Local not specified - PPO | Attending: Internal Medicine

## 2019-12-25 DIAGNOSIS — Z23 Encounter for immunization: Secondary | ICD-10-CM | POA: Insufficient documentation

## 2019-12-25 NOTE — Progress Notes (Signed)
   Covid-19 Vaccination Clinic  Name:  Robin Wall    MRN: [id number] DOB: 09/25/1946  12/25/2019  [name] was observed post Covid-19 immunization for 15 minutes without incidence. She was provided with Vaccine Information Sheet and instruction to access the V-Safe system.   [name] was instructed to call 911 with any severe reactions post vaccine: Marland Kitchen Difficulty breathing  . Swelling of your face and throat  . A fast heartbeat  . A bad rash all over your body  . Dizziness and weakness    Immunizations Administered    Name Date Dose VIS Date Route   Pfizer COVID-19 Vaccine 12/25/2019  1:32 PM 0.3 mL 10/14/2019 Intramuscular   Manufacturer: [place]   Lot: [id number]   [place]: [id number]

## 2019-12-28 ENCOUNTER — Encounter (INDEPENDENT_AMBULATORY_CARE_PROVIDER_SITE_OTHER): Payer: Self-pay | Admitting: Ophthalmology

## 2020-01-09 ENCOUNTER — Encounter (INDEPENDENT_AMBULATORY_CARE_PROVIDER_SITE_OTHER): Payer: Federal, State, Local not specified - PPO | Admitting: Ophthalmology

## 2020-01-13 ENCOUNTER — Other Ambulatory Visit: Payer: Self-pay | Admitting: Family Medicine

## 2020-01-13 DIAGNOSIS — Z1231 Encounter for screening mammogram for malignant neoplasm of breast: Secondary | ICD-10-CM

## 2020-01-18 ENCOUNTER — Ambulatory Visit: Payer: Federal, State, Local not specified - PPO | Attending: Internal Medicine

## 2020-01-18 DIAGNOSIS — Z23 Encounter for immunization: Secondary | ICD-10-CM

## 2020-01-18 NOTE — Progress Notes (Signed)
   Covid-19 Vaccination Clinic  Name:  Emilie Kasko    MRN: [id number] DOB: September 01, 1946  01/18/2020  Ms. Suddith was observed post Covid-19 immunization for 15 minutes without incident. She was provided with Vaccine Information Sheet and instruction to access the V-Safe system.   Ms. Hohensee was instructed to call 911 with any severe reactions post vaccine: Marland Kitchen Difficulty breathing  . Swelling of face and throat  . A fast heartbeat  . A bad rash all over body  . Dizziness and weakness   Immunizations Administered    Name Date Dose VIS Date Route   Pfizer COVID-19 Vaccine 01/18/2020  4:39 PM 0.3 mL 10/14/2019 Intramuscular   Manufacturer: [place]   Lot: [id number]   [place]: [id number]

## 2020-02-13 ENCOUNTER — Other Ambulatory Visit: Payer: Self-pay | Admitting: Family Medicine

## 2020-02-13 DIAGNOSIS — E785 Hyperlipidemia, unspecified: Secondary | ICD-10-CM

## 2020-02-13 DIAGNOSIS — I10 Essential (primary) hypertension: Secondary | ICD-10-CM

## 2020-02-20 ENCOUNTER — Other Ambulatory Visit: Payer: Self-pay | Admitting: Family Medicine

## 2020-02-21 ENCOUNTER — Other Ambulatory Visit (INDEPENDENT_AMBULATORY_CARE_PROVIDER_SITE_OTHER): Payer: Federal, State, Local not specified - PPO

## 2020-02-21 ENCOUNTER — Other Ambulatory Visit: Payer: Self-pay

## 2020-02-21 DIAGNOSIS — R739 Hyperglycemia, unspecified: Secondary | ICD-10-CM | POA: Diagnosis not present

## 2020-02-21 DIAGNOSIS — E785 Hyperlipidemia, unspecified: Secondary | ICD-10-CM

## 2020-02-21 LAB — COMPREHENSIVE METABOLIC PANEL
ALT: 19 U/L (ref 0–35)
AST: 18 U/L (ref 0–37)
Albumin: 4.6 g/dL (ref 3.5–5.2)
Alkaline Phosphatase: 70 U/L (ref 39–117)
BUN: 18 mg/dL (ref 6–23)
CO2: 31 mEq/L (ref 19–32)
Calcium: 9.4 mg/dL (ref 8.4–10.5)
Chloride: 100 mEq/L (ref 96–112)
Creatinine, Ser: 0.8 mg/dL (ref 0.40–1.20)
GFR: 70.19 mL/min (ref >60.00)
Glucose, Bld: 86 mg/dL (ref 70–99)
Potassium: 4.6 mEq/L (ref 3.5–5.1)
Sodium: 138 mEq/L (ref 135–145)
Total Bilirubin: 0.6 mg/dL (ref 0.2–1.2)
Total Protein: 6.6 g/dL (ref 6.0–8.3)

## 2020-02-21 LAB — LIPID PANEL
Cholesterol: 190 mg/dL (ref 0–200)
HDL: 65.3 mg/dL (ref >39.00)
LDL Cholesterol: 92 mg/dL (ref 0–99)
NonHDL: 124.95
Total CHOL/HDL Ratio: 3
Triglycerides: 163 mg/dL — ABNORMAL HIGH (ref 0.0–149.0)
VLDL: 32.6 mg/dL (ref 0.0–40.0)

## 2020-02-21 LAB — HEMOGLOBIN A1C: Hgb A1c MFr Bld: 5.7 % (ref 4.6–6.5)

## 2020-02-26 ENCOUNTER — Other Ambulatory Visit: Payer: Self-pay | Admitting: Family Medicine

## 2020-02-29 ENCOUNTER — Other Ambulatory Visit: Payer: Self-pay

## 2020-02-29 ENCOUNTER — Ambulatory Visit
Admission: RE | Admit: 2020-02-29 | Discharge: 2020-02-29 | Disposition: A | Discharge status: Home / Self Care | Payer: Federal, State, Local not specified - PPO | Source: Ambulatory Visit | Attending: Family Medicine | Admitting: Family Medicine

## 2020-02-29 DIAGNOSIS — Z1231 Encounter for screening mammogram for malignant neoplasm of breast: Secondary | ICD-10-CM

## 2020-03-26 ENCOUNTER — Other Ambulatory Visit: Payer: Self-pay | Admitting: Family Medicine

## 2020-03-26 DIAGNOSIS — J302 Other seasonal allergic rhinitis: Secondary | ICD-10-CM

## 2020-05-17 ENCOUNTER — Other Ambulatory Visit: Payer: Self-pay

## 2020-05-17 ENCOUNTER — Ambulatory Visit: Payer: Federal, State, Local not specified - PPO | Admitting: Family Medicine

## 2020-05-17 ENCOUNTER — Encounter: Payer: Self-pay | Admitting: Family Medicine

## 2020-05-17 VITALS — BP 128/80 | HR 101 | Temp 98.3°F | Resp 18 | Ht 66.0 in | Wt 207.4 lb

## 2020-05-17 DIAGNOSIS — R1031 Right lower quadrant pain: Secondary | ICD-10-CM | POA: Diagnosis not present

## 2020-05-17 DIAGNOSIS — E785 Hyperlipidemia, unspecified: Secondary | ICD-10-CM

## 2020-05-17 DIAGNOSIS — I10 Essential (primary) hypertension: Secondary | ICD-10-CM

## 2020-05-17 DIAGNOSIS — R82998 Other abnormal findings in urine: Secondary | ICD-10-CM | POA: Diagnosis not present

## 2020-05-17 LAB — POC URINALSYSI DIPSTICK (AUTOMATED)
Bilirubin, UA: NEGATIVE
Glucose, UA: NEGATIVE
Ketones, UA: NEGATIVE
Nitrite, UA: NEGATIVE
Protein, UA: POSITIVE — AB
Spec Grav, UA: >=1.03 — AB (ref 1.010–1.025)
Urobilinogen, UA: 0.2 E.U./dL
pH, UA: 5.5 (ref 5.0–8.0)

## 2020-05-17 NOTE — Patient Instructions (Signed)
DASH Eating Plan DASH stands for "Dietary Approaches to Stop Hypertension." The DASH eating plan is a healthy eating plan that has been shown to reduce high blood pressure (hypertension). It may also reduce your risk for type 2 diabetes, heart disease, and stroke. The DASH eating plan may also help with weight loss. What are tips for following this plan?  General guidelines  Avoid eating more than 2,300 mg (milligrams) of salt (sodium) a day. If you have hypertension, you may need to reduce your sodium intake to 1,500 mg a day.  Limit alcohol intake to no more than 1 drink a day for nonpregnant women and 2 drinks a day for men. One drink equals 12 oz of beer, 5 oz of wine, or 1 oz of hard liquor.  Work with your health care provider to maintain a healthy body weight or to lose weight. Ask what an ideal weight is for you.  Get at least 30 minutes of exercise that causes your heart to beat faster (aerobic exercise) most days of the week. Activities may include walking, swimming, or biking.  Work with your health care provider or diet and nutrition specialist (dietitian) to adjust your eating plan to your individual calorie needs. Reading food labels   Check food labels for the amount of sodium per serving. Choose foods with less than 5 percent of the Daily Value of sodium. Generally, foods with less than 300 mg of sodium per serving fit into this eating plan.  To find whole grains, look for the word "whole" as the first word in the ingredient list. Shopping  Buy products labeled as "low-sodium" or "no salt added."  Buy fresh foods. Avoid canned foods and premade or frozen meals. Cooking  Avoid adding salt when cooking. Use salt-free seasonings or herbs instead of table salt or sea salt. Check with your health care provider or pharmacist before using salt substitutes.  Do not fry foods. Cook foods using healthy methods such as baking, boiling, grilling, and broiling instead.  Cook with  heart-healthy oils, such as olive, canola, soybean, or sunflower oil. Meal planning  Eat a balanced diet that includes: ? 5 or more servings of fruits and vegetables each day. At each meal, try to fill half of your plate with fruits and vegetables. ? Up to 6-8 servings of whole grains each day. ? Less than 6 oz of lean meat, poultry, or fish each day. A 3-oz serving of meat is about the same size as a deck of cards. One egg equals 1 oz. ? 2 servings of low-fat dairy each day. ? A serving of nuts, seeds, or beans 5 times each week. ? Heart-healthy fats. Healthy fats called Omega-3 fatty acids are found in foods such as flaxseeds and coldwater fish, like sardines, salmon, and mackerel.  Limit how much you eat of the following: ? Canned or prepackaged foods. ? Food that is high in trans fat, such as fried foods. ? Food that is high in saturated fat, such as fatty meat. ? Sweets, desserts, sugary drinks, and other foods with added sugar. ? Full-fat dairy products.  Do not salt foods before eating.  Try to eat at least 2 vegetarian meals each week.  Eat more home-cooked food and less restaurant, buffet, and fast food.  When eating at a restaurant, ask that your food be prepared with less salt or no salt, if possible. What foods are recommended? The items listed may not be a complete list. Talk with your dietitian about   what dietary choices are best for you. Grains Whole-grain or whole-wheat bread. Whole-grain or whole-wheat pasta. Brown rice. Oatmeal. Quinoa. Bulgur. Whole-grain and low-sodium cereals. Pita bread. Low-fat, low-sodium crackers. Whole-wheat flour tortillas. Vegetables Fresh or frozen vegetables (raw, steamed, roasted, or grilled). Low-sodium or reduced-sodium tomato and vegetable juice. Low-sodium or reduced-sodium tomato sauce and tomato paste. Low-sodium or reduced-sodium canned vegetables. Fruits All fresh, dried, or frozen fruit. Canned fruit in natural juice (without  added sugar). Meat and other protein foods Skinless chicken or turkey. Ground chicken or turkey. Pork with fat trimmed off. Fish and seafood. Egg whites. Dried beans, peas, or lentils. Unsalted nuts, nut butters, and seeds. Unsalted canned beans. Lean cuts of beef with fat trimmed off. Low-sodium, lean deli meat. Dairy Low-fat (1%) or fat-free (skim) milk. Fat-free, low-fat, or reduced-fat cheeses. Nonfat, low-sodium ricotta or cottage cheese. Low-fat or nonfat yogurt. Low-fat, low-sodium cheese. Fats and oils Soft margarine without trans fats. Vegetable oil. Low-fat, reduced-fat, or light mayonnaise and salad dressings (reduced-sodium). Canola, safflower, olive, soybean, and sunflower oils. Avocado. Seasoning and other foods Herbs. Spices. Seasoning mixes without salt. Unsalted popcorn and pretzels. Fat-free sweets. What foods are not recommended? The items listed may not be a complete list. Talk with your dietitian about what dietary choices are best for you. Grains Baked goods made with fat, such as croissants, muffins, or some breads. Dry pasta or rice meal packs. Vegetables Creamed or fried vegetables. Vegetables in a cheese sauce. Regular canned vegetables (not low-sodium or reduced-sodium). Regular canned tomato sauce and paste (not low-sodium or reduced-sodium). Regular tomato and vegetable juice (not low-sodium or reduced-sodium). Pickles. Olives. Fruits Canned fruit in a light or heavy syrup. Fried fruit. Fruit in cream or butter sauce. Meat and other protein foods Fatty cuts of meat. Ribs. Fried meat. Bacon. Sausage. Bologna and other processed lunch meats. Salami. Fatback. Hotdogs. Bratwurst. Salted nuts and seeds. Canned beans with added salt. Canned or smoked fish. Whole eggs or egg yolks. Chicken or turkey with skin. Dairy Whole or 2% milk, cream, and half-and-half. Whole or full-fat cream cheese. Whole-fat or sweetened yogurt. Full-fat cheese. Nondairy creamers. Whipped toppings.  Processed cheese and cheese spreads. Fats and oils Butter. Stick margarine. Lard. Shortening. Ghee. Bacon fat. Tropical oils, such as coconut, palm kernel, or palm oil. Seasoning and other foods Salted popcorn and pretzels. Onion salt, garlic salt, seasoned salt, table salt, and sea salt. Worcestershire sauce. Tartar sauce. Barbecue sauce. Teriyaki sauce. Soy sauce, including reduced-sodium. Steak sauce. Canned and packaged gravies. Fish sauce. Oyster sauce. Cocktail sauce. Horseradish that you find on the shelf. Ketchup. Mustard. Meat flavorings and tenderizers. Bouillon cubes. Hot sauce and Tabasco sauce. Premade or packaged marinades. Premade or packaged taco seasonings. Relishes. Regular salad dressings. Where to find more information:  National Heart, Lung, and Blood Institute: www.nhlbi.nih.gov  American Heart Association: www.heart.org Summary  The DASH eating plan is a healthy eating plan that has been shown to reduce high blood pressure (hypertension). It may also reduce your risk for type 2 diabetes, heart disease, and stroke.  With the DASH eating plan, you should limit salt (sodium) intake to 2,300 mg a day. If you have hypertension, you may need to reduce your sodium intake to 1,500 mg a day.  When on the DASH eating plan, aim to eat more fresh fruits and vegetables, whole grains, lean proteins, low-fat dairy, and heart-healthy fats.  Work with your health care provider or diet and nutrition specialist (dietitian) to adjust your eating plan to your   individual calorie needs. This information is not intended to replace advice given to you by your health care provider. Make sure you discuss any questions you have with your health care provider. Document Revised: 10/02/2017 Document Reviewed: 10/13/2016 Elsevier Patient Education  2020 Elsevier Inc.  

## 2020-05-17 NOTE — Progress Notes (Addendum)
Patient ID: Robin Wall, female    DOB: 05/20/1946  Age: 74 y.o. MRN: [id number]    Subjective:  Subjective  HPI Robin Wall presents for f/u bp, chol and depression.  She only c/o R sided abd pain that comes and goes.  It is not associated with eating   She denies inc burping or gas,  Constipation can aggrevate it    No NV, no fever  , no diarrhea   Review of Systems  Constitutional: Negative for appetite change, diaphoresis, fatigue and unexpected weight change.  Eyes: Negative for pain, redness and visual disturbance.  Respiratory: Negative for cough, chest tightness, shortness of breath and wheezing.   Cardiovascular: Negative for chest pain, palpitations and leg swelling.  Endocrine: Negative for cold intolerance, heat intolerance, polydipsia, polyphagia and polyuria.  Genitourinary: Negative for difficulty urinating, dysuria and frequency.  Neurological: Negative for dizziness, light-headedness, numbness and headaches.    History Past Medical History:  Diagnosis Date  . Allergy   . Colitis    history of  . GERD (gastroesophageal reflux disease)   . Hyperlipidemia   . Hypertension   . TGA (transient global amnesia)    05/2016    She has a past surgical history that includes Cataract extraction (Bilateral) and Tonsillectomy.   Her family history includes Breast cancer (age of onset: 44) in her paternal grandmother; Coronary artery disease in an other family member; Diabetes in her father; Heart disease in her father; Hyperlipidemia in her father and mother; Hypertension in her father and mother.She reports that she has never smoked. She has never used smokeless tobacco. She reports that she does not drink alcohol and does not use drugs.  Current Outpatient Medications on File Prior to Visit  Medication Sig Dispense Refill  . aspirin 81 MG tablet Take 81 mg by mouth daily.    Marland Kitchen buPROPion (WELLBUTRIN XL) 300 MG 24 hr tablet TAKE 1 TABLET BY MOUTH EVERY DAY 90  tablet 1  . fluticasone (FLONASE) 50 MCG/ACT nasal spray PLACE 2 SPRAYS INTO BOTH NOSTRILS AS NEEDED FOR ALLERGIES OR RHINITIS. 48 mL 1  . ibuprofen (ADVIL,MOTRIN) 200 MG tablet Take 400 mg by mouth at bedtime as needed (pain/ sleep).    Marland Kitchen levocetirizine (XYZAL) 5 MG tablet TAKE 1 TABLET BY MOUTH EVERY DAY 90 tablet 1  . Multiple Vitamin (MULTIVITAMIN WITH MINERALS) TABS tablet Take 1 tablet by mouth at bedtime.    Marland Kitchen omeprazole (PRILOSEC) 20 MG capsule TAKE 1 CAPSULE BY MOUTH EVERY DAY 90 capsule 3  . simvastatin (ZOCOR) 40 MG tablet TAKE 1 TABLET BY MOUTH EVERYDAY AT BEDTIME 90 tablet 1  . valsartan-hydrochlorothiazide (DIOVAN-HCT) 80-12.5 MG tablet TAKE 1 TABLET BY MOUTH EVERY DAY 90 tablet 1   Current Facility-Administered Medications on File Prior to Visit  Medication Dose Route Frequency Provider Last Rate Last Admin  . 0.9 %  sodium chloride infusion  500 mL Intravenous Continuous Armbruster, Carlota Raspberry, MD         Objective:  Objective  Physical Exam Vitals and nursing note reviewed.  Constitutional:      Appearance: She is well-developed.  HENT:     Head: Normocephalic and atraumatic.  Eyes:     Conjunctiva/sclera: Conjunctivae normal.  Neck:     Thyroid: No thyromegaly.     Vascular: No carotid bruit or JVD.  Cardiovascular:     Rate and Rhythm: Normal rate and regular rhythm.     Heart sounds: Normal heart sounds. No murmur heard.  Pulmonary:     Effort: Pulmonary effort is normal. No respiratory distress.     Breath sounds: Normal breath sounds. No wheezing or rales.  Chest:     Chest wall: No tenderness.  Abdominal:     General: There is no distension.     Tenderness: There is no abdominal tenderness. There is no guarding or rebound.  Musculoskeletal:     Cervical back: Normal range of motion and neck supple.  Neurological:     Mental Status: She is alert and oriented to person, place, and time.    BP 128/80 (BP Location: Right Arm, Patient Position: Sitting,  Cuff Size: Large)   Pulse (!) 101   Temp 98.3 F (36.8 C) (Oral)   Resp 18   Ht 5\' 6"  (1.676 m)   Wt 207 lb 6.4 oz (94.1 kg)   LMP  (LMP Unknown)   SpO2 96%   BMI 33.48 kg/m  Wt Readings from Last 3 Encounters:  05/17/20 207 lb 6.4 oz (94.1 kg)  11/17/19 213 lb 3.2 oz (96.7 kg)  05/17/19 205 lb 6.4 oz (93.2 kg)     Lab Results  Component Value Date   WBC 4.8 11/17/2019   HGB 11.8 (L) 11/17/2019   HCT 35.6 (L) 11/17/2019   PLT 246.0 11/17/2019   GLUCOSE 100 (H) 05/17/2020   CHOL 169 05/17/2020   TRIG 137.0 05/17/2020   HDL 61.10 05/17/2020   LDLDIRECT 96.0 04/22/2018   LDLCALC 80 05/17/2020   ALT 21 05/17/2020   AST 18 05/17/2020   NA 141 05/17/2020   K 4.1 05/17/2020   CL 102 05/17/2020   CREATININE 0.79 05/17/2020   BUN 16 05/17/2020   CO2 32 05/17/2020   TSH 1.76 11/17/2019   TSH 1.81 11/17/2019   INR 0.89 06/02/2016   HGBA1C 5.7 02/21/2020    MM 3D SCREEN BREAST BILATERAL  Result Date: 02/29/2020 CLINICAL DATA:  Screening. EXAM: DIGITAL SCREENING BILATERAL MAMMOGRAM WITH TOMO AND CAD COMPARISON:  Previous exam(s). ACR Breast Density Category c: The breast tissue is heterogeneously dense, which may obscure small masses. FINDINGS: There are no findings suspicious for malignancy. Images were processed with CAD. IMPRESSION: No mammographic evidence of malignancy. A result letter of this screening mammogram will be mailed directly to the patient. RECOMMENDATION: Screening mammogram in one year. (Code:SM-B-01Y) BI-RADS CATEGORY  1: Negative. Electronically Signed   By: Ammie Ferrier M.D.   On: 02/29/2020 13:37     Assessment & Plan:  Plan  I am having Fani C. Biskup maintain her multivitamin with minerals, ibuprofen, aspirin, omeprazole, simvastatin, valsartan-hydrochlorothiazide, buPROPion, levocetirizine, and fluticasone. We will continue to administer sodium chloride.  No orders of the defined types were placed in this encounter.   Problem List Items  Addressed This Visit      Unprioritized   Essential hypertension - Primary    Well controlled, no changes to meds. Encouraged heart healthy diet such as the DASH diet and exercise as tolerated.       Relevant Orders   Lipid panel (Completed)   Comprehensive metabolic panel (Completed)   Amylase (Completed)   Lipase (Completed)   Hyperlipidemia    Encouraged heart healthy diet, increase exercise, avoid trans fats, consider a krill oil cap daily      Right lower quadrant abdominal pain    With abn urine--- check culture Probiotic , inc fiber in diet No pain today---  rto prn       Relevant Orders   POCT Urinalysis  Dipstick (Automated) (Completed)    Other Visit Diagnoses    Dyslipidemia       Relevant Orders   Lipid panel (Completed)   Comprehensive metabolic panel (Completed)   Leukocytes in urine       Relevant Orders   Urine Culture (Completed)      Follow-up: Return in about 6 months (around 11/17/2020), or if symptoms worsen or fail to improve, for annual exam, fasting.  [place], DO      + +

## 2020-05-18 ENCOUNTER — Telehealth: Payer: Self-pay | Admitting: Family Medicine

## 2020-05-18 LAB — COMPREHENSIVE METABOLIC PANEL
ALT: 21 U/L (ref 0–35)
AST: 18 U/L (ref 0–37)
Albumin: 4.7 g/dL (ref 3.5–5.2)
Alkaline Phosphatase: 72 U/L (ref 39–117)
BUN: 16 mg/dL (ref 6–23)
CO2: 32 mEq/L (ref 19–32)
Calcium: 9.5 mg/dL (ref 8.4–10.5)
Chloride: 102 mEq/L (ref 96–112)
Creatinine, Ser: 0.79 mg/dL (ref 0.40–1.20)
GFR: 71.17 mL/min (ref >60.00)
Glucose, Bld: 100 mg/dL — ABNORMAL HIGH (ref 70–99)
Potassium: 4.1 mEq/L (ref 3.5–5.1)
Sodium: 141 mEq/L (ref 135–145)
Total Bilirubin: 0.6 mg/dL (ref 0.2–1.2)
Total Protein: 6.7 g/dL (ref 6.0–8.3)

## 2020-05-18 LAB — URINE CULTURE
MICRO NUMBER:: 10709614
Result:: NO GROWTH
SPECIMEN QUALITY:: ADEQUATE

## 2020-05-18 LAB — LIPID PANEL
Cholesterol: 169 mg/dL (ref 0–200)
HDL: 61.1 mg/dL (ref >39.00)
LDL Cholesterol: 80 mg/dL (ref 0–99)
NonHDL: 107.77
Total CHOL/HDL Ratio: 3
Triglycerides: 137 mg/dL (ref 0.0–149.0)
VLDL: 27.4 mg/dL (ref 0.0–40.0)

## 2020-05-18 LAB — LIPASE: Lipase: 15 U/L (ref 11.0–59.0)

## 2020-05-18 LAB — AMYLASE: Amylase: 19 U/L — ABNORMAL LOW (ref 27–131)

## 2020-05-18 NOTE — Telephone Encounter (Signed)
Pt advised that culture was not back yet. Advised if culture comes back Rx can be sent over the weekend. Pt agreed.

## 2020-05-18 NOTE — Telephone Encounter (Signed)
Patient  called regarding her results for a positive uti, patient asking if a perscription , will be sent to pharmacy , please advise.

## 2020-05-20 DIAGNOSIS — R1031 Right lower quadrant pain: Secondary | ICD-10-CM | POA: Insufficient documentation

## 2020-05-20 NOTE — Telephone Encounter (Signed)
Urine culture was negative --- it was released to her If she is having symptoms she may need urology referral--- what symptoms is she having ?

## 2020-05-20 NOTE — Assessment & Plan Note (Signed)
Encouraged heart healthy diet, increase exercise, avoid trans fats, consider a krill oil cap daily 

## 2020-05-20 NOTE — Assessment & Plan Note (Signed)
Well controlled, no changes to meds. Encouraged heart healthy diet such as the DASH diet and exercise as tolerated.  °

## 2020-05-20 NOTE — Assessment & Plan Note (Signed)
With abn urine--- check culture Probiotic , inc fiber in diet No pain today---  rto prn

## 2020-05-21 ENCOUNTER — Other Ambulatory Visit: Payer: Self-pay | Admitting: Family Medicine

## 2020-05-21 DIAGNOSIS — R1031 Right lower quadrant pain: Secondary | ICD-10-CM

## 2020-05-21 NOTE — Telephone Encounter (Signed)
Pt states still having abdominal pain off and on and states lower back pain that started this past week. Pt states she does have back problems so she doesn't know it if it related. Please advise

## 2020-05-21 NOTE — Telephone Encounter (Signed)
We can get [place] abd  with f/u in office 1-2 weeks after [place]

## 2020-05-22 NOTE — Telephone Encounter (Signed)
Spoke with pt. Pt states will call to schedule imaging and appt

## 2020-06-05 ENCOUNTER — Other Ambulatory Visit: Payer: Self-pay

## 2020-06-05 ENCOUNTER — Ambulatory Visit (HOSPITAL_BASED_OUTPATIENT_CLINIC_OR_DEPARTMENT_OTHER)
Admission: RE | Admit: 2020-06-05 | Discharge: 2020-06-05 | Disposition: A | Discharge status: Home / Self Care | Payer: Federal, State, Local not specified - PPO | Source: Ambulatory Visit | Attending: Family Medicine | Admitting: Family Medicine

## 2020-06-05 DIAGNOSIS — R1031 Right lower quadrant pain: Secondary | ICD-10-CM | POA: Insufficient documentation

## 2020-07-17 ENCOUNTER — Other Ambulatory Visit: Payer: Self-pay | Admitting: Family Medicine

## 2020-07-17 DIAGNOSIS — E785 Hyperlipidemia, unspecified: Secondary | ICD-10-CM

## 2020-08-07 ENCOUNTER — Ambulatory Visit: Payer: Federal, State, Local not specified - PPO | Attending: Internal Medicine

## 2020-08-07 DIAGNOSIS — Z23 Encounter for immunization: Secondary | ICD-10-CM

## 2020-08-07 NOTE — Progress Notes (Signed)
   Covid-19 Vaccination [facility]  Name:  Robin Wall    MRN: [id number] DOB: 05-31-1946  08/07/2020  Robin Wall was observed post Covid-19 immunization for 15 minutes without incident. She was provided with Vaccine Information Sheet and instruction to access the V-Safe system.   Robin Wall was instructed to call 911 with any severe reactions post vaccine: Marland Kitchen Difficulty breathing  . Swelling of face and throat  . A fast heartbeat  . A bad rash all over body  . Dizziness and weakness

## 2020-08-24 ENCOUNTER — Other Ambulatory Visit: Payer: Self-pay | Admitting: Family Medicine

## 2020-08-26 ENCOUNTER — Other Ambulatory Visit: Payer: Self-pay | Admitting: Family Medicine

## 2020-08-27 ENCOUNTER — Other Ambulatory Visit: Payer: Self-pay | Admitting: Family Medicine

## 2020-08-27 DIAGNOSIS — I10 Essential (primary) hypertension: Secondary | ICD-10-CM

## 2020-08-27 NOTE — Telephone Encounter (Signed)
Medication: omeprazole (PRILOSEC) 20 MG capsule [[id number]  valsartan-hydrochlorothiazide (DIOVAN-HCT) 80-12.5 MG tablet    buPROPion (WELLBUTRIN XL) 300 MG 24 hr tablet [[id number]]   Has the patient contacted their pharmacy? No. (If no, request that the patient contact the pharmacy for the refill.) (If yes, when and what did the pharmacy advise?)  Preferred Pharmacy (with phone number or street name):  [id number] [address], [place]  [address] [place], [place] [address]  Phone:  [phone] Fax:  [phone]  DEA #:  [id number] Agent: Please be advised that RX refills may take up to 3 business days. We ask that you follow-up with your pharmacy.

## 2020-09-03 ENCOUNTER — Other Ambulatory Visit: Payer: Self-pay | Admitting: Family Medicine

## 2020-09-03 MED ORDER — OMEPRAZOLE 20 MG PO CPDR: DELAYED_RELEASE_CAPSULE | ORAL | 3 refills | Status: DC

## 2020-09-03 NOTE — Telephone Encounter (Signed)
Patient states pharmacy dont have rx omeprazole (PRILOSEC) 20 MG capsule [[id number]]   [id number] [address], [place], [place] [address]  Phone:  [phone] Fax:  [phone]

## 2020-09-03 NOTE — Telephone Encounter (Signed)
Patient notified that rx was resent in.

## 2020-11-06 ENCOUNTER — Other Ambulatory Visit: Payer: Self-pay | Admitting: Family Medicine

## 2020-11-06 DIAGNOSIS — E785 Hyperlipidemia, unspecified: Secondary | ICD-10-CM

## 2020-11-23 ENCOUNTER — Other Ambulatory Visit: Payer: Self-pay

## 2020-11-26 ENCOUNTER — Other Ambulatory Visit: Payer: Self-pay

## 2020-11-26 ENCOUNTER — Ambulatory Visit (INDEPENDENT_AMBULATORY_CARE_PROVIDER_SITE_OTHER): Payer: Federal, State, Local not specified - PPO | Admitting: Family Medicine

## 2020-11-26 ENCOUNTER — Encounter: Payer: Self-pay | Admitting: Family Medicine

## 2020-11-26 VITALS — BP 120/70 | HR 83 | Temp 98.2°F | Resp 12 | Ht 66.5 in | Wt 202.4 lb

## 2020-11-26 DIAGNOSIS — Z23 Encounter for immunization: Secondary | ICD-10-CM | POA: Diagnosis not present

## 2020-11-26 DIAGNOSIS — S51832A Puncture wound without foreign body of left forearm, initial encounter: Secondary | ICD-10-CM

## 2020-11-26 DIAGNOSIS — G8929 Other chronic pain: Secondary | ICD-10-CM

## 2020-11-26 DIAGNOSIS — I10 Essential (primary) hypertension: Secondary | ICD-10-CM | POA: Diagnosis not present

## 2020-11-26 DIAGNOSIS — E785 Hyperlipidemia, unspecified: Secondary | ICD-10-CM | POA: Diagnosis not present

## 2020-11-26 DIAGNOSIS — Z0000 Encounter for general adult medical examination without abnormal findings: Secondary | ICD-10-CM | POA: Diagnosis not present

## 2020-11-26 DIAGNOSIS — R1031 Right lower quadrant pain: Secondary | ICD-10-CM | POA: Diagnosis not present

## 2020-11-26 NOTE — Patient Instructions (Signed)
Preventive Care 75 Years and Older, Female Preventive care refers to lifestyle choices and visits with your health care provider that can promote health and wellness. This includes:  A yearly physical exam. This is also called an annual wellness visit.  Regular dental and eye exams.  Immunizations.  Screening for certain conditions.  Healthy lifestyle choices, such as: ? Eating a healthy diet. ? Getting regular exercise. ? Not using drugs or products that contain nicotine and tobacco. ? Limiting alcohol use. What can I expect for my preventive care visit? Physical exam Your health care provider will check your:  Height and weight. These may be used to calculate your BMI (body mass index). BMI is a measurement that tells if you are at a healthy weight.  Heart rate and blood pressure.  Body temperature.  Skin for abnormal spots. Counseling Your health care provider may ask you questions about your:  Past medical problems.  Family's medical history.  Alcohol, tobacco, and drug use.  Emotional well-being.  Home life and relationship well-being.  Sexual activity.  Diet, exercise, and sleep habits.  History of falls.  Memory and ability to understand (cognition).  Work and work Statistician.  Pregnancy and menstrual history.  Access to firearms. What immunizations do I need? Vaccines are usually given at various ages, according to a schedule. Your health care provider will recommend vaccines for you based on your age, medical history, and lifestyle or other factors, such as travel or where you work.   What tests do I need? Blood tests  Lipid and cholesterol levels. These may be checked every 5 years, or more often depending on your overall health.  Hepatitis C test.  Hepatitis B test. Screening  Lung cancer screening. You may have this screening every year starting at age 75 if you have a 30-pack-year history of smoking and currently smoke or have quit within  the past 15 years.  Colorectal cancer screening. ? All adults should have this screening starting at age 75 and continuing until age 75. ? Your health care provider may recommend screening at age 2 if you are at increased risk. ? You will have tests every 1-10 years, depending on your results and the type of screening test.  Diabetes screening. ? This is done by checking your blood sugar (glucose) after you have not eaten for a while (fasting). ? You may have this done every 1-3 years.  Mammogram. ? This may be done every 1-2 years. ? Talk with your health care provider about how often you should have regular mammograms.  Abdominal aortic aneurysm (AAA) screening. You may need this if you are a current or former smoker.  BRCA-related cancer screening. This may be done if you have a family history of breast, ovarian, tubal, or peritoneal cancers. Other tests  STD (sexually transmitted disease) testing, if you are at risk.  Bone density scan. This is done to screen for osteoporosis. You may have this done starting at age 75. Talk with your health care provider about your test results, treatment options, and if necessary, the need for more tests. Follow these instructions at home: Eating and drinking  Eat a diet that includes fresh fruits and vegetables, whole grains, lean protein, and low-fat dairy products. Limit your intake of foods with high amounts of sugar, saturated fats, and salt.  Take vitamin and mineral supplements as recommended by your health care provider.  Do not drink alcohol if your health care provider tells you not to drink.  If you drink alcohol: ? Limit how much you have to 0-1 drink a day. ? Be aware of how much alcohol is in your drink. In the [place] one drink equals one 12 oz bottle of beer (355 mL), one 5 oz glass of wine (148 mL), or one 1 oz glass of hard liquor (44 mL).   Lifestyle  Take daily care of your teeth and gums. Brush your teeth every morning  and night with fluoride toothpaste. Floss one time each day.  Stay active. Exercise for at least 30 minutes 5 or more days each week.  Do not use any products that contain nicotine or tobacco, such as cigarettes, e-cigarettes, and chewing tobacco. If you need help quitting, ask your health care provider.  Do not use drugs.  If you are sexually active, practice safe sex. Use a condom or other form of protection in order to prevent STIs (sexually transmitted infections).  Talk with your health care provider about taking a low-dose aspirin or statin.  Find healthy ways to cope with stress, such as: ? Meditation, yoga, or listening to music. ? Journaling. ? Talking to a trusted person. ? Spending time with friends and family. Safety  Always wear your seat belt while driving or riding in a vehicle.  Do not drive: ? If you have been drinking alcohol. Do not ride with someone who has been drinking. ? When you are tired or distracted. ? While texting.  Wear a helmet and other protective equipment during sports activities.  If you have firearms in your house, make sure you follow all gun safety procedures. What's next?  Visit your health care provider once a year for an annual wellness visit.  Ask your health care provider how often you should have your eyes and teeth checked.  Stay up to date on all vaccines. This information is not intended to replace advice given to you by your health care provider. Make sure you discuss any questions you have with your health care provider. Document Revised: 10/10/2020 Document Reviewed: 10/14/2018 Elsevier Patient Education  2021 Elsevier Inc.  

## 2020-11-26 NOTE — Progress Notes (Signed)
Subjective:     Robin Wall is a 75 y.o. female and is here for a comprehensive physical exam. The patient reports hx of a puncture wound from old nail a while back --- it has healed but she would like td updated  She is also still c/o RLQ pain -----  It is not better-- no worse either ---still no nVD, no constipation .  Social History   Socioeconomic History  . Marital status: Married    Spouse name: Not on file  . Number of children: 2  . Years of education: Not on file  . Highest education level: Not on file  Occupational History  . 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 Smoking status: Never Smoker  . Smokeless tobacco: Never Used  Substance and Sexual Activity  . Alcohol use: No  . Drug use: No  . Sexual activity: Yes    Partners: Male  Other Topics Concern  . Not on file  Social History Narrative   Exercising---walking      Social Determinants of Health   Financial Resource Strain: Not on file  Food Insecurity: Not on file  Transportation Needs: Not on file  Physical Activity: Not on file  Stress: Not on file  Social Connections: Not on file  Intimate Partner Violence: Not on file   Health Maintenance  Topic Date Due  . INFLUENZA VACCINE  06/03/2020  . MAMMOGRAM  02/28/2021  . TETANUS/TDAP  04/22/2021  . COLONOSCOPY (Pts 45-78yrs Insurance coverage will need to be confirmed)  09/08/2026  . DEXA SCAN  Completed  . COVID-19 Vaccine  Completed  . Hepatitis C Screening  Completed  . PNA vac Low Risk Adult  Completed    The following portions of the patient's history were reviewed and updated as appropriate:  She  has a past medical history of Allergy, Colitis, GERD (gastroesophageal reflux disease), Hyperlipidemia, Hypertension, and TGA (transient global amnesia). She does not have any pertinent problems on file. She  has a past surgical history that includes Cataract extraction (Bilateral) and  Tonsillectomy. Her family history includes Breast cancer (age of onset: 75) in her paternal grandmother; Coronary artery disease in an other family member; Diabetes in her father; Heart disease in her father; Hyperlipidemia in her father and mother; Hypertension in her father and mother. She  reports that she has never smoked. She has never used smokeless tobacco. She reports that she does not drink alcohol and does not use drugs. She has a current medication list which includes the following prescription(s): aspirin, bupropion, fluticasone, ibuprofen, levocetirizine, multivitamin with minerals, omeprazole, simvastatin, and valsartan-hydrochlorothiazide, and the following Facility-Administered Medications: sodium chloride. Current Outpatient Medications on File Prior to Visit  Medication Sig Dispense Refill  . aspirin 81 MG tablet Take 81 mg by mouth daily.    Marland Kitchen buPROPion (WELLBUTRIN XL) 300 MG 24 hr tablet TAKE 1 TABLET BY MOUTH EVERY DAY 90 tablet 1  . fluticasone (FLONASE) 50 MCG/ACT nasal spray PLACE 2 SPRAYS INTO BOTH NOSTRILS AS NEEDED FOR ALLERGIES OR RHINITIS. 48 mL 1  . ibuprofen (ADVIL,MOTRIN) 200 MG tablet Take 400 mg by mouth at bedtime as needed (pain/ sleep).    Marland Kitchen levocetirizine (XYZAL) 5 MG tablet Take 1 tablet (5 mg total) by mouth daily. 90 tablet 3  . Multiple Vitamin (MULTIVITAMIN WITH MINERALS) TABS tablet Take 1 tablet by mouth at bedtime.    Marland Kitchen omeprazole (PRILOSEC)  20 MG capsule TAKE 1 CAPSULE BY MOUTH EVERY DAY 90 capsule 3  . simvastatin (ZOCOR) 40 MG tablet Take 1 tablet (40 mg total) by mouth at bedtime. 90 tablet 1  . valsartan-hydrochlorothiazide (DIOVAN-HCT) 80-12.5 MG tablet TAKE 1 TABLET BY MOUTH EVERY DAY 90 tablet 1   Current Facility-Administered Medications on File Prior to Visit  Medication Dose Route Frequency Provider Last Rate Last Admin  . 0.9 %  sodium chloride infusion  500 mL Intravenous Continuous Armbruster, Carlota Raspberry, MD       She has No Known  Allergies..  Review of Systems Review of Systems  Constitutional: Negative for activity change, appetite change and fatigue.  HENT: Negative for hearing loss, congestion, tinnitus and ear discharge.  dentist q41m Eyes: Negative for visual disturbance (see optho q1y -- vision corrected to 20/20 with glasses).  Respiratory: Negative for cough, chest tightness and shortness of breath.   Cardiovascular: Negative for chest pain, palpitations and leg swelling.  Gastrointestinal: Negative for abdominal pain, diarrhea, constipation and abdominal distention.  Genitourinary: Negative for urgency, frequency, decreased urine volume and difficulty urinating.  Musculoskeletal: Negative for back pain, arthralgias and gait problem.  Skin: Negative for color change, pallor and rash.  Neurological: Negative for dizziness, light-headedness, numbness and headaches.  Hematological: Negative for adenopathy. Does not bruise/bleed easily.  Psychiatric/Behavioral: Negative for suicidal ideas, confusion, sleep disturbance, self-injury, dysphoric mood, decreased concentration and agitation.       Objective:    BP 120/70 (BP Location: Right Arm, Cuff Size: Large)   Pulse 83   Temp 98.2 F (36.8 C) (Oral)   Resp 12   Ht 5' 6.5" (1.689 m)   Wt 202 lb 6.4 oz (91.8 kg)   LMP  (LMP Unknown)   SpO2 97%   BMI 32.18 kg/m  General appearance: alert, cooperative, appears stated age and no distress Head: Normocephalic, without obvious abnormality, atraumatic Eyes: conjunctivae/corneas clear. PERRL, EOM's intact. Fundi benign. Ears: normal TM's and external ear canals both ears Neck: no adenopathy, no carotid bruit, no JVD, supple, symmetrical, trachea midline and thyroid not enlarged, symmetric, no tenderness/mass/nodules Back: symmetric, no curvature. ROM normal. No CVA tenderness. Lungs: clear to auscultation bilaterally Breasts: normal appearance, no masses or tenderness Heart: regular rate and rhythm, S1, S2  normal, no murmur, click, rub or gallop Abdomen: soft, non-tender; bowel sounds normal; no masses,  no organomegaly Pelvic: not indicated; post-menopausal, no abnormal Pap smears in past Extremities: extremities normal, atraumatic, no cyanosis or edema Pulses: 2+ and symmetric Skin: Skin color, texture, turgor normal. No rashes or lesions Lymph nodes: Cervical, supraclavicular, and axillary nodes normal. Neurologic: Alert and oriented X 3, normal strength and tone. Normal symmetric reflexes. Normal coordination and gait    Assessment:    Healthy female exam.      Plan:    ghm utd Check labs  See After Visit Summary for Counseling Recommendations    1. Need for influenza vaccination  - Flu Vaccine QUAD High Dose(Fluad)  2. Preventative health care See above   3. Primary hypertension Well controlled, no changes to meds. Encouraged heart healthy diet such as the DASH diet and exercise as tolerated.   - CBC with Differential/Platelet - Comprehensive metabolic panel  4. Hyperlipidemia, unspecified hyperlipidemia type Encouraged heart healthy diet, increase exercise, avoid trans fats, consider a krill oil cap daily  - Lipid panel - CBC with Differential/Platelet - Comprehensive metabolic panel  5. Chronic RLQ pain Cont RLQ pain ----  [place] abd did not  find anything === check US pelvis  - US Pelvic Complete With Transvaginal; Future  6. Need for tetanus booster   - Td vaccine greater than or equal to 7yo preservative free IM  7. Puncture wound of forearm with complication, left, initial encounter   - Td vaccine greater than or equal to 7yo preservative free IM

## 2020-11-27 LAB — CBC WITH DIFFERENTIAL/PLATELET
Basophils Absolute: 0 10*3/uL (ref 0.0–0.1)
Basophils Relative: 1 % (ref 0.0–3.0)
Eosinophils Absolute: 0.1 10*3/uL (ref 0.0–0.7)
Eosinophils Relative: 2.1 % (ref 0.0–5.0)
HCT: 35.7 % — ABNORMAL LOW (ref 36.0–46.0)
Hemoglobin: 11.9 g/dL — ABNORMAL LOW (ref 12.0–15.0)
Lymphocytes Relative: 32.2 % (ref 12.0–46.0)
Lymphs Abs: 1.6 10*3/uL (ref 0.7–4.0)
MCHC: 33.5 g/dL (ref 30.0–36.0)
MCV: 84.2 fl (ref 78.0–100.0)
Monocytes Absolute: 0.4 10*3/uL (ref 0.1–1.0)
Monocytes Relative: 8.6 % (ref 3.0–12.0)
Neutro Abs: 2.8 10*3/uL (ref 1.4–7.7)
Neutrophils Relative %: 56.1 % (ref 43.0–77.0)
Platelets: 231 10*3/uL (ref 150.0–400.0)
RBC: 4.24 Mil/uL (ref 3.87–5.11)
RDW: 14.1 % (ref 11.5–15.5)
WBC: 4.9 10*3/uL (ref 4.0–10.5)

## 2020-11-27 LAB — COMPREHENSIVE METABOLIC PANEL
ALT: 16 U/L (ref 0–35)
AST: 15 U/L (ref 0–37)
Albumin: 4.5 g/dL (ref 3.5–5.2)
Alkaline Phosphatase: 65 U/L (ref 39–117)
BUN: 18 mg/dL (ref 6–23)
CO2: 31 mEq/L (ref 19–32)
Calcium: 9.4 mg/dL (ref 8.4–10.5)
Chloride: 100 mEq/L (ref 96–112)
Creatinine, Ser: 0.8 mg/dL (ref 0.40–1.20)
GFR: 72.59 mL/min (ref >60.00)
Glucose, Bld: 85 mg/dL (ref 70–99)
Potassium: 3.9 mEq/L (ref 3.5–5.1)
Sodium: 138 mEq/L (ref 135–145)
Total Bilirubin: 0.6 mg/dL (ref 0.2–1.2)
Total Protein: 6.6 g/dL (ref 6.0–8.3)

## 2020-11-27 LAB — LIPID PANEL
Cholesterol: 196 mg/dL (ref 0–200)
HDL: 61.3 mg/dL (ref >39.00)
LDL Cholesterol: 106 mg/dL — ABNORMAL HIGH (ref 0–99)
NonHDL: 135.05
Total CHOL/HDL Ratio: 3
Triglycerides: 146 mg/dL (ref 0.0–149.0)
VLDL: 29.2 mg/dL (ref 0.0–40.0)

## 2020-12-05 ENCOUNTER — Ambulatory Visit (HOSPITAL_BASED_OUTPATIENT_CLINIC_OR_DEPARTMENT_OTHER): Payer: Federal, State, Local not specified - PPO

## 2020-12-12 ENCOUNTER — Other Ambulatory Visit: Payer: Self-pay

## 2020-12-12 ENCOUNTER — Ambulatory Visit (HOSPITAL_BASED_OUTPATIENT_CLINIC_OR_DEPARTMENT_OTHER)
Admission: RE | Admit: 2020-12-12 | Discharge: 2020-12-12 | Disposition: A | Discharge status: Home / Self Care | Payer: Federal, State, Local not specified - PPO | Source: Ambulatory Visit | Attending: Family Medicine | Admitting: Family Medicine

## 2020-12-12 DIAGNOSIS — G8929 Other chronic pain: Secondary | ICD-10-CM | POA: Insufficient documentation

## 2020-12-12 DIAGNOSIS — R1031 Right lower quadrant pain: Secondary | ICD-10-CM | POA: Insufficient documentation

## 2020-12-24 ENCOUNTER — Telehealth: Payer: Self-pay | Admitting: Family Medicine

## 2020-12-24 ENCOUNTER — Other Ambulatory Visit: Payer: Self-pay

## 2020-12-24 DIAGNOSIS — R110 Nausea: Secondary | ICD-10-CM

## 2020-12-24 MED ORDER — ONDANSETRON 4 MG PO TBDP: 4.0000 mg | ORAL_TABLET | Freq: Three times a day (TID) | ORAL | 0 refills | Status: AC | PRN

## 2020-12-24 NOTE — Telephone Encounter (Signed)
Are you willing to send a rx in without an appointment?

## 2020-12-24 NOTE — Telephone Encounter (Signed)
Zofran 4 mg 1 po tid #20 She can use immodium for diarrhea-----  if meds do not work she will need ER for IV  She needs to know we have had several pt with NVD that were+ for covid

## 2020-12-24 NOTE — Telephone Encounter (Signed)
Pt has some at home test and will test herself. Zofran was sent to the pharmacy, pt is aware.

## 2020-12-24 NOTE — Telephone Encounter (Signed)
Patient states she is having chills nausea vomitting and diarrhea. She states she knows its not covid and that's its only a stomach bug Patient  wants to know if Lowne can call her something in the pharm. I offered her an appt. BUT she states she only wanted meds sent in. Please advise... Patient states Etter Sjogren has treated her for this in the past, after a recent cruise. But states she hasn't been on a cruise lately.

## 2021-01-03 ENCOUNTER — Encounter: Payer: Self-pay | Admitting: Family Medicine

## 2021-01-03 ENCOUNTER — Other Ambulatory Visit: Payer: Self-pay

## 2021-01-03 ENCOUNTER — Ambulatory Visit: Payer: Federal, State, Local not specified - PPO | Admitting: Family Medicine

## 2021-01-03 VITALS — BP 124/72 | HR 96 | Temp 98.4°F | Resp 18 | Ht 66.6 in | Wt 197.8 lb

## 2021-01-03 DIAGNOSIS — N390 Urinary tract infection, site not specified: Secondary | ICD-10-CM | POA: Diagnosis not present

## 2021-01-03 LAB — POC URINALSYSI DIPSTICK (AUTOMATED)
Bilirubin, UA: NEGATIVE
Glucose, UA: NEGATIVE
Ketones, UA: NEGATIVE
Nitrite, UA: POSITIVE
Protein, UA: POSITIVE — AB
Spec Grav, UA: 1.025 (ref 1.010–1.025)
Urobilinogen, UA: 0.2 E.U./dL
pH, UA: 5 (ref 5.0–8.0)

## 2021-01-03 MED ORDER — CEPHALEXIN 500 MG PO CAPS: 500.0000 mg | ORAL_CAPSULE | Freq: Two times a day (BID) | ORAL | 0 refills | Status: DC

## 2021-01-03 MED ORDER — PHENAZOPYRIDINE HCL 200 MG PO TABS: 200.0000 mg | ORAL_TABLET | Freq: Three times a day (TID) | ORAL | 0 refills | Status: AC | PRN

## 2021-01-03 NOTE — Patient Instructions (Signed)
Urinary Tract Infection, Adult A urinary tract infection (UTI) is an infection of any part of the urinary tract. The urinary tract includes:  The kidneys.  The ureters.  The bladder.  The urethra. These organs make, store, and get rid of pee (urine) in the body. What are the causes? This infection is caused by germs (bacteria) in your genital area. These germs grow and cause swelling (inflammation) of your urinary tract. What increases the risk? The following factors may make you more likely to develop this condition:  Using a small, thin tube (catheter) to drain pee.  Not being able to control when you pee or poop (incontinence).  Being female. If you are female, these things can increase the risk: ? Using these methods to prevent pregnancy:  A medicine that kills sperm (spermicide).  A device that blocks sperm (diaphragm). ? Having low levels of a female hormone (estrogen). ? Being pregnant. You are more likely to develop this condition if:  You have genes that add to your risk.  You are sexually active.  You take antibiotic medicines.  You have trouble peeing because of: ? A prostate that is bigger than normal, if you are female. ? A blockage in the part of your body that drains pee from the bladder. ? A kidney stone. ? A nerve condition that affects your bladder. ? Not getting enough to drink. ? Not peeing often enough.  You have other conditions, such as: ? Diabetes. ? A weak disease-fighting system (immune system). ? Sickle cell disease. ? Gout. ? Injury of the spine. What are the signs or symptoms? Symptoms of this condition include:  Needing to pee right away.  Peeing small amounts often.  Pain or burning when peeing.  Blood in the pee.  Pee that smells bad or not like normal.  Trouble peeing.  Pee that is cloudy.  Fluid coming from the vagina, if you are female.  Pain in the belly or lower back. Other symptoms include:  Vomiting.  Not  feeling hungry.  Feeling mixed up (confused). This may be the first symptom in older adults.  Being tired and grouchy (irritable).  A fever.  Watery poop (diarrhea). How is this treated?  Taking antibiotic medicine.  Taking other medicines.  Drinking enough water. In some cases, you may need to see a specialist. Follow these instructions at home: Medicines  Take over-the-counter and prescription medicines only as told by your doctor.  If you were prescribed an antibiotic medicine, take it as told by your doctor. Do not stop taking it even if you start to feel better. General instructions  Make sure you: ? Pee until your bladder is empty. ? Do not hold pee for a long time. ? Empty your bladder after sex. ? Wipe from front to back after peeing or pooping if you are a female. Use each tissue one time when you wipe.  Drink enough fluid to keep your pee pale yellow.  Keep all follow-up visits.   Contact a doctor if:  You do not get better after 1-2 days.  Your symptoms go away and then come back. Get help right away if:  You have very bad back pain.  You have very bad pain in your lower belly.  You have a fever.  You have chills.  You feeling like you will vomit or you vomit. Summary  A urinary tract infection (UTI) is an infection of any part of the urinary tract.  This condition is caused by   germs in your genital area.  There are many risk factors for a UTI.  Treatment includes antibiotic medicines.  Drink enough fluid to keep your pee pale yellow. This information is not intended to replace advice given to you by your health care provider. Make sure you discuss any questions you have with your health care provider. Document Revised: 06/01/2020 Document Reviewed: 06/01/2020 Elsevier Patient Education  2021 Elsevier Inc.  

## 2021-01-03 NOTE — Progress Notes (Signed)
Patient ID: Robin Wall, female    DOB: 06-27-1946  Age: 75 y.o. MRN: [id number]    Subjective:  Subjective  HPI Nika Yazzie presents for 2 days hx dysuria, frequency and urgency.  No fevers, some r flank pain   Review of Systems  Constitutional: Negative for appetite change, diaphoresis, fatigue and unexpected weight change.  Eyes: Negative for pain, redness and visual disturbance.  Respiratory: Negative for cough, chest tightness, shortness of breath and wheezing.   Cardiovascular: Negative for chest pain, palpitations and leg swelling.  Endocrine: Negative for cold intolerance, heat intolerance, polydipsia, polyphagia and polyuria.  Genitourinary: Positive for dysuria, flank pain, frequency and urgency. Negative for difficulty urinating.  Neurological: Negative for dizziness, light-headedness, numbness and headaches.    History Past Medical History:  Diagnosis Date  . Allergy   . Colitis    history of  . GERD (gastroesophageal reflux disease)   . Hyperlipidemia   . Hypertension   . TGA (transient global amnesia)    05/2016    She has a past surgical history that includes Cataract extraction (Bilateral) and Tonsillectomy.   Her family history includes Breast cancer (age of onset: 38) in her paternal grandmother; Coronary artery disease in an other family member; Diabetes in her father; Heart disease in her father; Hyperlipidemia in her father and mother; Hypertension in her father and mother.She reports that she has never smoked. She has never used smokeless tobacco. She reports that she does not drink alcohol and does not use drugs.  Current Outpatient Medications on File Prior to Visit  Medication Sig Dispense Refill  . aspirin 81 MG tablet Take 81 mg by mouth daily.    Marland Kitchen buPROPion (WELLBUTRIN XL) 300 MG 24 hr tablet TAKE 1 TABLET BY MOUTH EVERY DAY 90 tablet 1  . fluticasone (FLONASE) 50 MCG/ACT nasal spray PLACE 2 SPRAYS INTO BOTH NOSTRILS AS NEEDED FOR  ALLERGIES OR RHINITIS. 48 mL 1  . ibuprofen (ADVIL,MOTRIN) 200 MG tablet Take 400 mg by mouth at bedtime as needed (pain/ sleep).    Marland Kitchen levocetirizine (XYZAL) 5 MG tablet Take 1 tablet (5 mg total) by mouth daily. 90 tablet 3  . Multiple Vitamin (MULTIVITAMIN WITH MINERALS) TABS tablet Take 1 tablet by mouth at bedtime.    Marland Kitchen omeprazole (PRILOSEC) 20 MG capsule TAKE 1 CAPSULE BY MOUTH EVERY DAY 90 capsule 3  . ondansetron (ZOFRAN ODT) 4 MG disintegrating tablet Take 1 tablet (4 mg total) by mouth every 8 (eight) hours as needed for nausea or vomiting. 20 tablet 0  . simvastatin (ZOCOR) 40 MG tablet Take 1 tablet (40 mg total) by mouth at bedtime. 90 tablet 1   Current Facility-Administered Medications on File Prior to Visit  Medication Dose Route Frequency Provider Last Rate Last Admin  . 0.9 %  sodium chloride infusion  500 mL Intravenous Continuous Armbruster, Carlota Raspberry, MD         Objective:  Objective  Physical Exam Vitals and nursing note reviewed.  Constitutional:      Appearance: She is well-developed and well-nourished.  HENT:     Head: Normocephalic and atraumatic.  Eyes:     Extraocular Movements: EOM normal.     Conjunctiva/sclera: Conjunctivae normal.  Neck:     Thyroid: No thyromegaly.     Vascular: No carotid bruit or JVD.  Cardiovascular:     Rate and Rhythm: Normal rate and regular rhythm.     Heart sounds: Normal heart sounds. No murmur heard.  Pulmonary:     Effort: Pulmonary effort is normal. No respiratory distress.     Breath sounds: Normal breath sounds. No wheezing or rales.  Chest:     Chest wall: No tenderness.  Abdominal:     General: There is no distension.     Tenderness: There is no abdominal tenderness. There is no right CVA tenderness, left CVA tenderness, guarding or rebound.  Musculoskeletal:        General: No edema.     Cervical back: Normal range of motion and neck supple.  Neurological:     Mental Status: She is alert and oriented to  person, place, and time.  Psychiatric:        Mood and Affect: Mood and affect normal.    BP 124/72 (BP Location: Right Arm, Patient Position: Sitting, Cuff Size: Normal)   Pulse 96   Temp 98.4 F (36.9 C) (Oral)   Resp 18   Ht 5' 6.6" (1.692 m)   Wt 197 lb 12.8 oz (89.7 kg)   LMP  (LMP Unknown)   SpO2 96%   BMI 31.35 kg/m  Wt Readings from Last 3 Encounters:  01/03/21 197 lb 12.8 oz (89.7 kg)  11/26/20 202 lb 6.4 oz (91.8 kg)  05/17/20 207 lb 6.4 oz (94.1 kg)     Lab Results  Component Value Date   WBC 4.9 11/26/2020   HGB 11.9 (L) 11/26/2020   HCT 35.7 (L) 11/26/2020   PLT 231.0 11/26/2020   GLUCOSE 85 11/26/2020   CHOL 196 11/26/2020   TRIG 146.0 11/26/2020   HDL 61.30 11/26/2020   LDLDIRECT 96.0 04/22/2018   LDLCALC 106 (H) 11/26/2020   ALT 16 11/26/2020   AST 15 11/26/2020   NA 138 11/26/2020   K 3.9 11/26/2020   CL 100 11/26/2020   CREATININE 0.80 11/26/2020   BUN 18 11/26/2020   CO2 31 11/26/2020   TSH 1.76 11/17/2019   TSH 1.81 11/17/2019   INR 0.89 06/02/2016   HGBA1C 5.7 02/21/2020    US Pelvic Complete With Transvaginal  Result Date: 12/12/2020 CLINICAL DATA:  Initial evaluation for right lower quadrant pain. EXAM: TRANSABDOMINAL AND TRANSVAGINAL ULTRASOUND OF PELVIS TECHNIQUE: Both transabdominal and transvaginal ultrasound examinations of the pelvis were performed. Transabdominal technique was performed for global imaging of the pelvis including uterus, ovaries, adnexal regions, and pelvic cul-de-sac. It was necessary to proceed with endovaginal exam following the transabdominal exam to visualize the uterus, endometrium, and ovaries. COMPARISON:  None FINDINGS: Uterus Measurements: 5.8 x 3.3 x 4.6 cm = volume: 46.8 mL. Uterus is retroverted. 1.5 x 1.4 x 2.0 cm intramural fibroid present at the anterior fundus. Endometrium Thickness: 3.4 mm.  No focal abnormality visualized. Right ovary Measurements: 2.3 x 1.0 x 0.8 cm = volume: 1.0 mL. Normal  appearance/no adnexal mass. Incidental note made of a few small microcalcifications, of doubtful significance. Left ovary Measurements: 2.0 x 0.8 x 0.9 cm = volume: 0.8 mL. Normal appearance/no adnexal mass. Other findings No abnormal free fluid. IMPRESSION: 1. 2 cm intramural fibroid at the uterine fundus. 2. Otherwise unremarkable and normal pelvic ultrasound for age. No other adnexal mass or free fluid. No other findings to explain patient's symptoms identified. Electronically Signed   By: Jeannine Boga M.D.   On: 12/12/2020 21:04     Assessment & Plan:  Plan  I am having Hassan Rowan C. Broughton start on cephALEXin and phenazopyridine. I am also having her maintain her multivitamin with minerals, ibuprofen, aspirin, fluticasone, levocetirizine, buPROPion,  omeprazole, simvastatin, and ondansetron. We will continue to administer sodium chloride.  Meds ordered this encounter  Medications  . cephALEXin (KEFLEX) 500 MG capsule    Sig: Take 1 capsule (500 mg total) by mouth 2 (two) times daily.    Dispense:  14 capsule    Refill:  0  . phenazopyridine (PYRIDIUM) 200 MG tablet    Sig: Take 1 tablet (200 mg total) by mouth 3 (three) times daily as needed for pain.    Dispense:  6 tablet    Refill:  0    Problem List Items Addressed This Visit   None   Visit Diagnoses    Urinary tract infection without hematuria, site unspecified    -  Primary   Relevant Medications   cephALEXin (KEFLEX) 500 MG capsule   phenazopyridine (PYRIDIUM) 200 MG tablet   Other Relevant Orders   POCT Urinalysis Dipstick (Automated) (Completed)   Urine Culture (Completed)      Follow-up: Return if symptoms worsen or fail to improve.  Ann Held, DO

## 2021-01-04 ENCOUNTER — Other Ambulatory Visit: Payer: Self-pay | Admitting: Family Medicine

## 2021-01-04 DIAGNOSIS — I10 Essential (primary) hypertension: Secondary | ICD-10-CM

## 2021-01-05 LAB — URINE CULTURE
MICRO NUMBER:: 11603515
SPECIMEN QUALITY:: ADEQUATE

## 2021-02-14 ENCOUNTER — Other Ambulatory Visit: Payer: Self-pay | Admitting: Family Medicine

## 2021-02-14 ENCOUNTER — Telehealth: Payer: Self-pay | Admitting: Family Medicine

## 2021-02-14 DIAGNOSIS — Z1231 Encounter for screening mammogram for malignant neoplasm of breast: Secondary | ICD-10-CM

## 2021-02-14 NOTE — Telephone Encounter (Signed)
Patient states she was told by the Radiology office she wants to schedule her mammogram with needs an order for her Bone Density test before they will make her an appt for the test.

## 2021-02-18 ENCOUNTER — Other Ambulatory Visit: Payer: Self-pay

## 2021-02-18 DIAGNOSIS — Z13820 Encounter for screening for osteoporosis: Secondary | ICD-10-CM

## 2021-02-18 DIAGNOSIS — Z780 Asymptomatic menopausal state: Secondary | ICD-10-CM

## 2021-02-18 NOTE — Telephone Encounter (Signed)
Order placed

## 2021-03-12 ENCOUNTER — Other Ambulatory Visit: Payer: Self-pay

## 2021-03-12 ENCOUNTER — Ambulatory Visit
Admission: RE | Admit: 2021-03-12 | Discharge: 2021-03-12 | Disposition: A | Discharge status: Home / Self Care | Payer: Federal, State, Local not specified - PPO | Source: Ambulatory Visit | Attending: Family Medicine | Admitting: Family Medicine

## 2021-03-12 DIAGNOSIS — Z13820 Encounter for screening for osteoporosis: Secondary | ICD-10-CM

## 2021-03-12 DIAGNOSIS — Z780 Asymptomatic menopausal state: Secondary | ICD-10-CM

## 2021-03-30 ENCOUNTER — Other Ambulatory Visit: Payer: Self-pay | Admitting: Family Medicine

## 2021-04-05 ENCOUNTER — Ambulatory Visit: Payer: Federal, State, Local not specified - PPO

## 2021-04-09 ENCOUNTER — Other Ambulatory Visit: Payer: Self-pay

## 2021-04-09 ENCOUNTER — Ambulatory Visit
Admission: RE | Admit: 2021-04-09 | Discharge: 2021-04-09 | Disposition: A | Discharge status: Home / Self Care | Payer: Federal, State, Local not specified - PPO | Source: Ambulatory Visit | Attending: Family Medicine | Admitting: Family Medicine

## 2021-04-09 DIAGNOSIS — Z1231 Encounter for screening mammogram for malignant neoplasm of breast: Secondary | ICD-10-CM

## 2021-05-13 ENCOUNTER — Other Ambulatory Visit: Payer: Self-pay | Admitting: Family Medicine

## 2021-05-13 DIAGNOSIS — E785 Hyperlipidemia, unspecified: Secondary | ICD-10-CM

## 2021-05-13 DIAGNOSIS — I10 Essential (primary) hypertension: Secondary | ICD-10-CM

## 2021-05-27 ENCOUNTER — Other Ambulatory Visit: Payer: Self-pay

## 2021-05-27 ENCOUNTER — Encounter: Payer: Self-pay | Admitting: Family Medicine

## 2021-05-27 ENCOUNTER — Ambulatory Visit: Payer: Federal, State, Local not specified - PPO | Admitting: Family Medicine

## 2021-05-27 VITALS — BP 140/80 | HR 84 | Temp 98.5°F | Resp 18 | Ht 66.6 in | Wt 196.0 lb

## 2021-05-27 DIAGNOSIS — I10 Essential (primary) hypertension: Secondary | ICD-10-CM | POA: Diagnosis not present

## 2021-05-27 DIAGNOSIS — F321 Major depressive disorder, single episode, moderate: Secondary | ICD-10-CM

## 2021-05-27 DIAGNOSIS — E785 Hyperlipidemia, unspecified: Secondary | ICD-10-CM

## 2021-05-27 MED ORDER — ESCITALOPRAM OXALATE 10 MG PO TABS: 10.0000 mg | ORAL_TABLET | Freq: Every day | ORAL | 1 refills | Status: DC

## 2021-05-27 NOTE — Assessment & Plan Note (Signed)
Well controlled, no changes to meds. Encouraged heart healthy diet such as the DASH diet and exercise as tolerated.  °

## 2021-05-27 NOTE — Assessment & Plan Note (Signed)
con't lexapro  F/u 6 months or sooner prn

## 2021-05-27 NOTE — Patient Instructions (Signed)
Major Depressive Disorder, Adult Major depressive disorder (MDD) is a mental health condition. It may also be called clinical depression or unipolar depression. MDD causes symptoms of sadness, hopelessness, and loss of interest in things. These symptoms last most of the day, almost every day, for 2 weeks. MDD can also cause physical symptoms. It can interfere with relationships and with everyday activities,such as work, school, and activities that are usually pleasant. MDD may be mild, moderate, or severe. It may be single-episode MDD, whichhappens once, or recurrent MDD, which may occur multiple times. What are the causes? The exact cause of this condition is not known. MDD is most likely caused by a combination of things, which may include: Your personality traits. Learned or conditioned behaviors or thoughts or feelings that reinforce negativity. Any alcohol or substance misuse. Long-term (chronic) physical or mental health illness. Going through a traumatic experience or major life changes. What increases the risk? The following factors may make someone more likely to develop MDD: A family history of depression. Being a woman. Troubled family relationships. Abnormally low levels of certain brain chemicals. Traumatic or painful events in childhood, especially abuse or loss of a parent. A lot of stress from life experiences, such as poor living conditions or discrimination. Chronic physical illness or other mental health disorders. What are the signs or symptoms? The main symptoms of MDD usually include: Constant depressed or irritable mood. A loss of interest in things and activities. Other symptoms include: Sleeping or eating too much or too little. Unexplained weight gain or weight loss. Tiredness or low energy. Being agitated, restless, or weak. Feeling hopeless, worthless, or guilty. Trouble thinking clearly or making decisions. Thoughts of suicide or thoughts of harming  others. Isolating oneself or avoiding other people or activities. Trouble completing tasks, work, or any normal obligations. Severe symptoms of this condition may include: Psychotic depression.This may include false beliefs, or delusions. It may also include seeing, hearing, tasting, smelling, or feeling things that are not real (hallucinations). Chronic depression or persistent depressive disorder. This is low-level depression that lasts for at least 2 years. Melancholic depression, or feeling extremely sad and hopeless. Catatonic depression, which includes trouble speaking and trouble moving. How is this diagnosed? This condition may be diagnosed based on: Your symptoms. Your medical and mental health history. You may be asked questions about your lifestyle, including any drug and alcohol use. A physical exam. Blood tests to rule out other conditions. MDD is confirmed if you have the following symptoms most of the day, nearly every day, in a 2-week period: Either a depressed mood or loss of interest. At least four other MDD symptoms. How is this treated? This condition is usually treated by mental health professionals, such as psychologists, psychiatrists, and clinical social workers. You may need more than one type of treatment. Treatment may include: Psychotherapy, also called talk therapy or counseling. Types of psychotherapy include: Cognitive behavioral therapy (CBT). This teaches you to recognize unhealthy feelings, thoughts, and behaviors, and replace them with positive thoughts and actions. Interpersonal therapy (IPT). This helps you to improve the way you communicate with others or relate to them. Family therapy. This treatment includes members of your family. Medicines to treat anxiety and depression. These medicines help to balance the brain chemicals that affect your emotions. Lifestyle changes. You may be asked to: Limit alcohol use and avoid drug use. Get regular  exercise. Get plenty of sleep. Make healthy eating choices. Spend more time outdoors. Brain stimulation. This may be done   if symptoms are very severe and other treatments have not worked. Examples of this treatment are electroconvulsive therapy and transcranial magnetic stimulation. Follow these instructions at home: Activity Exercise regularly and spend time outdoors. Find activities that you enjoy doing, and make time to do them. Find healthy ways to manage stress, such as: Meditation or deep breathing. Spending time in nature. Journaling. Return to your normal activities as told by your health care provider. Ask your health care provider what activities are safe for you. Alcohol and drug use If you drink alcohol: Limit how much you use to: 0-1 drink a day for women who are not pregnant. 0-2 drinks a day for men. Be aware of how much alcohol is in your drink. In the [place] one drink equals one 12 oz bottle of beer (355 mL), one 5 oz glass of wine (148 mL), or one 1 oz glass of hard liquor (44 mL). Discuss your alcohol use with your health care provider. Alcohol can affect any antidepressant medicines you are taking. Discuss any drug use with your health care provider. General instructions  Take over-the-counter and prescription medicines only as told by your health care provider. Eat a healthy diet and get plenty of sleep. Consider joining a support group. Your health care provider may be able to recommend one. Keep all follow-up visits as told by your health care provider. This is important.  Where to find more information National Alliance on Mental Illness: www.nami.org [place] National Institute of Mental Health: www.nimh.nih.gov Contact a health care provider if: Your symptoms get worse. You develop new symptoms. Get help right away if: You self-harm. You have serious thoughts about hurting yourself or others. You hallucinate. If you ever feel like you may hurt yourself or  others, or have thoughts about taking your own life, get help right away. Go to your nearest emergency department or: Call your local emergency services (911 in the [place] Call a suicide crisis helpline, such as the National Suicide Prevention Lifeline at 1-800-273-8255. This is open 24 hours a day in the [place] Text the Crisis Text Line at 741741 (in the [place] Summary Major depressive disorder (MDD) is a mental health condition. MDD causes symptoms of sadness, hopelessness, and loss of interest in things. These symptoms last most of the day, almost every day, for 2 weeks. The symptoms of MDD can interfere with relationships and with everyday activities. Treatments and support are available for people who develop MDD. You may need more than one type of treatment. Get help right away if you have serious thoughts about hurting yourself or others. This information is not intended to replace advice given to you by your health care provider. Make sure you discuss any questions you have with your healthcare provider. Document Revised: 10/01/2019 Document Reviewed: 10/01/2019 [employer] Patient Education  2022 [employer]  

## 2021-05-27 NOTE — Progress Notes (Signed)
Patient ID: Robin Wall, female    DOB: [birth year]  Age: 75 y.o. MRN: [id number]    Subjective:  Subjective  HPI Robin Wall presents for an office visit and f/u on HTN and HLD today. She complains of depression and dysphoric mood.  She notes that her husband is currently at a facility and is showing somewhat of an improvement.   She states that her depression is somewhat control with 300 mg buPROPion PO QD. She notes that she didn't tolerate lower dose of 50 mg Zoloft PO QD well.   She endorses taking 40 mg simvastatin PO QHS for her dx of HLD.  Lab Results  Component Value Date   CHOL 196 11/26/2020   CHOL 169 05/17/2020   CHOL 190 02/21/2020   Lab Results  Component Value Date   HDL 61.30 11/26/2020   HDL 61.10 05/17/2020   HDL 65.30 02/21/2020   Lab Results  Component Value Date   LDLCALC 106 (H) 11/26/2020   LDLCALC 80 05/17/2020   LDLCALC 92 02/21/2020   Lab Results  Component Value Date   TRIG 146.0 11/26/2020   TRIG 137.0 05/17/2020   TRIG 163.0 (H) 02/21/2020   Lab Results  Component Value Date   CHOLHDL 3 11/26/2020   CHOLHDL 3 05/17/2020   CHOLHDL 3 02/21/2020   Lab Results  Component Value Date   LDLDIRECT 96.0 04/22/2018   LDLDIRECT 77.0 11/08/2015   LDLDIRECT 134.6 05/31/2007  She notes taking 80-12.5 mg valsartan-hydrochlorothiazide PO QD for her dx of HTN.  BP Readings from Last 3 Encounters:  05/27/21 140/80  01/03/21 124/72  11/26/20 120/70  She denies any chest pain, SOB, fever, abdominal pain, cough, chills, sore throat, dysuria, urinary incontinence, back pain, HA, or N/V/D at this time.   Review of Systems  Constitutional:  Negative for chills, fatigue and fever.  HENT:  Negative for ear pain, rhinorrhea, sinus pressure, sinus pain, sore throat and tinnitus.   Eyes:  Negative for pain.  Respiratory:  Negative for cough, shortness of breath and wheezing.   Cardiovascular:  Negative for chest pain.  Gastrointestinal:   Negative for abdominal pain, anal bleeding, constipation, diarrhea, nausea and vomiting.  Genitourinary:  Negative for flank pain.  Musculoskeletal:  Negative for back pain and neck pain.  Skin:  Negative for rash.  Neurological:  Negative for seizures, weakness, light-headedness, numbness and headaches.  Psychiatric/Behavioral:  Positive for dysphoric mood.        (+) depression     History Past Medical History:  Diagnosis Date   Allergy    Colitis    history of   GERD (gastroesophageal reflux disease)    Hyperlipidemia    Hypertension    TGA (transient global amnesia)    05/2016    She has a past surgical history that includes Cataract extraction (Bilateral) and Tonsillectomy.   Her family history includes Breast cancer (age of onset: 45) in her paternal grandmother; Coronary artery disease in an other family member; Diabetes in her father; Heart disease in her father; Hyperlipidemia in her father and mother; Hypertension in her father and mother.She reports that she has never smoked. She has never used smokeless tobacco. She reports that she does not drink alcohol and does not use drugs.  Current Outpatient Medications on File Prior to Visit  Medication Sig Dispense Refill   aspirin 81 MG tablet Take 81 mg by mouth daily.     buPROPion (WELLBUTRIN XL) 300 MG 24 hr tablet TAKE 1  TABLET BY MOUTH EVERY DAY 90 tablet 1   fluticasone (FLONASE) 50 MCG/ACT nasal spray PLACE 2 SPRAYS INTO BOTH NOSTRILS AS NEEDED FOR ALLERGIES OR RHINITIS. 48 mL 1   ibuprofen (ADVIL,MOTRIN) 200 MG tablet Take 400 mg by mouth at bedtime as needed (pain/ sleep).     levocetirizine (XYZAL) 5 MG tablet Take 1 tablet (5 mg total) by mouth daily. 90 tablet 3   Multiple Vitamin (MULTIVITAMIN WITH MINERALS) TABS tablet Take 1 tablet by mouth at bedtime.     omeprazole (PRILOSEC) 20 MG capsule TAKE 1 CAPSULE BY MOUTH EVERY DAY 90 capsule 3   ondansetron (ZOFRAN ODT) 4 MG disintegrating tablet Take 1 tablet (4 mg  total) by mouth every 8 (eight) hours as needed for nausea or vomiting. 20 tablet 0   phenazopyridine (PYRIDIUM) 200 MG tablet Take 1 tablet (200 mg total) by mouth 3 (three) times daily as needed for pain. 6 tablet 0   simvastatin (ZOCOR) 40 MG tablet TAKE 1 TABLET BY MOUTH EVERYDAY AT BEDTIME 90 tablet 1   valsartan-hydrochlorothiazide (DIOVAN-HCT) 80-12.5 MG tablet TAKE 1 TABLET BY MOUTH EVERY DAY 90 tablet 1   Current Facility-Administered Medications on File Prior to Visit  Medication Dose Route Frequency Provider Last Rate Last Admin   0.9 %  sodium chloride infusion  500 mL Intravenous Continuous Armbruster, Carlota Raspberry, MD         Objective:  Objective  Physical Exam Vitals and nursing note reviewed.  Constitutional:      General: She is not in acute distress.    Appearance: Normal appearance. She is well-developed. She is not ill-appearing.  HENT:     Head: Normocephalic and atraumatic.     Right Ear: External ear normal.     Left Ear: External ear normal.     Nose: Nose normal.  Eyes:     General:        Right eye: No discharge.        Left eye: No discharge.     Extraocular Movements: Extraocular movements intact.     Pupils: Pupils are equal, round, and reactive to light.  Cardiovascular:     Rate and Rhythm: Normal rate and regular rhythm.     Pulses: Normal pulses.     Heart sounds: Normal heart sounds. No murmur heard.   No friction rub. No gallop.  Pulmonary:     Effort: Pulmonary effort is normal. No respiratory distress.     Breath sounds: Normal breath sounds. No stridor. No wheezing, rhonchi or rales.  Chest:     Chest wall: No tenderness.  Abdominal:     General: Bowel sounds are normal. There is no distension.     Palpations: Abdomen is soft. There is no mass.     Tenderness: There is no abdominal tenderness. There is no guarding or rebound.     Hernia: No hernia is present.  Musculoskeletal:        General: Normal range of motion.     Cervical back:  Normal range of motion and neck supple.     Right lower leg: No edema.     Left lower leg: No edema.  Skin:    General: Skin is warm and dry.  Neurological:     Mental Status: She is alert and oriented to person, place, and time.  Psychiatric:        Mood and Affect: Mood is depressed.        Behavior: Behavior normal.  Thought Content: Thought content normal.   BP 140/80 (BP Location: Right Arm, Patient Position: Sitting, Cuff Size: Normal)   Pulse 84   Temp 98.5 F (36.9 C) (Oral)   Resp 18   Ht 5' 6.6" (1.692 m)   Wt 196 lb (88.9 kg)   LMP  (LMP Unknown)   SpO2 97%   BMI 31.07 kg/m  Wt Readings from Last 3 Encounters:  05/27/21 196 lb (88.9 kg)  01/03/21 197 lb 12.8 oz (89.7 kg)  11/26/20 202 lb 6.4 oz (91.8 kg)     Lab Results  Component Value Date   WBC 4.9 11/26/2020   HGB 11.9 (L) 11/26/2020   HCT 35.7 (L) 11/26/2020   PLT 231.0 11/26/2020   GLUCOSE 85 11/26/2020   CHOL 196 11/26/2020   TRIG 146.0 11/26/2020   HDL 61.30 11/26/2020   LDLDIRECT 96.0 04/22/2018   LDLCALC 106 (H) 11/26/2020   ALT 16 11/26/2020   AST 15 11/26/2020   NA 138 11/26/2020   K 3.9 11/26/2020   CL 100 11/26/2020   CREATININE 0.80 11/26/2020   BUN 18 11/26/2020   CO2 31 11/26/2020   TSH 1.76 11/17/2019   TSH 1.81 11/17/2019   INR 0.89 06/02/2016   HGBA1C 5.7 02/21/2020    MM 3D SCREEN BREAST BILATERAL  Result Date: 04/11/2021 CLINICAL DATA:  Screening. EXAM: DIGITAL SCREENING BILATERAL MAMMOGRAM WITH TOMOSYNTHESIS AND CAD TECHNIQUE: Bilateral screening digital craniocaudal and mediolateral oblique mammograms were obtained. Bilateral screening digital breast tomosynthesis was performed. The images were evaluated with computer-aided detection. COMPARISON:  Previous exam(s). ACR Breast Density Category c: The breast tissue is heterogeneously dense, which may obscure small masses. FINDINGS: There are no findings suspicious for malignancy. The images were evaluated with  computer-aided detection. IMPRESSION: No mammographic evidence of malignancy. A result letter of this screening mammogram will be mailed directly to the patient. RECOMMENDATION: Screening mammogram in one year. (Code:SM-B-01Y) BI-RADS CATEGORY  1: Negative. Electronically Signed   By: Ammie Ferrier M.D.   On: 04/11/2021 14:23     Assessment & Plan:  Plan   Meds ordered this encounter  Medications   escitalopram (LEXAPRO) 10 MG tablet    Sig: Take 1 tablet (10 mg total) by mouth daily.    Dispense:  30 tablet    Refill:  1    Problem List Items Addressed This Visit       Unprioritized   Depression, major, single episode, moderate ([place])    con't lexapro  F/u 6 months or sooner prn        Relevant Medications   escitalopram (LEXAPRO) 10 MG tablet   Essential hypertension    Well controlled, no changes to meds. Encouraged heart healthy diet such as the DASH diet and exercise as tolerated.        Hyperlipidemia    Encourage heart healthy diet such as MIND or DASH diet, increase exercise, avoid trans fats, simple carbohydrates and processed foods, consider a krill or fish or flaxseed oil cap daily.        Relevant Orders   Lipid panel   CBC with Differential/Platelet   Comprehensive metabolic panel   Other Visit Diagnoses     Primary hypertension    -  Primary   Relevant Orders   Lipid panel   CBC with Differential/Platelet   Comprehensive metabolic panel       Follow-up: Return in about 6 months (around 11/27/2021), or if symptoms worsen or fail to improve, for annual exam,  fasting.   I,Gordon Zheng,acting as a Education administrator for [employer], DO.,have documented all relevant documentation on the behalf of Ann Held, DO,as directed by  Ann Held, DO while in the presence of [place], DO, have reviewed all documentation for this visit. The documentation on 05/27/21 for the exam, diagnosis,  procedures, and orders are all accurate and complete.

## 2021-05-27 NOTE — Assessment & Plan Note (Signed)
Encourage heart healthy diet such as MIND or DASH diet, increase exercise, avoid trans fats, simple carbohydrates and processed foods, consider a krill or fish or flaxseed oil cap daily.  °

## 2021-05-28 LAB — CBC WITH DIFFERENTIAL/PLATELET
Basophils Absolute: 0 10*3/uL (ref 0.0–0.1)
Basophils Relative: 0.7 % (ref 0.0–3.0)
Eosinophils Absolute: 0.1 10*3/uL (ref 0.0–0.7)
Eosinophils Relative: 1.3 % (ref 0.0–5.0)
HCT: 36.2 % (ref 36.0–46.0)
Hemoglobin: 12 g/dL (ref 12.0–15.0)
Lymphocytes Relative: 29.1 % (ref 12.0–46.0)
Lymphs Abs: 1.7 10*3/uL (ref 0.7–4.0)
MCHC: 33 g/dL (ref 30.0–36.0)
MCV: 84.5 fl (ref 78.0–100.0)
Monocytes Absolute: 0.5 10*3/uL (ref 0.1–1.0)
Monocytes Relative: 9.3 % (ref 3.0–12.0)
Neutro Abs: 3.4 10*3/uL (ref 1.4–7.7)
Neutrophils Relative %: 59.6 % (ref 43.0–77.0)
Platelets: 241 10*3/uL (ref 150.0–400.0)
RBC: 4.29 Mil/uL (ref 3.87–5.11)
RDW: 14.6 % (ref 11.5–15.5)
WBC: 5.7 10*3/uL (ref 4.0–10.5)

## 2021-05-28 LAB — COMPREHENSIVE METABOLIC PANEL
ALT: 19 U/L (ref 0–35)
AST: 18 U/L (ref 0–37)
Albumin: 4.6 g/dL (ref 3.5–5.2)
Alkaline Phosphatase: 64 U/L (ref 39–117)
BUN: 19 mg/dL (ref 6–23)
CO2: 30 mEq/L (ref 19–32)
Calcium: 9.6 mg/dL (ref 8.4–10.5)
Chloride: 102 mEq/L (ref 96–112)
Creatinine, Ser: 0.92 mg/dL (ref 0.40–1.20)
GFR: 61.17 mL/min (ref >60.00)
Glucose, Bld: 80 mg/dL (ref 70–99)
Potassium: 4 mEq/L (ref 3.5–5.1)
Sodium: 142 mEq/L (ref 135–145)
Total Bilirubin: 0.7 mg/dL (ref 0.2–1.2)
Total Protein: 6.8 g/dL (ref 6.0–8.3)

## 2021-05-28 LAB — LIPID PANEL
Cholesterol: 180 mg/dL (ref 0–200)
HDL: 64.8 mg/dL (ref >39.00)
LDL Cholesterol: 87 mg/dL (ref 0–99)
NonHDL: 114.78
Total CHOL/HDL Ratio: 3
Triglycerides: 141 mg/dL (ref 0.0–149.0)
VLDL: 28.2 mg/dL (ref 0.0–40.0)

## 2021-06-20 ENCOUNTER — Other Ambulatory Visit: Payer: Self-pay | Admitting: Family Medicine

## 2021-06-20 DIAGNOSIS — F321 Major depressive disorder, single episode, moderate: Secondary | ICD-10-CM

## 2021-06-29 ENCOUNTER — Other Ambulatory Visit: Payer: Self-pay | Admitting: Family Medicine

## 2021-10-08 ENCOUNTER — Other Ambulatory Visit: Payer: Self-pay | Admitting: Family Medicine

## 2021-11-23 ENCOUNTER — Other Ambulatory Visit: Payer: Self-pay | Admitting: Family Medicine

## 2021-11-23 DIAGNOSIS — E785 Hyperlipidemia, unspecified: Secondary | ICD-10-CM

## 2021-11-28 ENCOUNTER — Other Ambulatory Visit: Payer: Self-pay | Admitting: Family Medicine

## 2021-11-28 DIAGNOSIS — I10 Essential (primary) hypertension: Secondary | ICD-10-CM

## 2021-11-28 DIAGNOSIS — F321 Major depressive disorder, single episode, moderate: Secondary | ICD-10-CM

## 2021-11-29 ENCOUNTER — Encounter: Payer: Self-pay | Admitting: Family Medicine

## 2021-11-29 ENCOUNTER — Ambulatory Visit (INDEPENDENT_AMBULATORY_CARE_PROVIDER_SITE_OTHER): Payer: Federal, State, Local not specified - PPO | Admitting: Family Medicine

## 2021-11-29 VITALS — BP 160/90 | HR 92 | Temp 98.7°F | Resp 18 | Ht 65.6 in | Wt 222.6 lb

## 2021-11-29 DIAGNOSIS — E785 Hyperlipidemia, unspecified: Secondary | ICD-10-CM | POA: Diagnosis not present

## 2021-11-29 DIAGNOSIS — Z23 Encounter for immunization: Secondary | ICD-10-CM | POA: Diagnosis not present

## 2021-11-29 DIAGNOSIS — I10 Essential (primary) hypertension: Secondary | ICD-10-CM | POA: Diagnosis not present

## 2021-11-29 DIAGNOSIS — Z0000 Encounter for general adult medical examination without abnormal findings: Secondary | ICD-10-CM | POA: Diagnosis not present

## 2021-11-29 NOTE — Progress Notes (Addendum)
Subjective:   By signing my name below, I, Zite Okoli, attest that this documentation has been prepared under the direction and in the presence of Ann Held, DO. 11/29/2021    Patient ID: Robin Wall, female    DOB: [birth year], 76 y.o.   MRN: [id number]  Chief Complaint  Patient presents with   Annual Exam    Pt states fasting     HPI Patient is in today for a comprehensive physical exam. She reports she is doing well.  She denies fever, hearing loss, ear pain,congestion, sinus pain, sore throat, eye pain, chest pain, palpitations, cough, shortness of breath, wheezing, nausea. vomiting, diarrhea, constipation, blood in stool, dysuria,frequency, hematuria and headaches.  No recent changes to family medical history. No recent surgeries.  She will receive the flu vaccine today. She has 3 Covid-19 vaccines at this time.    Past Medical History:  Diagnosis Date   Allergy    Colitis    history of   GERD (gastroesophageal reflux disease)    Hyperlipidemia    Hypertension    TGA (transient global amnesia)    05/2016    Past Surgical History:  Procedure Laterality Date   CATARACT EXTRACTION Bilateral    TONSILLECTOMY      Family History  Problem Relation Age of Onset   Hypertension Mother    Hyperlipidemia Mother    Diabetes Father    Heart disease Father        chf   Hypertension Father    Hyperlipidemia Father    Coronary artery disease Other    Breast cancer Paternal Grandmother 68   Colon cancer Neg Hx     Social History   Socioeconomic History   Marital status: Married    Spouse name: Not on file   Number of children: 2   Years of education: Not on file   Highest education level: Not on file  Occupational History   Occupation: retired    Fish farm manager: [employer]    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  Tobacco Use   Smoking status: Never   Smokeless tobacco: Never  Substance and Sexual Activity   Alcohol use: No   Drug use: No   Sexual  activity: Yes    Partners: Male  Other Topics Concern   Not on file  Social History Narrative   Exercising---walking      Social Determinants of Health   Financial Resource Strain: Not on file  Food Insecurity: Not on file  Transportation Needs: Not on file  Physical Activity: Not on file  Stress: Not on file  Social Connections: Not on file  Intimate Partner Violence: Not on file    Outpatient Medications Prior to Visit  Medication Sig Dispense Refill   aspirin 81 MG tablet Take 81 mg by mouth daily.     buPROPion (WELLBUTRIN XL) 300 MG 24 hr tablet TAKE 1 TABLET BY MOUTH EVERY DAY 90 tablet 1   escitalopram (LEXAPRO) 10 MG tablet TAKE 1 TABLET BY MOUTH EVERY DAY 90 tablet 1   fluticasone (FLONASE) 50 MCG/ACT nasal spray PLACE 2 SPRAYS INTO BOTH NOSTRILS AS NEEDED FOR ALLERGIES OR RHINITIS. 48 mL 1   ibuprofen (ADVIL,MOTRIN) 200 MG tablet Take 400 mg by mouth at bedtime as needed (pain/ sleep).     levocetirizine (XYZAL) 5 MG tablet TAKE 1 TABLET BY MOUTH EVERY DAY 90 tablet 3   Multiple Vitamin (MULTIVITAMIN WITH MINERALS) TABS tablet Take 1 tablet by mouth at  bedtime.     omeprazole (PRILOSEC) 20 MG capsule TAKE 1 CAPSULE BY MOUTH EVERY DAY 90 capsule 3   ondansetron (ZOFRAN ODT) 4 MG disintegrating tablet Take 1 tablet (4 mg total) by mouth every 8 (eight) hours as needed for nausea or vomiting. 20 tablet 0   phenazopyridine (PYRIDIUM) 200 MG tablet Take 1 tablet (200 mg total) by mouth 3 (three) times daily as needed for pain. 6 tablet 0   simvastatin (ZOCOR) 40 MG tablet TAKE 1 TABLET BY MOUTH EVERYDAY AT BEDTIME 90 tablet 1   valsartan-hydrochlorothiazide (DIOVAN-HCT) 80-12.5 MG tablet TAKE 1 TABLET BY MOUTH EVERY DAY 90 tablet 1   Facility-Administered Medications Prior to Visit  Medication Dose Route Frequency Provider Last Rate Last Admin   0.9 %  sodium chloride infusion  500 mL Intravenous Continuous Armbruster, Carlota Raspberry, MD        No Known Allergies  Review of  Systems  Constitutional:  Negative for fever.  HENT:  Negative for congestion, ear pain, hearing loss, sinus pain and sore throat.   Eyes:  Negative for blurred vision and pain.  Respiratory:  Negative for cough, sputum production, shortness of breath and wheezing.   Cardiovascular:  Negative for chest pain and palpitations.  Gastrointestinal:  Negative for blood in stool, constipation, diarrhea, nausea and vomiting.  Genitourinary:  Negative for dysuria, frequency, hematuria and urgency.  Musculoskeletal:  Negative for back pain, falls and myalgias.  Neurological:  Negative for dizziness, sensory change, loss of consciousness, weakness and headaches.  Endo/Heme/Allergies:  Negative for environmental allergies. Does not bruise/bleed easily.  Psychiatric/Behavioral:  Negative for depression and suicidal ideas. The patient is not nervous/anxious and does not have insomnia.       Objective:    Physical Exam Constitutional:      General: She is not in acute distress.    Appearance: Normal appearance. She is not ill-appearing.  HENT:     Head: Normocephalic and atraumatic.     Right Ear: Tympanic membrane, ear canal and external ear normal.     Left Ear: Tympanic membrane, ear canal and external ear normal.  Eyes:     Extraocular Movements: Extraocular movements intact.     Pupils: Pupils are equal, round, and reactive to light.  Cardiovascular:     Rate and Rhythm: Normal rate and regular rhythm.     Pulses: Normal pulses.     Heart sounds: Normal heart sounds. No murmur heard.   No gallop.  Pulmonary:     Effort: Pulmonary effort is normal. No respiratory distress.     Breath sounds: Normal breath sounds. No wheezing, rhonchi or rales.  Abdominal:     General: Bowel sounds are normal. There is no distension.     Palpations: Abdomen is soft. There is no mass.     Tenderness: There is no abdominal tenderness. There is no guarding or rebound.     Hernia: No hernia is present.   Musculoskeletal:     Cervical back: Normal range of motion and neck supple.  Lymphadenopathy:     Cervical: No cervical adenopathy.  Skin:    General: Skin is warm and dry.     Comments: Keratosis on side of nose   Neurological:     Mental Status: She is alert and oriented to person, place, and time.  Psychiatric:        Behavior: Behavior normal.    BP (!) 160/90 (BP Location: Left Arm, Patient Position: Sitting, Cuff Size: Normal)  Pulse 92    Temp 98.7 F (37.1 C) (Oral)    Resp 18    Ht 5' 5.6" (1.666 m)    Wt 222 lb 9.6 oz (101 kg)    LMP  (LMP Unknown)    SpO2 96%    BMI 36.37 kg/m  Wt Readings from Last 3 Encounters:  11/29/21 222 lb 9.6 oz (101 kg)  05/27/21 196 lb (88.9 kg)  01/03/21 197 lb 12.8 oz (89.7 kg)    Diabetic Foot Exam - Simple   No data filed    Lab Results  Component Value Date   WBC 5.7 05/27/2021   HGB 12.0 05/27/2021   HCT 36.2 05/27/2021   PLT 241.0 05/27/2021   GLUCOSE 80 05/27/2021   CHOL 180 05/27/2021   TRIG 141.0 05/27/2021   HDL 64.80 05/27/2021   LDLDIRECT 96.0 04/22/2018   LDLCALC 87 05/27/2021   ALT 19 05/27/2021   AST 18 05/27/2021   NA 142 05/27/2021   K 4.0 05/27/2021   CL 102 05/27/2021   CREATININE 0.92 05/27/2021   BUN 19 05/27/2021   CO2 30 05/27/2021   TSH 1.76 11/17/2019   TSH 1.81 11/17/2019   INR 0.89 06/02/2016   HGBA1C 5.7 02/21/2020    Lab Results  Component Value Date   TSH 1.76 11/17/2019   TSH 1.81 11/17/2019   Lab Results  Component Value Date   WBC 5.7 05/27/2021   HGB 12.0 05/27/2021   HCT 36.2 05/27/2021   MCV 84.5 05/27/2021   PLT 241.0 05/27/2021   Lab Results  Component Value Date   NA 142 05/27/2021   K 4.0 05/27/2021   CO2 30 05/27/2021   GLUCOSE 80 05/27/2021   BUN 19 05/27/2021   CREATININE 0.92 05/27/2021   BILITOT 0.7 05/27/2021   ALKPHOS 64 05/27/2021   AST 18 05/27/2021   ALT 19 05/27/2021   PROT 6.8 05/27/2021   ALBUMIN 4.6 05/27/2021   CALCIUM 9.6 05/27/2021    ANIONGAP 7 06/03/2016   GFR 61.17 05/27/2021   Lab Results  Component Value Date   CHOL 180 05/27/2021   Lab Results  Component Value Date   HDL 64.80 05/27/2021   Lab Results  Component Value Date   LDLCALC 87 05/27/2021   Lab Results  Component Value Date   TRIG 141.0 05/27/2021   Lab Results  Component Value Date   CHOLHDL 3 05/27/2021   Lab Results  Component Value Date   HGBA1C 5.7 02/21/2020        Colonoscopy- Last checked on 09/08/2016. There was diverticulosis in the sigmoid colon. Polyp in the sigmoid colon was resected and retrieved. Anal papillae were hypertrophied otherwise exam was normal.  Repeat in 10 years. Mammogram-  Last checked on 04/09/2021. Results were normal. Repeat in 1 year. Dexa- Last checked on 03/12/2021. Patient is normal according to the [facility]. Repeat in 2 years.  Assessment & Plan:   Problem List Items Addressed This Visit       Unprioritized   Essential hypertension    Well controlled, no changes to meds. Encouraged heart healthy diet such as the DASH diet and exercise as tolerated.       Hyperlipidemia    Encourage heart healthy diet such as MIND or DASH diet, increase exercise, avoid trans fats, simple carbohydrates and processed foods, consider a krill or fish or flaxseed oil cap daily.       Relevant Orders   Comprehensive metabolic panel   Lipid  panel   CBC with Differential/Platelet   Preventative health care - Primary    ghm utd Check labs  See avs      Primary hypertension   Relevant Orders   Comprehensive metabolic panel   Lipid panel   CBC with Differential/Platelet   Other Visit Diagnoses     Need for influenza vaccination       Relevant Orders   Flu Vaccine QUAD High Dose(Fluad) (Completed)       No orders of the defined types were placed in this encounter.   I,Zite Okoli,acting as a Education administrator for [employer], DO.,have documented all relevant documentation on the behalf  of Ann Held, DO,as directed by  Ann Held, DO while in the presence of [place], DO. , personally preformed the services described in this documentation.  All medical record entries made by the scribe were at my direction and in my presence.  I have reviewed the chart and discharge instructions (if applicable) and agree that the record reflects my personal performance and is accurate and complete. 11/29/2021

## 2021-11-29 NOTE — Assessment & Plan Note (Signed)
Encourage heart healthy diet such as MIND or DASH diet, increase exercise, avoid trans fats, simple carbohydrates and processed foods, consider a krill or fish or flaxseed oil cap daily.  °

## 2021-11-29 NOTE — Assessment & Plan Note (Signed)
ghm utd Check labs  See avs  

## 2021-11-29 NOTE — Patient Instructions (Signed)
Preventive Care 65 Years and Older, Female °Preventive care refers to lifestyle choices and visits with your health care provider that can promote health and wellness. Preventive care visits are also called wellness exams. °What can I expect for my preventive care visit? °Counseling °Your health care provider may ask you questions about your: °Medical history, including: °Past medical problems. °Family medical history. °Pregnancy and menstrual history. °History of falls. °Current health, including: °Memory and ability to understand (cognition). °Emotional well-being. °Home life and relationship well-being. °Sexual activity and sexual health. °Lifestyle, including: °Alcohol, nicotine or tobacco, and drug use. °Access to firearms. °Diet, exercise, and sleep habits. °Work and work environment. °Sunscreen use. °Safety issues such as seatbelt and bike helmet use. °Physical exam °Your health care provider will check your: °Height and weight. These may be used to calculate your BMI (body mass index). BMI is a measurement that tells if you are at a healthy weight. °Waist circumference. This measures the distance around your waistline. This measurement also tells if you are at a healthy weight and may help predict your risk of certain diseases, such as type 2 diabetes and high blood pressure. °Heart rate and blood pressure. °Body temperature. °Skin for abnormal spots. °What immunizations do I need? °Vaccines are usually given at various ages, according to a schedule. Your health care provider will recommend vaccines for you based on your age, medical history, and lifestyle or other factors, such as travel or where you work. °What tests do I need? °Screening °Your health care provider may recommend screening tests for certain conditions. This may include: °Lipid and cholesterol levels. °Hepatitis C test. °Hepatitis B test. °HIV (human immunodeficiency virus) test. °STI (sexually transmitted infection) testing, if you are at  risk. °Lung cancer screening. °Colorectal cancer screening. °Diabetes screening. This is done by checking your blood sugar (glucose) after you have not eaten for a while (fasting). °Mammogram. Talk with your health care provider about how often you should have regular mammograms. °BRCA-related cancer screening. This may be done if you have a family history of breast, ovarian, tubal, or peritoneal cancers. °Bone density scan. This is done to screen for osteoporosis. °Talk with your health care provider about your test results, treatment options, and if necessary, the need for more tests. °Follow these instructions at home: °Eating and drinking ° °Eat a diet that includes fresh fruits and vegetables, whole grains, lean protein, and low-fat dairy products. Limit your intake of foods with high amounts of sugar, saturated fats, and salt. °Take vitamin and mineral supplements as recommended by your health care provider. °Do not drink alcohol if your health care provider tells you not to drink. °If you drink alcohol: °Limit how much you have to 0-1 drink a day. °Know how much alcohol is in your drink. In the [place] one drink equals one 12 oz bottle of beer (355 mL), one 5 oz glass of wine (148 mL), or one 1½ oz glass of hard liquor (44 mL). °Lifestyle °Brush your teeth every morning and night with fluoride toothpaste. Floss one time each day. °Exercise for at least 30 minutes 5 or more days each week. °Do not use any products that contain nicotine or tobacco. These products include cigarettes, chewing tobacco, and vaping devices, such as e-cigarettes. If you need help quitting, ask your health care provider. °Do not use drugs. °If you are sexually active, practice safe sex. Use a condom or other form of protection in order to prevent STIs. °Take aspirin only as told by your   health care provider. Make sure that you understand how much to take and what form to take. Work with your health care provider to find out whether it  is safe and beneficial for you to take aspirin daily. Ask your health care provider if you need to take a cholesterol-lowering medicine (statin). Find healthy ways to manage stress, such as: Meditation, yoga, or listening to music. Journaling. Talking to a trusted person. Spending time with friends and family. Minimize exposure to UV radiation to reduce your risk of skin cancer. Safety Always wear your seat belt while driving or riding in a vehicle. Do not drive: If you have been drinking alcohol. Do not ride with someone who has been drinking. When you are tired or distracted. While texting. If you have been using any mind-altering substances or drugs. Wear a helmet and other protective equipment during sports activities. If you have firearms in your house, make sure you follow all gun safety procedures. What's next? Visit your health care provider once a year for an annual wellness visit. Ask your health care provider how often you should have your eyes and teeth checked. Stay up to date on all vaccines. This information is not intended to replace advice given to you by your health care provider. Make sure you discuss any questions you have with your health care provider. Document Revised: 04/17/2021 Document Reviewed: 04/17/2021 Elsevier Patient Education  Robin Wall.

## 2021-11-29 NOTE — Assessment & Plan Note (Signed)
Well controlled, no changes to meds. Encouraged heart healthy diet such as the DASH diet and exercise as tolerated.  °

## 2021-11-30 LAB — CBC WITH DIFFERENTIAL/PLATELET
Absolute Monocytes: 471 cells/uL (ref 200–950)
Basophils Absolute: 31 cells/uL (ref 0–200)
Basophils Relative: 0.7 %
Eosinophils Absolute: 101 cells/uL (ref 15–500)
Eosinophils Relative: 2.3 %
HCT: 33.8 % — ABNORMAL LOW (ref 35.0–45.0)
Hemoglobin: 11.5 g/dL — ABNORMAL LOW (ref 11.7–15.5)
Lymphs Abs: 1294 cells/uL (ref 850–3900)
MCH: 28.7 pg (ref 27.0–33.0)
MCHC: 34 g/dL (ref 32.0–36.0)
MCV: 84.3 fL (ref 80.0–100.0)
MPV: 10.5 fL (ref 7.5–12.5)
Monocytes Relative: 10.7 %
Neutro Abs: 2504 cells/uL (ref 1500–7800)
Neutrophils Relative %: 56.9 %
Platelets: 222 10*3/uL (ref 140–400)
RBC: 4.01 10*6/uL (ref 3.80–5.10)
RDW: 13.3 % (ref 11.0–15.0)
Total Lymphocyte: 29.4 %
WBC: 4.4 10*3/uL (ref 3.8–10.8)

## 2021-11-30 LAB — COMPREHENSIVE METABOLIC PANEL
AG Ratio: 2 (calc) (ref 1.0–2.5)
ALT: 13 U/L (ref 6–29)
AST: 15 U/L (ref 10–35)
Albumin: 4.3 g/dL (ref 3.6–5.1)
Alkaline phosphatase (APISO): 65 U/L (ref 37–153)
BUN: 17 mg/dL (ref 7–25)
CO2: 30 mmol/L (ref 20–32)
Calcium: 9.3 mg/dL (ref 8.6–10.4)
Chloride: 102 mmol/L (ref 98–110)
Creat: 0.87 mg/dL (ref 0.60–1.00)
Globulin: 2.1 g/dL (calc) (ref 1.9–3.7)
Glucose, Bld: 94 mg/dL (ref 65–99)
Potassium: 4.4 mmol/L (ref 3.5–5.3)
Sodium: 139 mmol/L (ref 135–146)
Total Bilirubin: 0.6 mg/dL (ref 0.2–1.2)
Total Protein: 6.4 g/dL (ref 6.1–8.1)

## 2021-11-30 LAB — LIPID PANEL
Cholesterol: 185 mg/dL (ref <200)
HDL: 61 mg/dL (ref >=50)
LDL Cholesterol (Calc): 104 mg/dL (calc) — ABNORMAL HIGH
Non-HDL Cholesterol (Calc): 124 mg/dL (calc) (ref <130)
Total CHOL/HDL Ratio: 3 (calc) (ref <5.0)
Triglycerides: 105 mg/dL (ref <150)

## 2022-04-13 ENCOUNTER — Other Ambulatory Visit: Payer: Self-pay | Admitting: Family Medicine

## 2022-04-23 ENCOUNTER — Other Ambulatory Visit: Payer: Self-pay | Admitting: Family Medicine

## 2022-04-23 DIAGNOSIS — Z1231 Encounter for screening mammogram for malignant neoplasm of breast: Secondary | ICD-10-CM

## 2022-05-02 ENCOUNTER — Ambulatory Visit
Admission: RE | Admit: 2022-05-02 | Discharge: 2022-05-02 | Disposition: A | Discharge status: Home / Self Care | Payer: Federal, State, Local not specified - PPO | Source: Ambulatory Visit | Attending: Family Medicine | Admitting: Family Medicine

## 2022-05-02 DIAGNOSIS — Z1231 Encounter for screening mammogram for malignant neoplasm of breast: Secondary | ICD-10-CM

## 2022-05-29 ENCOUNTER — Encounter: Payer: Self-pay | Admitting: Family Medicine

## 2022-05-29 ENCOUNTER — Ambulatory Visit: Payer: Federal, State, Local not specified - PPO | Admitting: Family Medicine

## 2022-05-29 VITALS — BP 140/70 | HR 99 | Temp 98.2°F | Resp 18 | Ht 65.6 in | Wt 229.4 lb

## 2022-05-29 DIAGNOSIS — I10 Essential (primary) hypertension: Secondary | ICD-10-CM | POA: Diagnosis not present

## 2022-05-29 DIAGNOSIS — E785 Hyperlipidemia, unspecified: Secondary | ICD-10-CM

## 2022-05-29 MED ORDER — AZELASTINE HCL 0.05 % OP SOLN
1.0000 [drp] | Freq: Two times a day (BID) | OPHTHALMIC | 12 refills | Status: DC
Start: 2022-05-29 — End: 2024-02-01

## 2022-05-29 NOTE — Progress Notes (Addendum)
Subjective:   By signing my name below, I, Luiz Ochoa, attest that this documentation has been prepared under the direction and in the presence of Ann Held, DO  05/29/2022     Patient ID: Robin Wall, female    DOB: 1946-04-29, 76 y.o.   MRN: [id number]  Chief Complaint  Patient presents with   Hypertension   Hyperlipidemia   Follow-up    HPI Patient is in today for follow up visit.  Her allergies have been difficult to manage, but she continues to use Flonase for relief. She has been experiencing itchy and watery eyes lately due to weather. Her eyelids have been itchy as well, which she currently treats with eye drops.    Past Medical History:  Diagnosis Date   Allergy    Colitis    history of   GERD (gastroesophageal reflux disease)    Hyperlipidemia    Hypertension    TGA (transient global amnesia)    05/2016    Past Surgical History:  Procedure Laterality Date   CATARACT EXTRACTION Bilateral    TONSILLECTOMY      Family History  Problem Relation Age of Onset   Hypertension Mother    Hyperlipidemia Mother    Diabetes Father    Heart disease Father        chf   Hypertension Father    Hyperlipidemia Father    Coronary artery disease Other    Breast cancer Paternal Grandmother 67   Colon cancer Neg Hx     Social History   Socioeconomic History   Marital status: Married    Spouse name: Not on file   Number of children: 2   Years of education: Not on file   Highest education level: Not on file  Occupational History   Occupation: retired    Fish farm manager: [employer]    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  Tobacco Use   Smoking status: Never   Smokeless tobacco: Never  Substance and Sexual Activity   Alcohol use: No   Drug use: No   Sexual activity: Yes    Partners: Male  Other Topics Concern   Not on file  Social History Narrative   Exercising---walking      Social Determinants of Health   Financial Resource Strain: Not  on file  Food Insecurity: Not on file  Transportation Needs: Not on file  Physical Activity: Not on file  Stress: Not on file  Social Connections: Not on file  Intimate Partner Violence: Not on file    Outpatient Medications Prior to Visit  Medication Sig Dispense Refill   aspirin 81 MG tablet Take 81 mg by mouth daily.     buPROPion (WELLBUTRIN XL) 300 MG 24 hr tablet TAKE 1 TABLET BY MOUTH EVERY DAY 90 tablet 1   escitalopram (LEXAPRO) 10 MG tablet TAKE 1 TABLET BY MOUTH EVERY DAY 90 tablet 1   fluticasone (FLONASE) 50 MCG/ACT nasal spray PLACE 2 SPRAYS INTO BOTH NOSTRILS AS NEEDED FOR ALLERGIES OR RHINITIS. 48 mL 1   ibuprofen (ADVIL,MOTRIN) 200 MG tablet Take 400 mg by mouth at bedtime as needed (pain/ sleep).     levocetirizine (XYZAL) 5 MG tablet TAKE 1 TABLET BY MOUTH EVERY DAY 90 tablet 3   Multiple Vitamin (MULTIVITAMIN WITH MINERALS) TABS tablet Take 1 tablet by mouth at bedtime.     omeprazole (PRILOSEC) 20 MG capsule TAKE 1 CAPSULE BY MOUTH EVERY DAY 90 capsule 3   ondansetron (ZOFRAN ODT)  4 MG disintegrating tablet Take 1 tablet (4 mg total) by mouth every 8 (eight) hours as needed for nausea or vomiting. 20 tablet 0   phenazopyridine (PYRIDIUM) 200 MG tablet Take 1 tablet (200 mg total) by mouth 3 (three) times daily as needed for pain. 6 tablet 0   simvastatin (ZOCOR) 40 MG tablet TAKE 1 TABLET BY MOUTH EVERYDAY AT BEDTIME 90 tablet 1   valsartan-hydrochlorothiazide (DIOVAN-HCT) 80-12.5 MG tablet TAKE 1 TABLET BY MOUTH EVERY DAY 90 tablet 1   Facility-Administered Medications Prior to Visit  Medication Dose Route Frequency Provider Last Rate Last Admin   0.9 %  sodium chloride infusion  500 mL Intravenous Continuous Armbruster, Carlota Raspberry, MD        No Known Allergies  Review of Systems  Constitutional:  Negative for fever and malaise/fatigue.  HENT:  Negative for congestion, sinus pain and sore throat.   Eyes:  Negative for blurred vision.       (+) Itchy eyes and  eyelids (+) Watery eyes  Respiratory:  Negative for cough, shortness of breath and wheezing.   Cardiovascular:  Negative for chest pain, palpitations and leg swelling.  Gastrointestinal:  Negative for abdominal pain, constipation, diarrhea, nausea and vomiting.  Genitourinary:  Negative for dysuria, frequency and hematuria.  Musculoskeletal:  Negative for back pain, joint pain and myalgias.  Skin:  Negative for rash.  Neurological:  Negative for loss of consciousness and headaches.       Objective:    Physical Exam Vitals and nursing note reviewed.  Constitutional:      Appearance: Normal appearance. She is not ill-appearing.  HENT:     Head: Normocephalic and atraumatic.     Right Ear: External ear normal.     Left Ear: External ear normal.  Eyes:     Extraocular Movements: Extraocular movements intact.     Pupils: Pupils are equal, round, and reactive to light.  Cardiovascular:     Rate and Rhythm: Normal rate and regular rhythm.     Pulses: Normal pulses.     Heart sounds: Normal heart sounds. No murmur heard.    No gallop.  Pulmonary:     Effort: Pulmonary effort is normal. No respiratory distress.     Breath sounds: Normal breath sounds. No wheezing or rales.  Neurological:     Mental Status: She is alert and oriented to person, place, and time.  Psychiatric:        Judgment: Judgment normal.     BP 140/70 (BP Location: Left Arm, Patient Position: Sitting, Cuff Size: Large)   Pulse 99   Temp 98.2 F (36.8 C) (Oral)   Resp 18   Ht 5' 5.6" (1.666 m)   Wt 229 lb 6.4 oz (104.1 kg)   LMP  (LMP Unknown)   SpO2 95%   BMI 37.48 kg/m  Wt Readings from Last 3 Encounters:  05/29/22 229 lb 6.4 oz (104.1 kg)  11/29/21 222 lb 9.6 oz (101 kg)  05/27/21 196 lb (88.9 kg)    Diabetic Foot Exam - Simple   No data filed    Lab Results  Component Value Date   WBC 4.4 11/29/2021   HGB 11.5 (L) 11/29/2021   HCT 33.8 (L) 11/29/2021   PLT 222 11/29/2021   GLUCOSE 94  11/29/2021   CHOL 185 11/29/2021   TRIG 105 11/29/2021   HDL 61 11/29/2021   LDLDIRECT 96.0 04/22/2018   LDLCALC 104 (H) 11/29/2021   ALT 13 11/29/2021  AST 15 11/29/2021   NA 139 11/29/2021   K 4.4 11/29/2021   CL 102 11/29/2021   CREATININE 0.87 11/29/2021   BUN 17 11/29/2021   CO2 30 11/29/2021   TSH 1.76 11/17/2019   TSH 1.81 11/17/2019   INR 0.89 06/02/2016   HGBA1C 5.7 02/21/2020    Lab Results  Component Value Date   TSH 1.76 11/17/2019   TSH 1.81 11/17/2019   Lab Results  Component Value Date   WBC 4.4 11/29/2021   HGB 11.5 (L) 11/29/2021   HCT 33.8 (L) 11/29/2021   MCV 84.3 11/29/2021   PLT 222 11/29/2021   Lab Results  Component Value Date   NA 139 11/29/2021   K 4.4 11/29/2021   CO2 30 11/29/2021   GLUCOSE 94 11/29/2021   BUN 17 11/29/2021   CREATININE 0.87 11/29/2021   BILITOT 0.6 11/29/2021   ALKPHOS 64 05/27/2021   AST 15 11/29/2021   ALT 13 11/29/2021   PROT 6.4 11/29/2021   ALBUMIN 4.6 05/27/2021   CALCIUM 9.3 11/29/2021   ANIONGAP 7 06/03/2016   GFR 61.17 05/27/2021   Lab Results  Component Value Date   CHOL 185 11/29/2021   Lab Results  Component Value Date   HDL 61 11/29/2021   Lab Results  Component Value Date   LDLCALC 104 (H) 11/29/2021   Lab Results  Component Value Date   TRIG 105 11/29/2021   Lab Results  Component Value Date   CHOLHDL 3.0 11/29/2021   Lab Results  Component Value Date   HGBA1C 5.7 02/21/2020       Assessment & Plan:   Problem List Items Addressed This Visit       Unprioritized   Primary hypertension - Primary   Relevant Orders   Comprehensive metabolic panel   Lipid panel   Hyperlipidemia    Encourage heart healthy diet such as MIND or DASH diet, increase exercise, avoid trans fats, simple carbohydrates and processed foods, consider a krill or fish or flaxseed oil cap daily.       Relevant Orders   Comprehensive metabolic panel   Lipid panel   Essential hypertension    Well  controlled, no changes to meds. Encouraged heart healthy diet such as the DASH diet and exercise as tolerated.         Meds ordered this encounter  Medications   azelastine (OPTIVAR) 0.05 % ophthalmic solution    Sig: Place 1 drop into both eyes 2 (two) times daily.    Dispense:  6 mL    Refill:  12    I, Ann Held, DO, personally preformed the services described in this documentation.  All medical record entries made by the scribe were at my direction and in my presence.  I have reviewed the chart and discharge instructions (if applicable) and agree that the record reflects my personal performance and is accurate and complete. 05/29/2022    I,Tinashe Williams,acting as a scribe for Ann Held, DO.,have documented all relevant documentation on the behalf of Ann Held, DO,as directed by  Ann Held, DO while in the presence of Ann Held, DO.   Ann Held, DO

## 2022-05-29 NOTE — Assessment & Plan Note (Signed)
Encourage heart healthy diet such as MIND or DASH diet, increase exercise, avoid trans fats, simple carbohydrates and processed foods, consider a krill or fish or flaxseed oil cap daily.  °

## 2022-05-29 NOTE — Patient Instructions (Signed)
Allergies, Adult An allergy is a condition in which the body's defense system (immune system) comes in contact with an allergen and reacts to it. An allergen is anything that causes an allergic reaction. Allergens cause the immune system to make proteins for fighting infections (antibodies). These antibodies cause cells to release chemicals called histamines that set off the symptoms of an allergic reaction. Allergies often affect the nasal passages (allergic rhinitis), eyes (allergic conjunctivitis), skin (atopic dermatitis), and stomach. Allergies can be mild, moderate, or severe. They cannot spread from person to person. Allergies can develop at any age and may be outgrown. What are the causes? This condition is caused by allergens. Common allergens include: Outdoor allergens, such as pollen, car fumes, and mold. Indoor allergens, such as dust, smoke, mold, and pet dander. Other allergens, such as foods, medicines, scents, insect bites or stings, and other skin irritants. What increases the risk? You are more likely to develop this condition if you have: Family members with allergies. Family members who have any condition that may be caused by allergens, such as asthma. This may make you more likely to have other allergies. What are the signs or symptoms? Symptoms of this condition depend on the severity of the allergy. Mild to moderate symptoms Runny nose, stuffy nose (nasal congestion), or sneezing. Itchy mouth, ears, or throat. A feeling of mucus dripping down the back of your throat (postnasal drip). Sore throat. Itchy, red, watery, or puffy eyes. Skin rash, or itchy, red, swollen areas of skin (hives). Stomach cramps or bloating. Severe symptoms Severe allergies to food, medicine, or insect bites may cause anaphylaxis, which can be life-threatening. Symptoms include: A red (flushed) face. Wheezing or coughing. Swollen lips, tongue, or mouth. Tight or swollen throat. Chest pain or  tightness, or rapid heartbeat. Trouble breathing or shortness of breath. Pain in the abdomen, vomiting, or diarrhea. Dizziness or fainting. How is this diagnosed? This condition is diagnosed based on your symptoms, your family and medical history, and a physical exam. You may also have tests, including: Skin tests to see how your skin reacts to allergens that may be causing your symptoms. Tests include: Skin prick test. For this test, an allergen is introduced to your body through a small opening in the skin. Intradermal skin test. For this test, a small amount of allergen is injected under the first layer of your skin. Patch test. For this test, a small amount of allergen is placed on your skin. The area is covered and then checked after a few days. Blood tests. A challenge test. For this test, you will eat or breathe in a small amount of allergen to see if you have an allergic reaction. You may also be asked to: Keep a food diary. This is a record of all the foods, drinks, and symptoms you have in a day. Try an elimination diet. To do this: Remove certain foods from your diet. Add those foods back one by one to find out if any foods cause an allergic reaction. How is this treated?     Treatment for allergies depends on your symptoms. Treatment may include: Cold, wet cloths (cold compresses) to soothe itching and swelling. Eye drops or nasal sprays. Nasal irrigation to help clear your mucus or keep the nasal passages moist. A humidifier to add moisture to the air. Skin creams to treat rashes or itching. Oral antihistamines or other medicines to block the reaction or to treat inflammation. Diet changes to remove foods that cause allergies. Being   exposed again and again to tiny amounts of allergens to help you build a defense against it (tolerance). This is called immunotherapy. Examples include: Allergy shot. You receive an injection that contains an allergen. Sublingual immunotherapy.  You take a small dose of allergen under your tongue. Emergency injection for anaphylaxis. You give yourself a shot using a syringe (auto-injector) that contains the amount of medicine you need. Your health care provider will teach you how to give yourself an injection. Follow these instructions at home: Medicines  Take or apply over-the-counter and prescription medicines only as told by your health care provider. Always carry your auto-injector pen if you are at risk of anaphylaxis. Give yourself an injection as told by your health care provider. Eating and drinking Follow instructions from your health care provider about eating or drinking restrictions. Drink enough fluid to keep your urine pale yellow. General instructions Wear a medical alert bracelet or necklace to let others know that you have had anaphylaxis before. Avoid known allergens whenever possible. Keep all follow-up visits as told by your health care provider. This is important. Contact a health care provider if: Your symptoms do not get better with treatment. Get help right away if: You have symptoms of anaphylaxis. These include: Swollen mouth, tongue, or throat. Pain or tightness in your chest. Trouble breathing or shortness of breath. Dizziness or fainting. Severe abdominal pain, vomiting, or diarrhea. These symptoms may represent a serious problem that is an emergency. Do not wait to see if the symptoms will go away. Get medical help right away. Call your local emergency services (911 in the [place] Do not drive yourself to the hospital. Summary Take or apply over-the-counter and prescription medicines only as told by your health care provider. Avoid known allergens when possible. Always carry your auto-injector pen if you are at risk of anaphylaxis. Give yourself an injection as told by your health care provider. Wear a medical alert bracelet or necklace to let others know that you have had anaphylaxis  before. Anaphylaxis is a life-threatening emergency. Get help right away. This information is not intended to replace advice given to you by your health care provider. Make sure you discuss any questions you have with your health care provider. Document Revised: 06/18/2020 Document Reviewed: 08/31/2019 Elsevier Patient Education  2023 [employer]  

## 2022-05-29 NOTE — Assessment & Plan Note (Signed)
Well controlled, no changes to meds. Encouraged heart healthy diet such as the DASH diet and exercise as tolerated.  °

## 2022-05-30 LAB — COMPREHENSIVE METABOLIC PANEL
ALT: 19 U/L (ref 0–35)
AST: 19 U/L (ref 0–37)
Albumin: 4.7 g/dL (ref 3.5–5.2)
Alkaline Phosphatase: 69 U/L (ref 39–117)
BUN: 20 mg/dL (ref 6–23)
CO2: 28 mEq/L (ref 19–32)
Calcium: 9.4 mg/dL (ref 8.4–10.5)
Chloride: 98 mEq/L (ref 96–112)
Creatinine, Ser: 0.95 mg/dL (ref 0.40–1.20)
GFR: 58.45 mL/min — ABNORMAL LOW (ref >60.00)
Glucose, Bld: 98 mg/dL (ref 70–99)
Potassium: 4.2 mEq/L (ref 3.5–5.1)
Sodium: 137 mEq/L (ref 135–145)
Total Bilirubin: 0.6 mg/dL (ref 0.2–1.2)
Total Protein: 6.9 g/dL (ref 6.0–8.3)

## 2022-05-30 LAB — LIPID PANEL
Cholesterol: 184 mg/dL (ref 0–200)
HDL: 56.6 mg/dL (ref >39.00)
LDL Cholesterol: 90 mg/dL (ref 0–99)
NonHDL: 127.27
Total CHOL/HDL Ratio: 3
Triglycerides: 186 mg/dL — ABNORMAL HIGH (ref 0.0–149.0)
VLDL: 37.2 mg/dL (ref 0.0–40.0)

## 2022-06-02 ENCOUNTER — Other Ambulatory Visit: Payer: Self-pay | Admitting: Family Medicine

## 2022-06-02 DIAGNOSIS — F321 Major depressive disorder, single episode, moderate: Secondary | ICD-10-CM

## 2022-06-02 DIAGNOSIS — I10 Essential (primary) hypertension: Secondary | ICD-10-CM

## 2022-06-02 DIAGNOSIS — E785 Hyperlipidemia, unspecified: Secondary | ICD-10-CM

## 2022-09-30 ENCOUNTER — Other Ambulatory Visit: Payer: Self-pay | Admitting: Family Medicine

## 2022-11-30 ENCOUNTER — Other Ambulatory Visit: Payer: Self-pay | Admitting: Family Medicine

## 2022-11-30 DIAGNOSIS — F321 Major depressive disorder, single episode, moderate: Secondary | ICD-10-CM

## 2022-11-30 DIAGNOSIS — I10 Essential (primary) hypertension: Secondary | ICD-10-CM

## 2022-11-30 DIAGNOSIS — E785 Hyperlipidemia, unspecified: Secondary | ICD-10-CM

## 2022-12-01 ENCOUNTER — Encounter: Payer: Self-pay | Admitting: Family Medicine

## 2022-12-01 ENCOUNTER — Ambulatory Visit (INDEPENDENT_AMBULATORY_CARE_PROVIDER_SITE_OTHER): Payer: Federal, State, Local not specified - PPO | Admitting: Family Medicine

## 2022-12-01 VITALS — BP 140/70 | HR 103 | Temp 98.1°F | Resp 18 | Ht 65.6 in | Wt 233.4 lb

## 2022-12-01 DIAGNOSIS — Z0000 Encounter for general adult medical examination without abnormal findings: Secondary | ICD-10-CM

## 2022-12-01 DIAGNOSIS — I10 Essential (primary) hypertension: Secondary | ICD-10-CM | POA: Diagnosis not present

## 2022-12-01 DIAGNOSIS — F321 Major depressive disorder, single episode, moderate: Secondary | ICD-10-CM | POA: Diagnosis not present

## 2022-12-01 DIAGNOSIS — E785 Hyperlipidemia, unspecified: Secondary | ICD-10-CM

## 2022-12-01 DIAGNOSIS — E2839 Other primary ovarian failure: Secondary | ICD-10-CM

## 2022-12-01 NOTE — Patient Instructions (Signed)
Preventive Care 65 Years and Older, Female Preventive care refers to lifestyle choices and visits with your health care provider that can promote health and wellness. Preventive care visits are also called wellness exams. What can I expect for my preventive care visit? Counseling Your health care provider may ask you questions about your: Medical history, including: Past medical problems. Family medical history. Pregnancy and menstrual history. History of falls. Current health, including: Memory and ability to understand (cognition). Emotional well-being. Home life and relationship well-being. Sexual activity and sexual health. Lifestyle, including: Alcohol, nicotine or tobacco, and drug use. Access to firearms. Diet, exercise, and sleep habits. Work and work environment. Sunscreen use. Safety issues such as seatbelt and bike helmet use. Physical exam Your health care provider will check your: Height and weight. These may be used to calculate your BMI (body mass index). BMI is a measurement that tells if you are at a healthy weight. Waist circumference. This measures the distance around your waistline. This measurement also tells if you are at a healthy weight and may help predict your risk of certain diseases, such as type 2 diabetes and high blood pressure. Heart rate and blood pressure. Body temperature. Skin for abnormal spots. What immunizations do I need?  Vaccines are usually given at various ages, according to a schedule. Your health care provider will recommend vaccines for you based on your age, medical history, and lifestyle or other factors, such as travel or where you work. What tests do I need? Screening Your health care provider may recommend screening tests for certain conditions. This may include: Lipid and cholesterol levels. Hepatitis C test. Hepatitis B test. HIV (human immunodeficiency virus) test. STI (sexually transmitted infection) testing, if you are at  risk. Lung cancer screening. Colorectal cancer screening. Diabetes screening. This is done by checking your blood sugar (glucose) after you have not eaten for a while (fasting). Mammogram. Talk with your health care provider about how often you should have regular mammograms. BRCA-related cancer screening. This may be done if you have a family history of breast, ovarian, tubal, or peritoneal cancers. Bone density scan. This is done to screen for osteoporosis. Talk with your health care provider about your test results, treatment options, and if necessary, the need for more tests. Follow these instructions at home: Eating and drinking  Eat a diet that includes fresh fruits and vegetables, whole grains, lean protein, and low-fat dairy products. Limit your intake of foods with high amounts of sugar, saturated fats, and salt. Take vitamin and mineral supplements as recommended by your health care provider. Do not drink alcohol if your health care provider tells you not to drink. If you drink alcohol: Limit how much you have to 0-1 drink a day. Know how much alcohol is in your drink. In the [place] one drink equals one 12 oz bottle of beer (355 mL), one 5 oz glass of wine (148 mL), or one 1 oz glass of hard liquor (44 mL). Lifestyle Brush your teeth every morning and night with fluoride toothpaste. Floss one time each day. Exercise for at least 30 minutes 5 or more days each week. Do not use any products that contain nicotine or tobacco. These products include cigarettes, chewing tobacco, and vaping devices, such as e-cigarettes. If you need help quitting, ask your health care provider. Do not use drugs. If you are sexually active, practice safe sex. Use a condom or other form of protection in order to prevent STIs. Take aspirin only as told by   your health care provider. Make sure that you understand how much to take and what form to take. Work with your health care provider to find out whether it  is safe and beneficial for you to take aspirin daily. Ask your health care provider if you need to take a cholesterol-lowering medicine (statin). Find healthy ways to manage stress, such as: Meditation, yoga, or listening to music. Journaling. Talking to a trusted person. Spending time with friends and family. Minimize exposure to UV radiation to reduce your risk of skin cancer. Safety Always wear your seat belt while driving or riding in a vehicle. Do not drive: If you have been drinking alcohol. Do not ride with someone who has been drinking. When you are tired or distracted. While texting. If you have been using any mind-altering substances or drugs. Wear a helmet and other protective equipment during sports activities. If you have firearms in your house, make sure you follow all gun safety procedures. What's next? Visit your health care provider once a year for an annual wellness visit. Ask your health care provider how often you should have your eyes and teeth checked. Stay up to date on all vaccines. This information is not intended to replace advice given to you by your health care provider. Make sure you discuss any questions you have with your health care provider. Document Revised: 04/17/2021 Document Reviewed: 04/17/2021 Elsevier Patient Education  2023 Elsevier Inc.  

## 2022-12-01 NOTE — Progress Notes (Shared)
Subjective:   By signing my name below, I, Robin Wall, attest that this documentation has been prepared under the direction and in the presence of Ann Held, DO 12/02/22   Patient ID: Robin Wall, female    DOB: August 24, 1946, 77 y.o.   MRN: [id number]  Chief Complaint  Patient presents with   Annual Exam    Pt states fasting     HPI Patient is in today for CPE. She states that she is doing well overall today.  Her last colonoscopy was done on 09/08/2016. A polyp was removed and biopsied. The biopsy was negative for malignancy and she was advised on a 10 year recall. Her last mammogram was done on 05/02/2022 and was negative for malignancy. She had her last bone density scan on 03/12/2021 with normal results. She is due for a repeat DEXA and mammogram this Summer.  Patient states that she has not been to the eye doctor in the last 2 years. She is UTD on dental exams and last saw her dentist ~6 months ago. She is UTD on her immunizations. She received her RSV, COVID, and flu boosters at her pharmacy on 09/17/22.  She states that her allergies have been manageable this year with her Xyzal and Flonase nasal spray. She notes an occasional dry cough associated with her allergies. She uses cough drops as needed with good relief.  Her reflux symptoms are well controlled with Prilosec. She does have continued occasional abdominal pain. She attributes this to intermittent constipation. She has been eating yogurt daily and a good bit of fiber. She increases this regimen if her constipation occurs.  She is doing well from a mental health perspective on Wellbutrin XL '300mg'$  daily and Lexapro '10mg'$  daily. She denies any SI/HI.  She has been compliant with Zocor '40mg'$  daily. Lab Results  Component Value Date   CHOL 172 12/01/2022   HDL 59.90 12/01/2022   LDLCALC 82 12/01/2022   LDLDIRECT 96.0 04/22/2018   TRIG 148.0 12/01/2022   CHOLHDL 3 12/01/2022   She has been compliant  with Diovan-HCTZ 80-12.'5mg'$  daily. She attributes her elevated BP today to stress while rushing to get to the office. BP Readings from Last 5 Encounters:  12/01/22 (!) 140/70  05/29/22 140/70  11/29/21 (!) 160/90  05/27/21 140/80  01/03/21 124/72    She continues to have intermittent right posterior shoulder pain. She is seeing her chiropractor for this with good relief.   Past Medical History:  Diagnosis Date   Allergy    Colitis    history of   GERD (gastroesophageal reflux disease)    Hyperlipidemia    Hypertension    TGA (transient global amnesia)    05/2016    Past Surgical History:  Procedure Laterality Date   CATARACT EXTRACTION Bilateral    TONSILLECTOMY      Family History  Problem Relation Age of Onset   Hypertension Mother    Hyperlipidemia Mother    Diabetes Father    Heart disease Father        chf   Hypertension Father    Hyperlipidemia Father    Coronary artery disease Other    Breast cancer Paternal Grandmother 19   Colon cancer Neg Hx     Social History   Socioeconomic History   Marital status: Married    Spouse name: Not on file   Number of children: 2   Years of education: Not on file   Highest education level: Not on  file  Occupational History   Occupation: retired    Fish farm manager: [employer]    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  Tobacco Use   Smoking status: Never   Smokeless tobacco: Never  Substance and Sexual Activity   Alcohol use: No   Drug use: No   Sexual activity: Yes    Partners: Male  Other Topics Concern   Not on file  Social History Narrative   Exercising---walking--- needs to do more      Social Determinants of Radio broadcast assistant Strain: Not on file  Food Insecurity: Not on file  Transportation Needs: Not on file  Physical Activity: Not on file  Stress: Not on file  Social Connections: Not on file  Intimate Partner Violence: Not on file    Outpatient Medications Prior to Visit  Medication Sig Dispense  Refill   aspirin 81 MG tablet Take 81 mg by mouth daily.     azelastine (OPTIVAR) 0.05 % ophthalmic solution Place 1 drop into both eyes 2 (two) times daily. 6 mL 12   buPROPion (WELLBUTRIN XL) 300 MG 24 hr tablet TAKE 1 TABLET BY MOUTH EVERY DAY 90 tablet 1   escitalopram (LEXAPRO) 10 MG tablet TAKE 1 TABLET BY MOUTH EVERY DAY 90 tablet 1   fluticasone (FLONASE) 50 MCG/ACT nasal spray PLACE 2 SPRAYS INTO BOTH NOSTRILS AS NEEDED FOR ALLERGIES OR RHINITIS. 48 mL 1   ibuprofen (ADVIL,MOTRIN) 200 MG tablet Take 400 mg by mouth at bedtime as needed (pain/ sleep).     levocetirizine (XYZAL) 5 MG tablet TAKE 1 TABLET BY MOUTH EVERY DAY 90 tablet 3   Multiple Vitamin (MULTIVITAMIN WITH MINERALS) TABS tablet Take 1 tablet by mouth at bedtime.     omeprazole (PRILOSEC) 20 MG capsule Take 1 capsule (20 mg total) by mouth daily. 90 capsule 1   ondansetron (ZOFRAN ODT) 4 MG disintegrating tablet Take 1 tablet (4 mg total) by mouth every 8 (eight) hours as needed for nausea or vomiting. 20 tablet 0   phenazopyridine (PYRIDIUM) 200 MG tablet Take 1 tablet (200 mg total) by mouth 3 (three) times daily as needed for pain. 6 tablet 0   simvastatin (ZOCOR) 40 MG tablet TAKE 1 TABLET BY MOUTH EVERYDAY AT BEDTIME 90 tablet 1   valsartan-hydrochlorothiazide (DIOVAN-HCT) 80-12.5 MG tablet TAKE 1 TABLET BY MOUTH EVERY DAY 90 tablet 1   Facility-Administered Medications Prior to Visit  Medication Dose Route Frequency Provider Last Rate Last Admin   0.9 %  sodium chloride infusion  500 mL Intravenous Continuous Armbruster, Carlota Raspberry, MD        No Known Allergies  Review of Systems  Constitutional:  Negative for fever and malaise/fatigue.  HENT:  Negative for congestion, sinus pain and sore throat.   Eyes:  Negative for blurred vision.  Respiratory:  Positive for cough. Negative for shortness of breath and wheezing.   Cardiovascular:  Negative for chest pain, palpitations and leg swelling.  Gastrointestinal:   Positive for abdominal pain and constipation. Negative for diarrhea, heartburn, nausea and vomiting.  Genitourinary:  Negative for dysuria, frequency and hematuria.  Musculoskeletal:  Positive for joint pain (right shoulder). Negative for back pain and myalgias.  Skin:  Negative for rash.       (-)New moles  Neurological:  Negative for loss of consciousness and headaches.  Endo/Heme/Allergies:  Positive for environmental allergies.  All other systems reviewed and are negative.      Objective:    Physical Exam Vitals  and nursing note reviewed.  Constitutional:      General: She is not in acute distress.    Appearance: Normal appearance. She is not ill-appearing.  HENT:     Head: Normocephalic and atraumatic.     Right Ear: Tympanic membrane, ear canal and external ear normal.     Left Ear: Tympanic membrane, ear canal and external ear normal.  Eyes:     Extraocular Movements: Extraocular movements intact.     Pupils: Pupils are equal, round, and reactive to light.  Cardiovascular:     Rate and Rhythm: Normal rate and regular rhythm.     Heart sounds: Normal heart sounds. No murmur heard.    No gallop.  Pulmonary:     Effort: Pulmonary effort is normal. No respiratory distress.     Breath sounds: Normal breath sounds. No wheezing or rales.  Abdominal:     General: Bowel sounds are normal. There is no distension.     Palpations: Abdomen is soft.     Tenderness: There is no abdominal tenderness. There is no guarding.  Skin:    General: Skin is warm and dry.  Neurological:     Mental Status: She is alert and oriented to person, place, and time.  Psychiatric:        Judgment: Judgment normal.     BP (!) 140/70 (BP Location: Left Arm, Patient Position: Sitting, Cuff Size: Large)   Pulse (!) 103   Temp 98.1 F (36.7 C) (Oral)   Resp 18   Ht 5' 5.6" (1.666 m)   Wt 233 lb 6.4 oz (105.9 kg)   LMP  (LMP Unknown)   SpO2 97%   BMI 38.13 kg/m  Wt Readings from Last 3  Encounters:  12/01/22 233 lb 6.4 oz (105.9 kg)  05/29/22 229 lb 6.4 oz (104.1 kg)  11/29/21 222 lb 9.6 oz (101 kg)       Assessment & Plan:  Preventative health care Assessment & Plan: Ghm utd Check labs  See AVS    Primary hypertension -     CBC with Differential/Platelet -     Comprehensive metabolic panel -     Lipid panel -     TSH  Hyperlipidemia, unspecified hyperlipidemia type Assessment & Plan: Encourage heart healthy diet such as MIND or DASH diet, increase exercise, avoid trans fats, simple carbohydrates and processed foods, consider a krill or fish or flaxseed oil cap daily.    Orders: -     CBC with Differential/Platelet -     Comprehensive metabolic panel -     Lipid panel -     TSH  Depression, major, single episode, moderate (HCC) -     CBC with Differential/Platelet -     Comprehensive metabolic panel -     Lipid panel -     TSH  Estrogen deficiency -     DG Bone Density; Future  Essential hypertension Assessment & Plan: Well controlled, no changes to meds. Encouraged heart healthy diet such as the DASH diet and exercise as tolerated.        I,Alexis Herring,acting as a Education administrator for [employer], DO.,have documented all relevant documentation on the behalf of Ann Held, DO,as directed by  Ann Held, DO while in the presence of Valinda, DO, personally preformed the services described in this documentation.  All medical record entries made by the scribe were at  my direction and in my presence.  I have reviewed the chart and discharge instructions (if applicable) and agree that the record reflects my personal performance and is accurate and complete. 12/02/22   Ann Held, DO

## 2022-12-02 LAB — LIPID PANEL
Cholesterol: 172 mg/dL (ref 0–200)
HDL: 59.9 mg/dL (ref >39.00)
LDL Cholesterol: 82 mg/dL (ref 0–99)
NonHDL: 111.82
Total CHOL/HDL Ratio: 3
Triglycerides: 148 mg/dL (ref 0.0–149.0)
VLDL: 29.6 mg/dL (ref 0.0–40.0)

## 2022-12-02 LAB — COMPREHENSIVE METABOLIC PANEL
ALT: 18 U/L (ref 0–35)
AST: 18 U/L (ref 0–37)
Albumin: 4.4 g/dL (ref 3.5–5.2)
Alkaline Phosphatase: 71 U/L (ref 39–117)
BUN: 12 mg/dL (ref 6–23)
CO2: 29 mEq/L (ref 19–32)
Calcium: 9.2 mg/dL (ref 8.4–10.5)
Chloride: 99 mEq/L (ref 96–112)
Creatinine, Ser: 0.81 mg/dL (ref 0.40–1.20)
GFR: 70.52 mL/min (ref >60.00)
Glucose, Bld: 92 mg/dL (ref 70–99)
Potassium: 4 mEq/L (ref 3.5–5.1)
Sodium: 138 mEq/L (ref 135–145)
Total Bilirubin: 0.6 mg/dL (ref 0.2–1.2)
Total Protein: 6.6 g/dL (ref 6.0–8.3)

## 2022-12-02 LAB — CBC WITH DIFFERENTIAL/PLATELET
Basophils Absolute: 0.1 10*3/uL (ref 0.0–0.1)
Basophils Relative: 0.9 % (ref 0.0–3.0)
Eosinophils Absolute: 0.2 10*3/uL (ref 0.0–0.7)
Eosinophils Relative: 2.6 % (ref 0.0–5.0)
HCT: 35.3 % — ABNORMAL LOW (ref 36.0–46.0)
Hemoglobin: 12.2 g/dL (ref 12.0–15.0)
Lymphocytes Relative: 18.7 % (ref 12.0–46.0)
Lymphs Abs: 1.2 10*3/uL (ref 0.7–4.0)
MCHC: 34.6 g/dL (ref 30.0–36.0)
MCV: 83.7 fl (ref 78.0–100.0)
Monocytes Absolute: 0.5 10*3/uL (ref 0.1–1.0)
Monocytes Relative: 7.8 % (ref 3.0–12.0)
Neutro Abs: 4.4 10*3/uL (ref 1.4–7.7)
Neutrophils Relative %: 70 % (ref 43.0–77.0)
Platelets: 247 10*3/uL (ref 150.0–400.0)
RBC: 4.22 Mil/uL (ref 3.87–5.11)
RDW: 14.5 % (ref 11.5–15.5)
WBC: 6.2 10*3/uL (ref 4.0–10.5)

## 2022-12-02 LAB — TSH: TSH: 2.09 u[IU]/mL (ref 0.35–5.50)

## 2022-12-02 NOTE — Assessment & Plan Note (Signed)
Ghm utd Check labs  See AVS  

## 2022-12-02 NOTE — Assessment & Plan Note (Signed)
Encourage heart healthy diet such as MIND or DASH diet, increase exercise, avoid trans fats, simple carbohydrates and processed foods, consider a krill or fish or flaxseed oil cap daily.  °

## 2022-12-02 NOTE — Assessment & Plan Note (Signed)
Well controlled, no changes to meds. Encouraged heart healthy diet such as the DASH diet and exercise as tolerated.  °

## 2022-12-19 ENCOUNTER — Other Ambulatory Visit: Payer: Self-pay | Admitting: Family Medicine

## 2023-04-06 ENCOUNTER — Other Ambulatory Visit: Payer: Self-pay | Admitting: Family Medicine

## 2023-04-08 ENCOUNTER — Other Ambulatory Visit: Payer: Self-pay | Admitting: Family Medicine

## 2023-04-08 DIAGNOSIS — F321 Major depressive disorder, single episode, moderate: Secondary | ICD-10-CM

## 2023-05-12 ENCOUNTER — Other Ambulatory Visit: Payer: Self-pay | Admitting: Family Medicine

## 2023-05-12 DIAGNOSIS — Z1231 Encounter for screening mammogram for malignant neoplasm of breast: Secondary | ICD-10-CM

## 2023-05-27 ENCOUNTER — Ambulatory Visit
Admission: RE | Admit: 2023-05-27 | Discharge: 2023-05-27 | Disposition: A | Discharge status: Home / Self Care | Payer: Federal, State, Local not specified - PPO | Source: Ambulatory Visit | Attending: Family Medicine | Admitting: Family Medicine

## 2023-05-27 DIAGNOSIS — Z1231 Encounter for screening mammogram for malignant neoplasm of breast: Secondary | ICD-10-CM

## 2023-06-01 ENCOUNTER — Ambulatory Visit: Payer: Federal, State, Local not specified - PPO | Admitting: Family Medicine

## 2023-06-01 ENCOUNTER — Encounter: Payer: Self-pay | Admitting: Family Medicine

## 2023-06-01 VITALS — BP 144/80 | HR 108 | Temp 98.3°F | Resp 18 | Ht 65.5 in | Wt 229.0 lb

## 2023-06-01 DIAGNOSIS — E785 Hyperlipidemia, unspecified: Secondary | ICD-10-CM

## 2023-06-01 DIAGNOSIS — F321 Major depressive disorder, single episode, moderate: Secondary | ICD-10-CM

## 2023-06-01 DIAGNOSIS — I10 Essential (primary) hypertension: Secondary | ICD-10-CM | POA: Diagnosis not present

## 2023-06-01 MED ORDER — ESCITALOPRAM OXALATE 10 MG PO TABS: ORAL_TABLET | ORAL | 1 refills | Status: DC

## 2023-06-01 NOTE — Assessment & Plan Note (Signed)
Inc lexapro 10 mg 1 1/2 tab po every day Con't wellbutrin

## 2023-06-01 NOTE — Progress Notes (Signed)
Established Patient Office Visit  Subjective   Patient ID: Robin Wall, female    DOB: 10-24-1946  Age: 77 y.o. MRN: [id number]  Chief Complaint  Patient presents with   Hypertension   Hyperlipidemia   Follow-up    HPI Discussed the use of AI scribe software for clinical note transcription with the patient, who gave verbal consent to proceed.  History of Present Illness   The patient, with a history of depression and anxiety, presents for a routine follow-up. She reports that her current regimen of Wellbutrin and Lexapro 10mg  daily has been somewhat helpful, but she still struggles with her mental health.  The patient also reports a recent increase in heart rate, which she attributes to rushing and stress from driving. She notes that her heart rate is usually in the 70-80 range, not the elevated rate observed in the office. She agrees to monitor her heart rate at home and report back.  Additionally, the patient has been experiencing dizziness and near syncope in hot weather, despite adequate hydration. She notes that these symptoms are worse when she stands up quickly after bending over. She has been managing these symptoms by resting and avoiding strenuous activity in the heat.      Patient Active Problem List   Diagnosis Date Noted   Primary hypertension 11/29/2021   Right lower quadrant abdominal pain 05/20/2020   Cervical strain, acute, initial encounter 07/19/2018   Depression, major, single episode, moderate (HCC) 02/22/2018   Anxiety 02/22/2018   Preventive measure 12/07/2016   Preventative health care 12/07/2016   GERD (gastroesophageal reflux disease) 06/02/2016   Transient global amnesia 06/02/2016   Depression 06/02/2016   Chronic cholecystitis with calculus 02/15/2014   Hiatal hernia 01/09/2014   Obesity (BMI 30-39.9) 07/28/2013   FATIGUE 07/31/2008   Hyperlipidemia 05/31/2007   COMMON MIGRAINE 05/31/2007   Essential hypertension 05/31/2007   ALLERGIC  RHINITIS 05/31/2007   Past Medical History:  Diagnosis Date   Allergy    Colitis    history of   GERD (gastroesophageal reflux disease)    Hyperlipidemia    Hypertension    TGA (transient global amnesia)    05/2016   Past Surgical History:  Procedure Laterality Date   CATARACT EXTRACTION Bilateral    TONSILLECTOMY     Social History   Tobacco Use   Smoking status: Never   Smokeless tobacco: Never  Substance Use Topics   Alcohol use: No   Drug use: No   Social History   Socioeconomic History   Marital status: Married    Spouse name: Not on file   Number of children: 2   Years of education: Not on file   Highest education level: Not on file  Occupational History   Occupation: retired    Associate Professor: [employer]    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  Tobacco Use   Smoking status: Never   Smokeless tobacco: Never  Substance and Sexual Activity   Alcohol use: No   Drug use: No   Sexual activity: Yes    Partners: Male  Other Topics Concern   Not on file  Social History Narrative   Exercising---walking--- needs to do more      Social Determinants of Corporate investment banker Strain: Not on file  Food Insecurity: Not on file  Transportation Needs: Not on file  Physical Activity: Not on file  Stress: Not on file  Social Connections: Not on file  Intimate Partner Violence: Not on  file   Family Status  Relation Name Status   Mother sally clifton Deceased at age 11   Father  Deceased at age 40   Other  (Not Specified)   PGM  (Not Specified)   Neg Hx  (Not Specified)  No partnership data on file   Family History  Problem Relation Age of Onset   Hypertension Mother    Hyperlipidemia Mother    Diabetes Father    Heart disease Father        chf   Hypertension Father    Hyperlipidemia Father    Coronary artery disease Other    Breast cancer Paternal Grandmother 22   Colon cancer Neg Hx    No Known Allergies    Review of Systems  Constitutional:   Negative for fever and malaise/fatigue.  HENT:  Negative for congestion.   Eyes:  Negative for blurred vision.  Respiratory:  Negative for shortness of breath.   Cardiovascular:  Negative for chest pain, palpitations and leg swelling.  Gastrointestinal:  Negative for abdominal pain, blood in stool and nausea.  Genitourinary:  Negative for dysuria and frequency.  Musculoskeletal:  Negative for falls.  Skin:  Negative for rash.  Neurological:  Negative for dizziness, loss of consciousness and headaches.  Endo/Heme/Allergies:  Negative for environmental allergies.  Psychiatric/Behavioral:  Positive for depression. Negative for hallucinations, memory loss, substance abuse and suicidal ideas. The patient is not nervous/anxious and does not have insomnia.       Objective:     BP (!) 144/80 (BP Location: Left Arm, Patient Position: Sitting, Cuff Size: Large)   Pulse (!) 108   Temp 98.3 F (36.8 C) (Oral)   Resp 18   Ht 5' 5.5" (1.664 m)   Wt 229 lb (103.9 kg)   LMP  (LMP Unknown)   SpO2 96%   BMI 37.53 kg/m  BP Readings from Last 3 Encounters:  06/01/23 (!) 144/80  12/01/22 (!) 140/70  05/29/22 140/70   Wt Readings from Last 3 Encounters:  06/01/23 229 lb (103.9 kg)  12/01/22 233 lb 6.4 oz (105.9 kg)  05/29/22 229 lb 6.4 oz (104.1 kg)   SpO2 Readings from Last 3 Encounters:  06/01/23 96%  12/01/22 97%  05/29/22 95%      Physical Exam Vitals and nursing note reviewed.  Constitutional:      General: She is not in acute distress.    Appearance: Normal appearance. She is well-developed.  HENT:     Head: Normocephalic and atraumatic.  Eyes:     General: No scleral icterus.       Right eye: No discharge.        Left eye: No discharge.  Cardiovascular:     Rate and Rhythm: Normal rate and regular rhythm.     Heart sounds: No murmur heard. Pulmonary:     Effort: Pulmonary effort is normal. No respiratory distress.     Breath sounds: Normal breath sounds.   Musculoskeletal:        General: Normal range of motion.     Cervical back: Normal range of motion and neck supple.     Right lower leg: No edema.     Left lower leg: No edema.  Skin:    General: Skin is warm and dry.  Neurological:     Mental Status: She is alert and oriented to person, place, and time.  Psychiatric:        Mood and Affect: Mood is depressed. Mood is not elated.  Affect is not blunt, angry, tearful or inappropriate.        Behavior: Behavior normal.        Thought Content: Thought content normal.        Judgment: Judgment normal.     No results found for any visits on 06/01/23.  Last CBC Lab Results  Component Value Date   WBC 6.2 12/01/2022   HGB 12.2 12/01/2022   HCT 35.3 (L) 12/01/2022   MCV 83.7 12/01/2022   MCH 28.7 11/29/2021   RDW 14.5 12/01/2022   PLT 247.0 12/01/2022   Last metabolic panel Lab Results  Component Value Date   GLUCOSE 92 12/01/2022   NA 138 12/01/2022   K 4.0 12/01/2022   CL 99 12/01/2022   CO2 29 12/01/2022   BUN 12 12/01/2022   CREATININE 0.81 12/01/2022   GFR 70.52 12/01/2022   CALCIUM 9.2 12/01/2022   PROT 6.6 12/01/2022   ALBUMIN 4.4 12/01/2022   BILITOT 0.6 12/01/2022   ALKPHOS 71 12/01/2022   AST 18 12/01/2022   ALT 18 12/01/2022   ANIONGAP 7 06/03/2016   Last lipids Lab Results  Component Value Date   CHOL 172 12/01/2022   HDL 59.90 12/01/2022   LDLCALC 82 12/01/2022   LDLDIRECT 96.0 04/22/2018   TRIG 148.0 12/01/2022   CHOLHDL 3 12/01/2022   Last hemoglobin A1c Lab Results  Component Value Date   HGBA1C 5.7 02/21/2020   Last thyroid functions Lab Results  Component Value Date   TSH 2.09 12/01/2022   T4TOTAL 7.9 11/17/2019   Last vitamin D No results found for: "25OHVITD2", "25OHVITD3", "VD25OH" Last vitamin B12 and Folate Lab Results  Component Value Date   VITAMINB12 351 07/31/2008   FOLATE 18.8 07/31/2008      The 10-year ASCVD risk score (Arnett DK, et al., 2019) is: 28.5%     Assessment & Plan:   Problem List Items Addressed This Visit       Unprioritized   Primary hypertension - Primary   Relevant Orders   Lipid panel   Vitamin B12   VITAMIN D 25 Hydroxy (Vit-D Deficiency, Fractures)   Hyperlipidemia   Relevant Orders   CBC with Differential/Platelet   Comprehensive metabolic panel   Lipid panel   Vitamin B12   VITAMIN D 25 Hydroxy (Vit-D Deficiency, Fractures)   Essential hypertension    Well controlled, no changes to meds. Encouraged heart healthy diet such as the DASH diet and exercise as tolerated.   SLIGHTLY HIGH today but pt rushed over here--- she will recheck it when she gets home      Depression, major, single episode, moderate (HCC)    Inc lexapro 10 mg 1 1/2 tab po every day Con't wellbutrin       Relevant Medications   escitalopram (LEXAPRO) 10 MG tablet   Other Relevant Orders   Vitamin B12   VITAMIN D 25 Hydroxy (Vit-D Deficiency, Fractures)   Health Maintenance  Topic Date Due   DEXA SCAN  03/13/2023   COVID-19 Vaccine (5 - 2023-24 season) 06/17/2023 (Originally 01/16/2023)   INFLUENZA VACCINE  06/04/2023   MAMMOGRAM  05/26/2024   DTaP/Tdap/Td (4 - Td or Tdap) 11/26/2030   Pneumonia Vaccine 37+ Years old  Completed   Hepatitis C Screening  Completed   Zoster Vaccines- Shingrix  Completed   HPV VACCINES  Aged Out   Colonoscopy  Discontinued     Return in about 6 months (around 12/02/2023), or if symptoms worsen or fail to improve,  for annual exam, fasting.    Donato Schultz, DO

## 2023-06-01 NOTE — Assessment & Plan Note (Signed)
Well controlled, no changes to meds. Encouraged heart healthy diet such as the DASH diet and exercise as tolerated.   SLIGHTLY HIGH today but pt rushed over here--- she will recheck it when she gets home

## 2023-06-01 NOTE — Patient Instructions (Signed)

## 2023-06-02 ENCOUNTER — Encounter: Payer: Self-pay | Admitting: Family Medicine

## 2023-06-03 NOTE — Telephone Encounter (Signed)
FYI

## 2023-06-05 ENCOUNTER — Ambulatory Visit
Admission: RE | Admit: 2023-06-05 | Discharge: 2023-06-05 | Disposition: A | Discharge status: Home / Self Care | Payer: Federal, State, Local not specified - PPO | Source: Ambulatory Visit | Attending: Family Medicine | Admitting: Family Medicine

## 2023-06-05 DIAGNOSIS — E2839 Other primary ovarian failure: Secondary | ICD-10-CM

## 2023-06-07 ENCOUNTER — Other Ambulatory Visit: Payer: Self-pay | Admitting: Family Medicine

## 2023-06-07 DIAGNOSIS — E785 Hyperlipidemia, unspecified: Secondary | ICD-10-CM

## 2023-06-07 DIAGNOSIS — I10 Essential (primary) hypertension: Secondary | ICD-10-CM

## 2023-10-08 ENCOUNTER — Other Ambulatory Visit: Payer: Self-pay | Admitting: Family Medicine

## 2023-12-03 ENCOUNTER — Encounter: Payer: Federal, State, Local not specified - PPO | Admitting: Family Medicine

## 2023-12-07 ENCOUNTER — Other Ambulatory Visit: Payer: Self-pay | Admitting: Family Medicine

## 2023-12-07 ENCOUNTER — Encounter: Payer: Self-pay | Admitting: Family Medicine

## 2023-12-07 ENCOUNTER — Ambulatory Visit (INDEPENDENT_AMBULATORY_CARE_PROVIDER_SITE_OTHER): Payer: Federal, State, Local not specified - PPO | Admitting: Family Medicine

## 2023-12-07 VITALS — BP 158/74 | HR 115 | Ht 65.5 in | Wt 235.2 lb

## 2023-12-07 DIAGNOSIS — E785 Hyperlipidemia, unspecified: Secondary | ICD-10-CM

## 2023-12-07 DIAGNOSIS — Z0000 Encounter for general adult medical examination without abnormal findings: Secondary | ICD-10-CM | POA: Diagnosis not present

## 2023-12-07 DIAGNOSIS — Z23 Encounter for immunization: Secondary | ICD-10-CM | POA: Diagnosis not present

## 2023-12-07 DIAGNOSIS — J302 Other seasonal allergic rhinitis: Secondary | ICD-10-CM

## 2023-12-07 DIAGNOSIS — I10 Essential (primary) hypertension: Secondary | ICD-10-CM | POA: Diagnosis not present

## 2023-12-07 DIAGNOSIS — K219 Gastro-esophageal reflux disease without esophagitis: Secondary | ICD-10-CM

## 2023-12-07 DIAGNOSIS — F321 Major depressive disorder, single episode, moderate: Secondary | ICD-10-CM

## 2023-12-07 MED ORDER — SIMVASTATIN 40 MG PO TABS: 40.0000 mg | ORAL_TABLET | Freq: Every day | ORAL | 1 refills | Status: DC

## 2023-12-07 MED ORDER — FLUTICASONE PROPIONATE 50 MCG/ACT NA SUSP
2.0000 | NASAL | 1 refills | Status: DC | PRN
Start: 2023-12-07 — End: 2024-08-18

## 2023-12-07 MED ORDER — ESCITALOPRAM OXALATE 10 MG PO TABS: ORAL_TABLET | ORAL | 1 refills | Status: AC

## 2023-12-07 MED ORDER — VALSARTAN-HYDROCHLOROTHIAZIDE 80-12.5 MG PO TABS: 1.0000 | ORAL_TABLET | Freq: Every day | ORAL | 1 refills | Status: DC

## 2023-12-07 MED ORDER — OMEPRAZOLE 20 MG PO CPDR: 20.0000 mg | DELAYED_RELEASE_CAPSULE | Freq: Every day | ORAL | 0 refills | Status: DC

## 2023-12-07 MED ORDER — BUPROPION HCL ER (XL) 300 MG PO TB24: 300.0000 mg | ORAL_TABLET | Freq: Every day | ORAL | 1 refills | Status: DC

## 2023-12-07 NOTE — Patient Instructions (Signed)
 Preventive Care 87 Years and Older, Female Preventive care refers to lifestyle choices and visits with your health care provider that can promote health and wellness. Preventive care visits are also called wellness exams. What can I expect for my preventive care visit? Counseling Your health care provider may ask you questions about your: Medical history, including: Past medical problems. Family medical history. Pregnancy and menstrual history. History of falls. Current health, including: Memory and ability to understand (cognition). Emotional well-being. Home life and relationship well-being. Sexual activity and sexual health. Lifestyle, including: Alcohol, nicotine or tobacco, and drug use. Access to firearms. Diet, exercise, and sleep habits. Work and work Astronomer. Sunscreen use. Safety issues such as seatbelt and bike helmet use. Physical exam Your health care provider will check your: Height and weight. These may be used to calculate your BMI (body mass index). BMI is a measurement that tells if you are at a healthy weight. Waist circumference. This measures the distance around your waistline. This measurement also tells if you are at a healthy weight and may help predict your risk of certain diseases, such as type 2 diabetes and high blood pressure. Heart rate and blood pressure. Body temperature. Skin for abnormal spots. What immunizations do I need?  Vaccines are usually given at various ages, according to a schedule. Your health care provider will recommend vaccines for you based on your age, medical history, and lifestyle or other factors, such as travel or where you work. What tests do I need? Screening Your health care provider may recommend screening tests for certain conditions. This may include: Lipid and cholesterol levels. Hepatitis C test. Hepatitis B test. HIV (human immunodeficiency virus) test. STI (sexually transmitted infection) testing, if you are at  risk. Lung cancer screening. Colorectal cancer screening. Diabetes screening. This is done by checking your blood sugar (glucose) after you have not eaten for a while (fasting). Mammogram. Talk with your health care provider about how often you should have regular mammograms. BRCA-related cancer screening. This may be done if you have a family history of breast, ovarian, tubal, or peritoneal cancers. Bone density scan. This is done to screen for osteoporosis. Talk with your health care provider about your test results, treatment options, and if necessary, the need for more tests. Follow these instructions at home: Eating and drinking  Eat a diet that includes fresh fruits and vegetables, whole grains, lean protein, and low-fat dairy products. Limit your intake of foods with high amounts of sugar, saturated fats, and salt. Take vitamin and mineral supplements as recommended by your health care provider. Do not drink alcohol if your health care provider tells you not to drink. If you drink alcohol: Limit how much you have to 0-1 drink a day. Know how much alcohol is in your drink. In the [place] one drink equals one 12 oz bottle of beer (355 mL), one 5 oz glass of wine (148 mL), or one 1 oz glass of hard liquor (44 mL). Lifestyle Brush your teeth every morning and night with fluoride toothpaste. Floss one time each day. Exercise for at least 30 minutes 5 or more days each week. Do not use any products that contain nicotine or tobacco. These products include cigarettes, chewing tobacco, and vaping devices, such as e-cigarettes. If you need help quitting, ask your health care provider. Do not use drugs. If you are sexually active, practice safe sex. Use a condom or other form of protection in order to prevent STIs. Take aspirin only as told by  your health care provider. Make sure that you understand how much to take and what form to take. Work with your health care provider to find out whether it  is safe and beneficial for you to take aspirin daily. Ask your health care provider if you need to take a cholesterol-lowering medicine (statin). Find healthy ways to manage stress, such as: Meditation, yoga, or listening to music. Journaling. Talking to a trusted person. Spending time with friends and family. Minimize exposure to UV radiation to reduce your risk of skin cancer. Safety Always wear your seat belt while driving or riding in a vehicle. Do not drive: If you have been drinking alcohol. Do not ride with someone who has been drinking. When you are tired or distracted. While texting. If you have been using any mind-altering substances or drugs. Wear a helmet and other protective equipment during sports activities. If you have firearms in your house, make sure you follow all gun safety procedures. What's next? Visit your health care provider once a year for an annual wellness visit. Ask your health care provider how often you should have your eyes and teeth checked. Stay up to date on all vaccines. This information is not intended to replace advice given to you by your health care provider. Make sure you discuss any questions you have with your health care provider. Document Revised: 04/17/2021 Document Reviewed: 04/17/2021 Elsevier Patient Education  2024 [employer].

## 2023-12-07 NOTE — Progress Notes (Signed)
Established Patient Office Visit  Subjective   Patient ID: Robin Wall, female    DOB: [birth year]  Age: 78 y.o. MRN: [id number]  Chief Complaint  Patient presents with   Annual Exam    Flu shot     HPI Discussed the use of AI scribe software for clinical note transcription with the patient, who gave verbal consent to proceed.  History of Present Illness   The patient presents for an annual physical exam and blood work.  The patient consumed something before 9 AM, which is relevant for the blood test. She has not received a flu shot or a COVID update this year.  She is currently taking Wellbutrin, Lexapro, omeprazole, and valsartan. Several prescriptions have zero refills and need to be refilled by March. Flonase was recently refilled.  She needs to get back into walking for exercise, as it has been cold recently. She prefers warmer days to get outside more.  She takes ibuprofen occasionally for joint pain, which is effective in managing her symptoms.  No concerns about moles or other skin issues.  There are no changes in her family history, as her family is 'gone'.      Patient Active Problem List   Diagnosis Date Noted   Primary hypertension 11/29/2021   Right lower quadrant abdominal pain 05/20/2020   Cervical strain, acute, initial encounter 07/19/2018   Depression, major, single episode, moderate (HCC) 02/22/2018   Anxiety 02/22/2018   Preventive measure 12/07/2016   Preventative health care 12/07/2016   GERD (gastroesophageal reflux disease) 06/02/2016   Transient global amnesia 06/02/2016   Depression 06/02/2016   Chronic cholecystitis with calculus 02/15/2014   Hiatal hernia 01/09/2014   Obesity (BMI 30-39.9) 07/28/2013   FATIGUE 07/31/2008   Hyperlipidemia 05/31/2007   COMMON MIGRAINE 05/31/2007   Essential hypertension 05/31/2007   ALLERGIC RHINITIS 05/31/2007   Past Medical History:  Diagnosis Date   Allergy    Colitis    history of   GERD  (gastroesophageal reflux disease)    Hyperlipidemia    Hypertension    TGA (transient global amnesia)    05/2016   Past Surgical History:  Procedure Laterality Date   CATARACT EXTRACTION Bilateral    TONSILLECTOMY     Social History   Tobacco Use   Smoking status: Never   Smokeless tobacco: Never  Substance Use Topics   Alcohol use: No   Drug use: No   Social History   Socioeconomic History   Marital status: Married    Spouse name: Not on file   Number of children: 2   Years of education: Not on file   Highest education level: Not on file  Occupational History   Occupation: retired    Associate Professor: [employer]    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  Tobacco Use   Smoking status: Never   Smokeless tobacco: Never  Substance and Sexual Activity   Alcohol use: No   Drug use: No   Sexual activity: Yes    Partners: Male  Other Topics Concern   Not on file  Social History Narrative   Exercising---walking--- needs to do more      Social Drivers of Corporate investment banker Strain: Not on file  Food Insecurity: Not on file  Transportation Needs: Not on file  Physical Activity: Not on file  Stress: Not on file  Social Connections: Not on file  Intimate Partner Violence: Not on file   Family Status  Relation Name Status  Mother sally clifton Deceased at age 53   Father  Deceased at age 17   Other  (Not Specified)   PGM  (Not Specified)   Neg Hx  (Not Specified)  No partnership data on file   Family History  Problem Relation Age of Onset   Hypertension Mother    Hyperlipidemia Mother    Diabetes Father    Heart disease Father        chf   Hypertension Father    Hyperlipidemia Father    Coronary artery disease Other    Breast cancer Paternal Grandmother 77   Colon cancer Neg Hx    No Known Allergies    Review of Systems  Constitutional:  Negative for chills, fever and malaise/fatigue.  HENT:  Negative for congestion and hearing loss.   Eyes:  Negative  for blurred vision and discharge.  Respiratory:  Negative for cough, sputum production and shortness of breath.   Cardiovascular:  Negative for chest pain, palpitations and leg swelling.  Gastrointestinal:  Negative for abdominal pain, blood in stool, constipation, diarrhea, heartburn, nausea and vomiting.  Genitourinary:  Negative for dysuria, frequency, hematuria and urgency.  Musculoskeletal:  Negative for back pain, falls and myalgias.  Skin:  Negative for rash.  Neurological:  Negative for dizziness, sensory change, loss of consciousness, weakness and headaches.  Endo/Heme/Allergies:  Negative for environmental allergies. Does not bruise/bleed easily.  Psychiatric/Behavioral:  Negative for depression and suicidal ideas. The patient is not nervous/anxious and does not have insomnia.       Objective:     BP (!) 158/74 (BP Location: Left Arm, Patient Position: Sitting, Cuff Size: Large)   Pulse (!) 115   Ht 5' 5.5" (1.664 m)   Wt 235 lb 3.2 oz (106.7 kg)   LMP  (LMP Unknown)   SpO2 97%   BMI 38.54 kg/m  BP Readings from Last 3 Encounters:  12/07/23 (!) 158/74  06/01/23 (!) 144/80  12/01/22 (!) 140/70   Wt Readings from Last 3 Encounters:  12/07/23 235 lb 3.2 oz (106.7 kg)  06/01/23 229 lb (103.9 kg)  12/01/22 233 lb 6.4 oz (105.9 kg)   SpO2 Readings from Last 3 Encounters:  12/07/23 97%  06/01/23 96%  12/01/22 97%      Physical Exam Vitals and nursing note reviewed.  Constitutional:      General: She is not in acute distress.    Appearance: Normal appearance. She is well-developed.  HENT:     Head: Normocephalic and atraumatic.     Right Ear: Tympanic membrane, ear canal and external ear normal. There is no impacted cerumen.     Left Ear: Tympanic membrane, ear canal and external ear normal. There is no impacted cerumen.     Nose: Nose normal.     Mouth/Throat:     Mouth: Mucous membranes are moist.     Pharynx: Oropharynx is clear. No oropharyngeal exudate or  posterior oropharyngeal erythema.  Eyes:     General: No scleral icterus.       Right eye: No discharge.        Left eye: No discharge.     Conjunctiva/sclera: Conjunctivae normal.     Pupils: Pupils are equal, round, and reactive to light.  Neck:     Thyroid: No thyromegaly or thyroid tenderness.     Vascular: No JVD.  Cardiovascular:     Rate and Rhythm: Normal rate and regular rhythm.     Heart sounds: Normal heart sounds. No murmur  heard. Pulmonary:     Effort: Pulmonary effort is normal. No respiratory distress.     Breath sounds: Normal breath sounds.  Abdominal:     General: Bowel sounds are normal. There is no distension.     Palpations: Abdomen is soft. There is no mass.     Tenderness: There is no abdominal tenderness. There is no guarding or rebound.  Genitourinary:    Vagina: Normal.  Musculoskeletal:        General: Normal range of motion.     Cervical back: Normal range of motion and neck supple.     Right lower leg: No edema.     Left lower leg: No edema.  Lymphadenopathy:     Cervical: No cervical adenopathy.  Skin:    General: Skin is warm and dry.     Findings: No erythema or rash.  Neurological:     Mental Status: She is alert and oriented to person, place, and time.     Cranial Nerves: No cranial nerve deficit.     Deep Tendon Reflexes: Reflexes are normal and symmetric.  Psychiatric:        Mood and Affect: Mood normal.        Behavior: Behavior normal.        Thought Content: Thought content normal.        Judgment: Judgment normal.      No results found for any visits on 12/07/23.  Last CBC Lab Results  Component Value Date   WBC 7.1 06/01/2023   HGB 12.5 06/01/2023   HCT 37.8 06/01/2023   MCV 86.2 06/01/2023   MCH 28.7 11/29/2021   RDW 14.1 06/01/2023   PLT 251.0 06/01/2023   Last metabolic panel Lab Results  Component Value Date   GLUCOSE 99 06/01/2023   NA 138 06/01/2023   K 4.4 06/01/2023   CL 97 06/01/2023   CO2 30  06/01/2023   BUN 17 06/01/2023   CREATININE 0.94 06/01/2023   GFR 58.78 (L) 06/01/2023   CALCIUM 9.6 06/01/2023   PROT 6.8 06/01/2023   ALBUMIN 4.6 06/01/2023   BILITOT 0.6 06/01/2023   ALKPHOS 74 06/01/2023   AST 15 06/01/2023   ALT 17 06/01/2023   ANIONGAP 7 06/03/2016   Last lipids Lab Results  Component Value Date   CHOL 178 06/01/2023   HDL 65.80 06/01/2023   LDLCALC 82 06/01/2023   LDLDIRECT 96.0 04/22/2018   TRIG 151.0 (H) 06/01/2023   CHOLHDL 3 06/01/2023   Last hemoglobin A1c Lab Results  Component Value Date   HGBA1C 5.7 02/21/2020   Last thyroid functions Lab Results  Component Value Date   TSH 2.09 12/01/2022   T4TOTAL 7.9 11/17/2019   Last vitamin D Lab Results  Component Value Date   VD25OH 33.04 06/01/2023   Last vitamin B12 and Folate Lab Results  Component Value Date   VITAMINB12 241 06/01/2023   FOLATE 18.8 07/31/2008      The 10-year ASCVD risk score (Arnett DK, et al., 2019) is: 37%    Assessment & Plan:   Problem List Items Addressed This Visit       Unprioritized   GERD (gastroesophageal reflux disease)   Relevant Medications   omeprazole (PRILOSEC) 20 MG capsule   Hyperlipidemia   Relevant Medications   simvastatin (ZOCOR) 40 MG tablet   valsartan-hydrochlorothiazide (DIOVAN-HCT) 80-12.5 MG tablet   Other Relevant Orders   CBC with Differential/Platelet   Comprehensive metabolic panel   Lipid panel   TSH  Essential hypertension   Relevant Medications   simvastatin (ZOCOR) 40 MG tablet   valsartan-hydrochlorothiazide (DIOVAN-HCT) 80-12.5 MG tablet   Other Relevant Orders   CBC with Differential/Platelet   Comprehensive metabolic panel   Lipid panel   TSH   Depression, major, single episode, moderate (HCC)   Relevant Medications   buPROPion (WELLBUTRIN XL) 300 MG 24 hr tablet   escitalopram (LEXAPRO) 10 MG tablet   Preventative health care - Primary   Ghm utd Check labs  See AVS  Health Maintenance  Topic Date  Due   COVID-19 Vaccine (5 - 2024-25 season) 07/05/2023   MAMMOGRAM  05/26/2024   DEXA SCAN  06/04/2025   DTaP/Tdap/Td (4 - Td or Tdap) 11/26/2030   Pneumonia Vaccine 23+ Years old  Completed   INFLUENZA VACCINE  Completed   Hepatitis C Screening  Completed   Zoster Vaccines- Shingrix  Completed   HPV VACCINES  Aged Out   Colonoscopy  Discontinued         Other Visit Diagnoses       Seasonal allergies       Relevant Medications   fluticasone (FLONASE) 50 MCG/ACT nasal spray     Need for influenza vaccination       Relevant Orders   Flu Vaccine Trivalent High Dose (Fluad) (Completed)     Assessment and Plan    Influenza Prevention Discussed the current flu season, symptoms such as high fever, body aches, and chills, and stressed the importance of flu vaccination. Emphasized hand hygiene and sanitization to prevent infections. Explained that the flu shot takes 2-3 weeks to become effective. Administer flu shot. Recommend combination COVID and flu test kits for home use. Advise on hand hygiene and sanitization.  Pneumonia Mentioned having pneumonia without further details. Monitor for symptoms or complications.  Joint Pain Occasional joint pain is managed with ibuprofen. Continue ibuprofen as needed.  Medication Management Requires refills for Wellbutrin, Lexapro, omeprazole, valsartan, and Flonase. Refill all medications.  General Health Maintenance Conducted a routine physical examination and discussed vaccinations, regular screenings, and lifestyle modifications. Did not receive flu or COVID updates this year. Administer flu shot. Order blood work including cholesterol and blood pressure tests. Recommend combination COVID and flu test kits from the pharmacy. Encourage regular exercise, specifically walking. Advise regular dental and eye check-ups. Schedule mammogram for July. Bone density test due next year. Tetanus booster not due until 2032.        No follow-ups on  file.    Donato Schultz, DO

## 2023-12-07 NOTE — Assessment & Plan Note (Signed)
Ghm utd Check labs  See AVS  Health Maintenance  Topic Date Due   COVID-19 Vaccine (5 - 2024-25 season) 07/05/2023   MAMMOGRAM  05/26/2024   DEXA SCAN  06/04/2025   DTaP/Tdap/Td (4 - Td or Tdap) 11/26/2030   Pneumonia Vaccine 58+ Years old  Completed   INFLUENZA VACCINE  Completed   Hepatitis C Screening  Completed   Zoster Vaccines- Shingrix  Completed   HPV VACCINES  Aged Out   Colonoscopy  Discontinued

## 2023-12-08 LAB — COMPREHENSIVE METABOLIC PANEL
ALT: 15 U/L (ref 0–35)
AST: 15 U/L (ref 0–37)
Albumin: 4.3 g/dL (ref 3.5–5.2)
Alkaline Phosphatase: 72 U/L (ref 39–117)
BUN: 19 mg/dL (ref 6–23)
CO2: 28 meq/L (ref 19–32)
Calcium: 9.1 mg/dL (ref 8.4–10.5)
Chloride: 100 meq/L (ref 96–112)
Creatinine, Ser: 0.92 mg/dL (ref 0.40–1.20)
GFR: 60.09 mL/min (ref >60.00)
Glucose, Bld: 100 mg/dL — ABNORMAL HIGH (ref 70–99)
Potassium: 4.1 meq/L (ref 3.5–5.1)
Sodium: 140 meq/L (ref 135–145)
Total Bilirubin: 0.5 mg/dL (ref 0.2–1.2)
Total Protein: 6.6 g/dL (ref 6.0–8.3)

## 2023-12-08 LAB — CBC WITH DIFFERENTIAL/PLATELET
Basophils Absolute: 0 10*3/uL (ref 0.0–0.1)
Basophils Relative: 0.9 % (ref 0.0–3.0)
Eosinophils Absolute: 0.2 10*3/uL (ref 0.0–0.7)
Eosinophils Relative: 3.5 % (ref 0.0–5.0)
HCT: 34.8 % — ABNORMAL LOW (ref 36.0–46.0)
Hemoglobin: 11.9 g/dL — ABNORMAL LOW (ref 12.0–15.0)
Lymphocytes Relative: 22.2 % (ref 12.0–46.0)
Lymphs Abs: 1.1 10*3/uL (ref 0.7–4.0)
MCHC: 34.2 g/dL (ref 30.0–36.0)
MCV: 86.5 fL (ref 78.0–100.0)
Monocytes Absolute: 0.4 10*3/uL (ref 0.1–1.0)
Monocytes Relative: 8.7 % (ref 3.0–12.0)
Neutro Abs: 3.2 10*3/uL (ref 1.4–7.7)
Neutrophils Relative %: 64.7 % (ref 43.0–77.0)
Platelets: 231 10*3/uL (ref 150.0–400.0)
RBC: 4.02 Mil/uL (ref 3.87–5.11)
RDW: 14.1 % (ref 11.5–15.5)
WBC: 4.9 10*3/uL (ref 4.0–10.5)

## 2023-12-08 LAB — TSH: TSH: 3.42 u[IU]/mL (ref 0.35–5.50)

## 2023-12-08 LAB — LIPID PANEL
Cholesterol: 190 mg/dL (ref 0–200)
HDL: 59.9 mg/dL (ref >39.00)
LDL Cholesterol: 82 mg/dL (ref 0–99)
NonHDL: 130.19
Total CHOL/HDL Ratio: 3
Triglycerides: 242 mg/dL — ABNORMAL HIGH (ref 0.0–149.0)
VLDL: 48.4 mg/dL — ABNORMAL HIGH (ref 0.0–40.0)

## 2023-12-09 ENCOUNTER — Encounter: Payer: Self-pay | Admitting: Nurse Practitioner

## 2024-01-30 ENCOUNTER — Other Ambulatory Visit: Payer: Self-pay | Admitting: Family Medicine

## 2024-04-25 ENCOUNTER — Other Ambulatory Visit: Payer: Self-pay | Admitting: Family Medicine

## 2024-04-25 DIAGNOSIS — K219 Gastro-esophageal reflux disease without esophagitis: Secondary | ICD-10-CM

## 2024-05-10 ENCOUNTER — Encounter: Payer: Self-pay | Admitting: Family Medicine

## 2024-05-10 ENCOUNTER — Ambulatory Visit: Admitting: Family Medicine

## 2024-05-10 VITALS — BP 138/78 | HR 107 | Temp 98.3°F | Resp 18 | Ht 65.5 in | Wt 238.6 lb

## 2024-05-10 DIAGNOSIS — E785 Hyperlipidemia, unspecified: Secondary | ICD-10-CM | POA: Diagnosis not present

## 2024-05-10 DIAGNOSIS — I10 Essential (primary) hypertension: Secondary | ICD-10-CM | POA: Diagnosis not present

## 2024-05-10 NOTE — Progress Notes (Signed)
 Established Patient Office Visit  Subjective   Patient ID: Robin Wall, female    DOB: 12-May-1946  Age: 78 y.o. MRN: [id number]  Chief Complaint  Patient presents with   Hypertension   Hyperlipidemia   Follow-up    HPI Discussed the use of AI scribe software for clinical note transcription with the patient, who gave verbal consent to proceed.  History of Present Illness Robin Wall is a 78 year old female who presents for medication review and follow-up.  She experienced a spider bite resulting in an itchy, large, red, and swollen area. The spider was not seen at the time of the bite. Symptoms have persisted for a few days but are now almost resolved. She has been using cortisone cream to alleviate the itching.  Her current medications include valsartan , simvastatin , escitalopram , bupropion , Xyzal  (levocetirizine), Flonase , and omeprazole .   Patient Active Problem List   Diagnosis Date Noted   Primary hypertension 11/29/2021   Right lower quadrant abdominal pain 05/20/2020   Cervical strain, acute, initial encounter 07/19/2018   Depression, major, single episode, moderate (HCC) 02/22/2018   Anxiety 02/22/2018   Preventive measure 12/07/2016   Preventative health care 12/07/2016   GERD (gastroesophageal reflux disease) 06/02/2016   Transient global amnesia 06/02/2016   Depression 06/02/2016   Chronic cholecystitis with calculus 02/15/2014   Hiatal hernia 01/09/2014   Obesity (BMI 30-39.9) 07/28/2013   FATIGUE 07/31/2008   Hyperlipidemia 05/31/2007   COMMON MIGRAINE 05/31/2007   Essential hypertension 05/31/2007   ALLERGIC RHINITIS 05/31/2007   Past Medical History:  Diagnosis Date   Allergy    Colitis    history of   GERD (gastroesophageal reflux disease)    Hyperlipidemia    Hypertension    TGA (transient global amnesia)    05/2016   Past Surgical History:  Procedure Laterality Date   CATARACT EXTRACTION Bilateral    TONSILLECTOMY      Social History   Tobacco Use   Smoking status: Never   Smokeless tobacco: Never  Substance Use Topics   Alcohol use: No   Drug use: No   Social History   Socioeconomic History   Marital status: Married    Spouse name: Not on file   Number of children: 2   Years of education: Not on file   Highest education level: Some college, no degree  Occupational History   Occupation: retired    Associate Professor: [employer]    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  Tobacco Use   Smoking status: Never   Smokeless tobacco: Never  Substance and Sexual Activity   Alcohol use: No   Drug use: No   Sexual activity: Yes    Partners: Male  Other Topics Concern   Not on file  Social History Narrative   Exercising---walking--- needs to do more      Social Drivers of Corporate investment banker Strain: Low Risk  (05/08/2024)   Overall Financial Resource Strain (CARDIA)    Difficulty of Paying Living Expenses: Not hard at all  Food Insecurity: No Food Insecurity (05/08/2024)   Hunger Vital Sign    Worried About Running Out of Food in the Last Year: Never true    Ran Out of Food in the Last Year: Never true  Transportation Needs: No Transportation Needs (05/08/2024)   PRAPARE - Administrator, Civil Service (Medical): No    Lack of Transportation (Non-Medical): No  Physical Activity: Insufficiently Active (05/08/2024)   Exercise Vital  Sign    Days of Exercise per Week: 4 days    Minutes of Exercise per Session: 30 min  Stress: No Stress Concern Present (05/08/2024)   [employer] of Occupational Health - Occupational Stress Questionnaire    Feeling of Stress: Only a little  Social Connections: Moderately Isolated (05/08/2024)   Social Connection and Isolation Panel    Frequency of Communication with Friends and Family: More than three times a week    Frequency of Social Gatherings with Friends and Family: Once a week    Attends Religious Services: 1 to 4 times per year    Active Member of  [employer] or Organizations: No    Attends Banker Meetings: Not on file    Marital Status: Widowed  Catering manager Violence: Not on file   Family Status  Relation Name Status   Mother sally clifton Deceased at age 88   Father  Deceased at age 16   Other  (Not Specified)   PGM  (Not Specified)   Neg Hx  (Not Specified)  No partnership data on file   Family History  Problem Relation Age of Onset   Hypertension Mother    Hyperlipidemia Mother    Diabetes Father    Heart disease Father        chf   Hypertension Father    Hyperlipidemia Father    Coronary artery disease Other    Breast cancer Paternal Grandmother 37   Colon cancer Neg Hx    No Known Allergies    Review of Systems  Constitutional:  Negative for fever and malaise/fatigue.  HENT:  Negative for congestion.   Eyes:  Negative for blurred vision.  Respiratory:  Negative for shortness of breath.   Cardiovascular:  Negative for chest pain, palpitations and leg swelling.  Gastrointestinal:  Negative for abdominal pain, blood in stool and nausea.  Genitourinary:  Negative for dysuria and frequency.  Musculoskeletal:  Negative for falls.  Skin:  Negative for rash.  Neurological:  Negative for dizziness, loss of consciousness and headaches.  Endo/Heme/Allergies:  Negative for environmental allergies.  Psychiatric/Behavioral:  Negative for depression. The patient is not nervous/anxious.       Objective:     BP 138/78 (BP Location: Left Arm, Patient Position: Sitting, Cuff Size: Large)   Pulse (!) 107   Temp 98.3 F (36.8 C) (Oral)   Resp 18   Ht 5' 5.5 (1.664 m)   Wt 238 lb 9.6 oz (108.2 kg)   LMP  (LMP Unknown)   SpO2 95%   BMI 39.10 kg/m  BP Readings from Last 3 Encounters:  05/10/24 138/78  12/07/23 (!) 158/74  06/01/23 (!) 144/80   Wt Readings from Last 3 Encounters:  05/10/24 238 lb 9.6 oz (108.2 kg)  12/07/23 235 lb 3.2 oz (106.7 kg)  06/01/23 229 lb (103.9 kg)   SpO2 Readings from  Last 3 Encounters:  05/10/24 95%  12/07/23 97%  06/01/23 96%      Physical Exam Vitals and nursing note reviewed.  Constitutional:      General: She is not in acute distress.    Appearance: Normal appearance. She is well-developed.  HENT:     Head: Normocephalic and atraumatic.  Eyes:     General: No scleral icterus.       Right eye: No discharge.        Left eye: No discharge.  Cardiovascular:     Rate and Rhythm: Normal rate and regular rhythm.  Heart sounds: No murmur heard. Pulmonary:     Effort: Pulmonary effort is normal. No respiratory distress.     Breath sounds: Normal breath sounds.  Musculoskeletal:        General: Normal range of motion.     Cervical back: Normal range of motion and neck supple.     Right lower leg: No edema.     Left lower leg: No edema.  Skin:    General: Skin is warm and dry.  Neurological:     Mental Status: She is alert and oriented to person, place, and time.  Psychiatric:        Mood and Affect: Mood normal.        Behavior: Behavior normal.        Thought Content: Thought content normal.        Judgment: Judgment normal.      No results found for any visits on 05/10/24.  Last CBC Lab Results  Component Value Date   WBC 4.9 12/07/2023   HGB 11.9 (L) 12/07/2023   HCT 34.8 (L) 12/07/2023   MCV 86.5 12/07/2023   MCH 28.7 11/29/2021   RDW 14.1 12/07/2023   PLT 231.0 12/07/2023   Last metabolic panel Lab Results  Component Value Date   GLUCOSE 100 (H) 12/07/2023   NA 140 12/07/2023   K 4.1 12/07/2023   CL 100 12/07/2023   CO2 28 12/07/2023   BUN 19 12/07/2023   CREATININE 0.92 12/07/2023   GFR 60.09 12/07/2023   CALCIUM 9.1 12/07/2023   PROT 6.6 12/07/2023   ALBUMIN 4.3 12/07/2023   BILITOT 0.5 12/07/2023   ALKPHOS 72 12/07/2023   AST 15 12/07/2023   ALT 15 12/07/2023   ANIONGAP 7 06/03/2016   Last lipids Lab Results  Component Value Date   CHOL 190 12/07/2023   HDL 59.90 12/07/2023   LDLCALC 82  12/07/2023   LDLDIRECT 96.0 04/22/2018   TRIG 242.0 (H) 12/07/2023   CHOLHDL 3 12/07/2023   Last hemoglobin A1c Lab Results  Component Value Date   HGBA1C 5.7 02/21/2020   Last thyroid  functions Lab Results  Component Value Date   TSH 3.42 12/07/2023   T4TOTAL 7.9 11/17/2019   Last vitamin D  Lab Results  Component Value Date   VD25OH 33.04 06/01/2023   Last vitamin B12 and Folate Lab Results  Component Value Date   VITAMINB12 241 06/01/2023   FOLATE 18.8 07/31/2008      The 10-year ASCVD risk score (Arnett DK, et al., 2019) is: 29.4%    Assessment & Plan:   Problem List Items Addressed This Visit       Unprioritized   Hyperlipidemia - Primary   Encourage heart healthy diet such as MIND or DASH diet, increase exercise, avoid trans fats, simple carbohydrates and processed foods, consider a krill or fish or flaxseed oil cap daily.        Relevant Orders   CBC with Differential/Platelet   Comprehensive metabolic panel with GFR   Lipid panel   TSH   Essential hypertension   Well controlled, no changes to meds. Encouraged heart healthy diet such as the DASH diet and exercise as tolerated.        Relevant Orders   CBC with Differential/Platelet   Comprehensive metabolic panel with GFR   Lipid panel   TSH  Assessment and Plan Assessment & Plan Spider bite   She reports a spider bite with initial symptoms of itching, and a large, red, swollen area. The  bite is almost resolved. She did not see the spider and realized the bite later. She has been using cortisone cream for itching. Advise use of Benadryl  for itching if needed.  Hypertension   Managed with valsartan .  Hyperlipidemia   Managed with simvastatin .  Depression   Managed with escitalopram  and bupropion .  Allergic rhinitis   Managed with Xyzal  (levocetirizine) and Flonase .  Gastroesophageal reflux disease (GERD)   Managed with omeprazole .  Follow-up   No need for medication refills or  questions. Lab work is planned. Perform lab work.    No follow-ups on file.    Brityn Mastrogiovanni R Lowne Chase, DO

## 2024-05-10 NOTE — Assessment & Plan Note (Signed)
 Encourage heart healthy diet such as MIND or DASH diet, increase exercise, avoid trans fats, simple carbohydrates and processed foods, consider a krill or fish or flaxseed oil cap daily.

## 2024-05-10 NOTE — Assessment & Plan Note (Signed)
 Well controlled, no changes to meds. Encouraged heart healthy diet such as the DASH diet and exercise as tolerated.

## 2024-05-11 LAB — LIPID PANEL
Cholesterol: 187 mg/dL (ref 0–200)
HDL: 60.7 mg/dL (ref >39.00)
LDL Cholesterol: 89 mg/dL (ref 0–99)
NonHDL: 126.53
Total CHOL/HDL Ratio: 3
Triglycerides: 189 mg/dL — ABNORMAL HIGH (ref 0.0–149.0)
VLDL: 37.8 mg/dL (ref 0.0–40.0)

## 2024-05-11 LAB — CBC WITH DIFFERENTIAL/PLATELET
Basophils Absolute: 0.1 K/uL (ref 0.0–0.1)
Basophils Relative: 0.9 % (ref 0.0–3.0)
Eosinophils Absolute: 0.2 K/uL (ref 0.0–0.7)
Eosinophils Relative: 3.2 % (ref 0.0–5.0)
HCT: 37 % (ref 36.0–46.0)
Hemoglobin: 12.4 g/dL (ref 12.0–15.0)
Lymphocytes Relative: 16.6 % (ref 12.0–46.0)
Lymphs Abs: 1 K/uL (ref 0.7–4.0)
MCHC: 33.4 g/dL (ref 30.0–36.0)
MCV: 83.7 fl (ref 78.0–100.0)
Monocytes Absolute: 0.5 K/uL (ref 0.1–1.0)
Monocytes Relative: 8.7 % (ref 3.0–12.0)
Neutro Abs: 4.4 K/uL (ref 1.4–7.7)
Neutrophils Relative %: 70.6 % (ref 43.0–77.0)
Platelets: 240 K/uL (ref 150.0–400.0)
RBC: 4.42 Mil/uL (ref 3.87–5.11)
RDW: 14.6 % (ref 11.5–15.5)
WBC: 6.2 K/uL (ref 4.0–10.5)

## 2024-05-11 LAB — COMPREHENSIVE METABOLIC PANEL WITH GFR
ALT: 17 U/L (ref 0–35)
AST: 19 U/L (ref 0–37)
Albumin: 4.7 g/dL (ref 3.5–5.2)
Alkaline Phosphatase: 81 U/L (ref 39–117)
BUN: 18 mg/dL (ref 6–23)
CO2: 31 meq/L (ref 19–32)
Calcium: 9.3 mg/dL (ref 8.4–10.5)
Chloride: 99 meq/L (ref 96–112)
Creatinine, Ser: 0.99 mg/dL (ref 0.40–1.20)
GFR: 54.87 mL/min — ABNORMAL LOW (ref >60.00)
Glucose, Bld: 95 mg/dL (ref 70–99)
Potassium: 4.4 meq/L (ref 3.5–5.1)
Sodium: 141 meq/L (ref 135–145)
Total Bilirubin: 0.8 mg/dL (ref 0.2–1.2)
Total Protein: 6.9 g/dL (ref 6.0–8.3)

## 2024-05-11 LAB — TSH: TSH: 2.56 u[IU]/mL (ref 0.35–5.50)

## 2024-05-12 ENCOUNTER — Ambulatory Visit: Payer: Self-pay | Admitting: Family Medicine

## 2024-08-08 ENCOUNTER — Other Ambulatory Visit: Payer: Self-pay | Admitting: Family Medicine

## 2024-08-08 DIAGNOSIS — Z1231 Encounter for screening mammogram for malignant neoplasm of breast: Secondary | ICD-10-CM

## 2024-08-17 ENCOUNTER — Other Ambulatory Visit: Payer: Self-pay | Admitting: Family Medicine

## 2024-08-17 DIAGNOSIS — I10 Essential (primary) hypertension: Secondary | ICD-10-CM

## 2024-08-17 DIAGNOSIS — J302 Other seasonal allergic rhinitis: Secondary | ICD-10-CM

## 2024-08-17 DIAGNOSIS — K219 Gastro-esophageal reflux disease without esophagitis: Secondary | ICD-10-CM

## 2024-08-17 DIAGNOSIS — F321 Major depressive disorder, single episode, moderate: Secondary | ICD-10-CM

## 2024-08-17 DIAGNOSIS — E785 Hyperlipidemia, unspecified: Secondary | ICD-10-CM

## 2024-08-25 ENCOUNTER — Ambulatory Visit

## 2024-09-16 ENCOUNTER — Ambulatory Visit

## 2024-10-12 ENCOUNTER — Ambulatory Visit

## 2024-11-09 ENCOUNTER — Ambulatory Visit

## 2024-11-15 ENCOUNTER — Ambulatory Visit: Admitting: Family Medicine

## 2024-11-21 ENCOUNTER — Ambulatory Visit: Admitting: Family Medicine

## 2024-11-22 ENCOUNTER — Ambulatory Visit

## 2024-11-29 ENCOUNTER — Ambulatory Visit: Admitting: Family Medicine

## 2024-12-23 ENCOUNTER — Ambulatory Visit: Admitting: Family Medicine
